# Patient Record
Sex: Female | Born: 1935 | Race: White | Hispanic: No | Smoking: Former smoker
Health system: Southern US, Community
[De-identification: ages and names within clinical notes are randomized; demographics above are authoritative.]

## PROBLEM LIST (undated history)

## (undated) DIAGNOSIS — H269 Unspecified cataract: Secondary | ICD-10-CM

## (undated) DIAGNOSIS — E785 Hyperlipidemia, unspecified: Secondary | ICD-10-CM

## (undated) DIAGNOSIS — L509 Urticaria, unspecified: Secondary | ICD-10-CM

## (undated) DIAGNOSIS — F419 Anxiety disorder, unspecified: Secondary | ICD-10-CM

## (undated) DIAGNOSIS — M1990 Unspecified osteoarthritis, unspecified site: Secondary | ICD-10-CM

## (undated) DIAGNOSIS — I10 Essential (primary) hypertension: Secondary | ICD-10-CM

## (undated) DIAGNOSIS — E039 Hypothyroidism, unspecified: Secondary | ICD-10-CM

## (undated) HISTORY — DX: Anxiety disorder, unspecified: F41.9

## (undated) HISTORY — DX: Unspecified cataract: H26.9

## (undated) HISTORY — PX: BREAST SURGERY: SHX581

## (undated) HISTORY — DX: Urticaria, unspecified: L50.9

## (undated) HISTORY — PX: EYE SURGERY: SHX253

## (undated) HISTORY — PX: ABDOMINAL HYSTERECTOMY: SHX81

## (undated) HISTORY — PX: CYSTECTOMY: SUR359

---

## 2011-09-23 ENCOUNTER — Emergency Department (HOSPITAL_COMMUNITY)
Admission: EM | Admit: 2011-09-23 | Discharge: 2011-09-24 | Disposition: A | Discharge status: Home / Self Care | Payer: Medicare Other | Attending: Emergency Medicine | Admitting: Emergency Medicine

## 2011-09-23 ENCOUNTER — Encounter (HOSPITAL_COMMUNITY): Payer: Self-pay | Admitting: *Deleted

## 2011-09-23 ENCOUNTER — Emergency Department (HOSPITAL_COMMUNITY): Payer: Medicare Other

## 2011-09-23 DIAGNOSIS — S01119A Laceration without foreign body of unspecified eyelid and periocular area, initial encounter: Secondary | ICD-10-CM | POA: Insufficient documentation

## 2011-09-23 DIAGNOSIS — W010XXA Fall on same level from slipping, tripping and stumbling without subsequent striking against object, initial encounter: Secondary | ICD-10-CM | POA: Insufficient documentation

## 2011-09-23 DIAGNOSIS — R51 Headache: Secondary | ICD-10-CM | POA: Insufficient documentation

## 2011-09-23 DIAGNOSIS — S0990XA Unspecified injury of head, initial encounter: Secondary | ICD-10-CM

## 2011-09-23 DIAGNOSIS — S0180XA Unspecified open wound of other part of head, initial encounter: Secondary | ICD-10-CM | POA: Insufficient documentation

## 2011-09-23 DIAGNOSIS — E039 Hypothyroidism, unspecified: Secondary | ICD-10-CM | POA: Insufficient documentation

## 2011-09-23 DIAGNOSIS — E785 Hyperlipidemia, unspecified: Secondary | ICD-10-CM | POA: Insufficient documentation

## 2011-09-23 DIAGNOSIS — M542 Cervicalgia: Secondary | ICD-10-CM | POA: Insufficient documentation

## 2011-09-23 DIAGNOSIS — I10 Essential (primary) hypertension: Secondary | ICD-10-CM | POA: Insufficient documentation

## 2011-09-23 DIAGNOSIS — Y92009 Unspecified place in unspecified non-institutional (private) residence as the place of occurrence of the external cause: Secondary | ICD-10-CM | POA: Insufficient documentation

## 2011-09-23 HISTORY — DX: Hypothyroidism, unspecified: E03.9

## 2011-09-23 HISTORY — DX: Essential (primary) hypertension: I10

## 2011-09-23 HISTORY — DX: Hyperlipidemia, unspecified: E78.5

## 2011-09-23 MED ORDER — ACETAMINOPHEN 325 MG PO TABS
650.0000 mg | ORAL_TABLET | Freq: Once | ORAL | Status: AC
  Administered 2011-09-23: 650 mg via ORAL
  Filled 2011-09-23: qty 2

## 2011-09-23 NOTE — ED Notes (Signed)
Pt states head starting to hurt worse, advised would speak to PA about getting something for pain.

## 2011-09-23 NOTE — ED Notes (Addendum)
Pt states tripped & hit a wall. Denies any LOC or vision changes. PERRLA. Denies any neck pain.

## 2011-09-23 NOTE — ED Notes (Addendum)
Pt report falling over a throw rug. Laceration over left eye. Bruising noted to eye.  Denies dizziness or blurred vision.

## 2011-09-26 NOTE — ED Provider Notes (Signed)
History     CSN: [id number]  Arrival date & time 09/23/11  1946   First MD Initiated Contact with Patient 09/23/11 2118      Chief Complaint  Patient presents with  . Fall    (Consider location/radiation/quality/duration/timing/severity/associated sxs/prior treatment) Patient is a 78 y.o. female presenting with fall. The history is provided by the patient.  Fall The accident occurred 1 to 2 hours ago. The fall occurred while walking. Distance fallen: She tripped over a throw rug in her home,  and a wall with her forehead. The volume of blood lost was minimal. The point of impact was the head. The pain is present in the head. The pain is at a severity of 6/10. The pain is moderate. She was ambulatory at the scene. There was no entrapment after the fall. There was no alcohol use involved in the accident. Associated symptoms include headaches. Pertinent negatives include no visual change, no fever, no numbness, no abdominal pain, no nausea, no vomiting, no loss of consciousness and no tingling. The symptoms are aggravated by pressure on the injury. She has tried ice for the symptoms. The treatment provided moderate relief.    Past Medical History  Diagnosis Date  . Hypertension   . Hypothyroid   . Hyperlipemia     Past Surgical History  Procedure Date  . Abdominal hysterectomy   . Cystectomy     History reviewed. No pertinent family history.  History  Substance Use Topics  . Smoking status: Never Smoker   . Smokeless tobacco: Not on file  . Alcohol Use: No    OB History    Grav Para Term Preterm Abortions TAB SAB Ect Mult Living                  Review of Systems  Constitutional: Negative for fever.  HENT: Negative for congestion, sore throat and neck pain.   Eyes: Negative.  Negative for pain and visual disturbance.  Respiratory: Negative for chest tightness and shortness of breath.   Cardiovascular: Negative for chest pain.  Gastrointestinal: Negative for nausea,  vomiting and abdominal pain.  Genitourinary: Negative.   Musculoskeletal: Negative for joint swelling and arthralgias.  Skin: Positive for wound. Negative for rash.  Neurological: Positive for headaches. Negative for dizziness, tingling, loss of consciousness, syncope, weakness, light-headedness and numbness.  Hematological: Negative.   Psychiatric/Behavioral: Negative.     Allergies  Review of patient's allergies indicates no known allergies.  Home Medications   Current Outpatient Rx  Name Route Sig Dispense Refill  . CALCIUM PO Oral Take 1 tablet by mouth daily.    Marland Kitchen GLUCOSAMINE HCL PO Oral Take 1 tablet by mouth daily.    Marland Kitchen LEVOTHYROXINE SODIUM 75 MCG PO TABS Oral Take 75 mcg by mouth every morning.     Marland Kitchen METOPROLOL TARTRATE 50 MG PO TABS Oral Take 50 mg by mouth 2 (two) times daily.    . ADULT MULTIVITAMIN W/MINERALS CH Oral Take 1 tablet by mouth daily.    Marland Kitchen SIMVASTATIN 40 MG PO TABS Oral Take 40 mg by mouth at bedtime.      BP 154/72  Pulse 55  Temp 97.7 F (36.5 C) (Oral)  Resp 16  Ht 5\' 3"  (1.6 m)  Wt 149 lb (67.586 kg)  BMI 26.39 kg/m2  SpO2 99%  Physical Exam  Nursing note and vitals reviewed. Constitutional: She is oriented to person, place, and time. She appears well-developed and well-nourished.  HENT:  Head: Normocephalic. Head is  with contusion and with laceration.  Right Ear: Tympanic membrane and ear canal normal. No hemotympanum.  Left Ear: Tympanic membrane and ear canal normal. No hemotympanum.  Nose: Nose normal.  Mouth/Throat: Oropharynx is clear and moist.       0.5 cm laceration left upper eyelid, hemostatic.  Left forehead hematoma with left upper eyelid edema and early ecchymosisi.  Eyes: Conjunctivae and EOM are normal. Pupils are equal, round, and reactive to light.         Visual acuity os 20/50 od 20/70  Neck: Normal range of motion. Neck supple. Muscular tenderness present. No spinous process tenderness present.  Cardiovascular: Normal  rate and normal heart sounds.   Pulmonary/Chest: Effort normal.  Abdominal: Soft. There is no tenderness.  Musculoskeletal: Normal range of motion.  Lymphadenopathy:    She has no cervical adenopathy.  Neurological: She is alert and oriented to person, place, and time. She has normal strength. No sensory deficit. Gait normal. GCS eye subscore is 4. GCS verbal subscore is 5. GCS motor subscore is 6.       Normal heel-shin, normal rapid alternating movements. Cranial nerves III-XII intact.  No pronator drift.  Skin: Skin is warm and dry. No rash noted.  Psychiatric: She has a normal mood and affect. Her speech is normal and behavior is normal. Thought content normal. Cognition and memory are normal.    ED Course  Procedures (including critical care time)  Labs Reviewed - No data to display No results found.   No results found for this or any previous visit.   1. Minor head injury   2. Laceration of eyebrow    LACERATION REPAIR Performed by: Burgess Amor Authorized by: Burgess Amor Consent: Verbal consent obtained. Risks and benefits: risks, benefits and alternatives were discussed Consent given by: patient Patient identity confirmed: provided demographic data Prepped and Draped in normal sterile fashion Wound explored  Laceration Location: left upper eyelide  Laceration Length: 0.5 cm  No Foreign Bodies seen or palpated  Anesthesia: local infiltration  Local anesthetic:n/a Anesthetic total:na Irrigation method: syringe Amount of cleaning: standard  Skin closure: dermabond  Number of sutures:dermabond Technique: Patient tolerance: Patient tolerated the procedure well with no immediate complications.  Offered pt pain medication,  Deferred.  Ice pack given while waiting for ct studies.  MDM  Ct scans reviewed prior to dc home.  Pt alert , oriented at time of dc.  Given tylenol prior to dc home.  Pt seen by Dr. Adriana Simas prior to dc.  No signs, sx suggestive of concussion  or other significant trauma from trauma.          Burgess Amor, PA 09/26/11 0932  Burgess Amor, PA 09/26/11 [phone]

## 2011-09-28 NOTE — ED Provider Notes (Signed)
Medical screening examination/treatment/procedure(s) were conducted as a shared visit with non-physician practitioner(s) and myself.  I personally evaluated the patient during the encounter.  No loss of consciousness or neurological deficits. Laceration repair per physician assistant.  Donnetta Hutching, MD 09/28/11 [phone]

## 2014-01-15 IMAGING — CT CT CERVICAL SPINE W/O CM
4 of 5 series · 13 of 33 positions shown, 15 images · non-contrast
Comparison: None

CT HEAD

CLINICAL DATA: Fall.  Bruise.  Headache and neck pain.

CT HEAD WITHOUT CONTRAST
CT CERVICAL SPINE WITHOUT CONTRAST
TECHNIQUE: Multidetector CT imaging of the head and cervical spine
was performed following the standard protocol without intravenous
contrast.  Multiplanar CT image reconstructions of the cervical
spine were also generated.

[Series 2: headseq 4.8 h37s · axial · 0.47mm/px · z∈[+217,+398]mm · 3 of 36 slices shown, 4 images]
[im 1/36  soft-tissue]
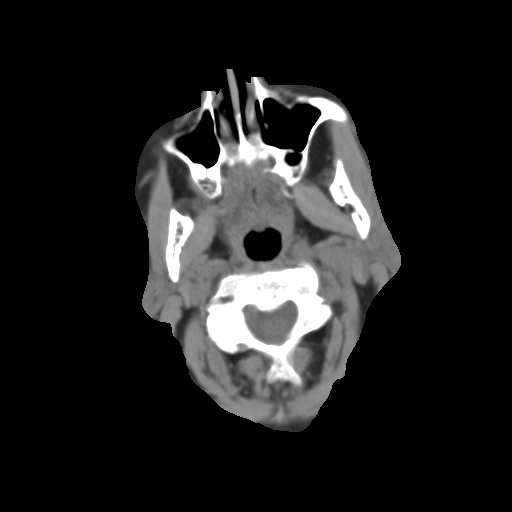
[im 1/36  bone]
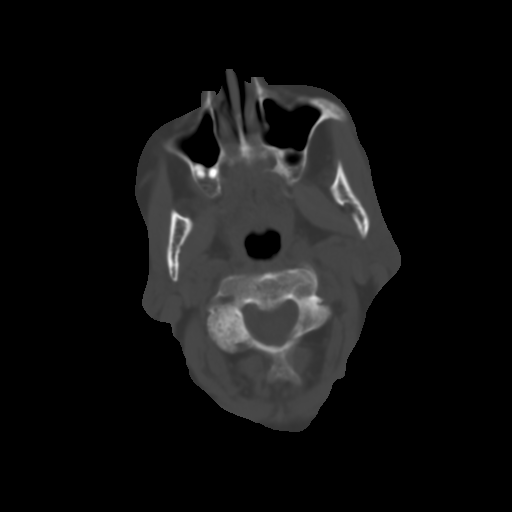
[im 18/36  bone]
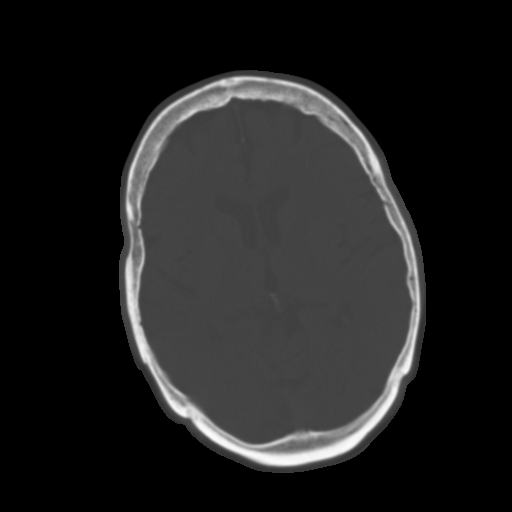
[im 36/36  bone]
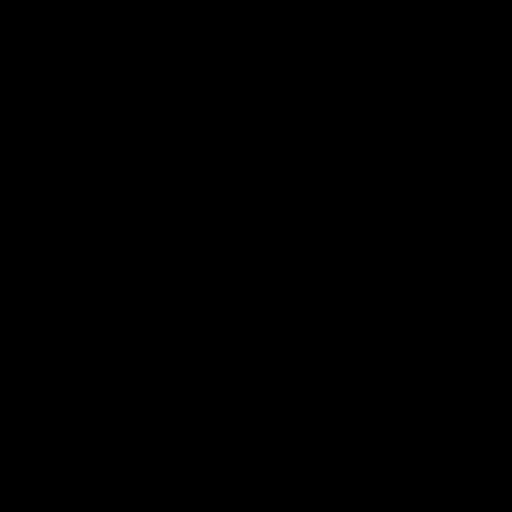

[Series 8: sagittal bone 2.0 · sagittal · 0.20mm/px · 5 of 36 slices shown, 6 images]
[im 12/36  bone]
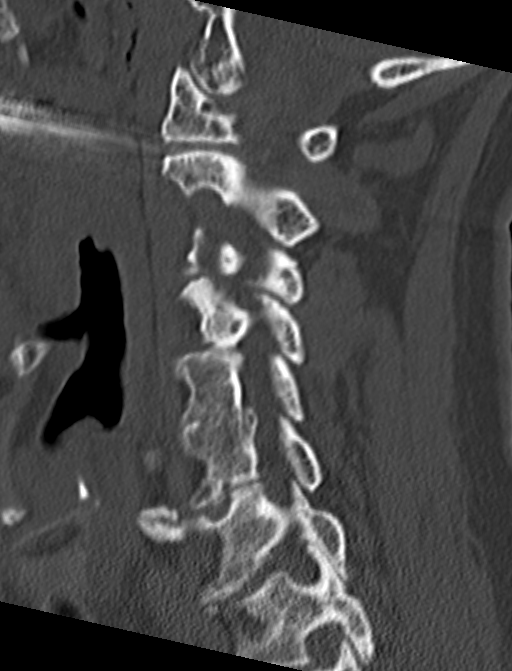
[im 15/36  bone]
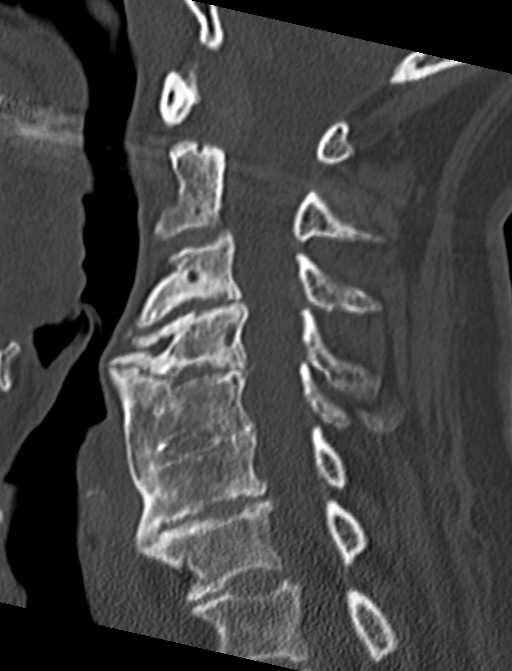
[im 18/36  soft-tissue]
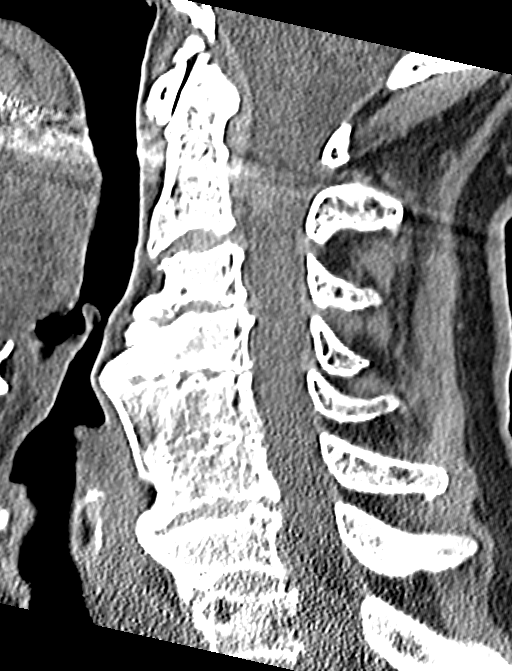
[im 18/36  bone]
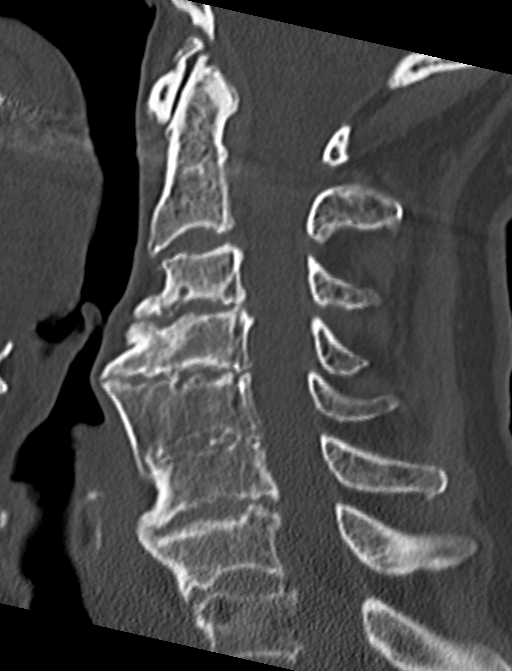
[im 21/36  bone]
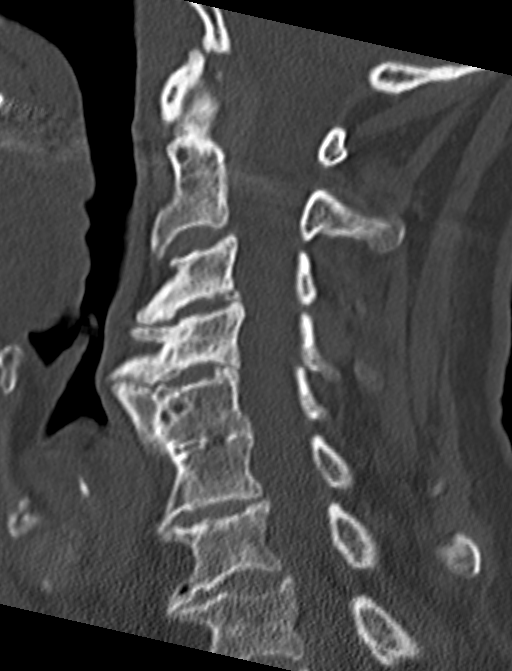
[im 24/36  bone]
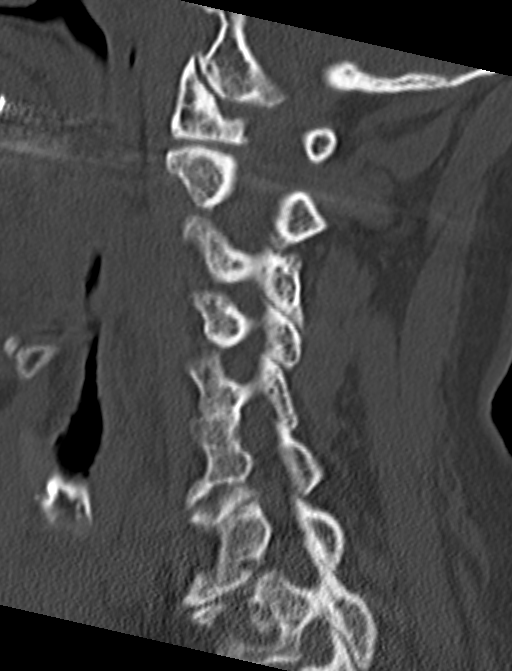

[Series 9: coronal bone 2.0 · coronal · 0.16mm/px · 3 of 51 slices shown]
[im 11/51  bone]
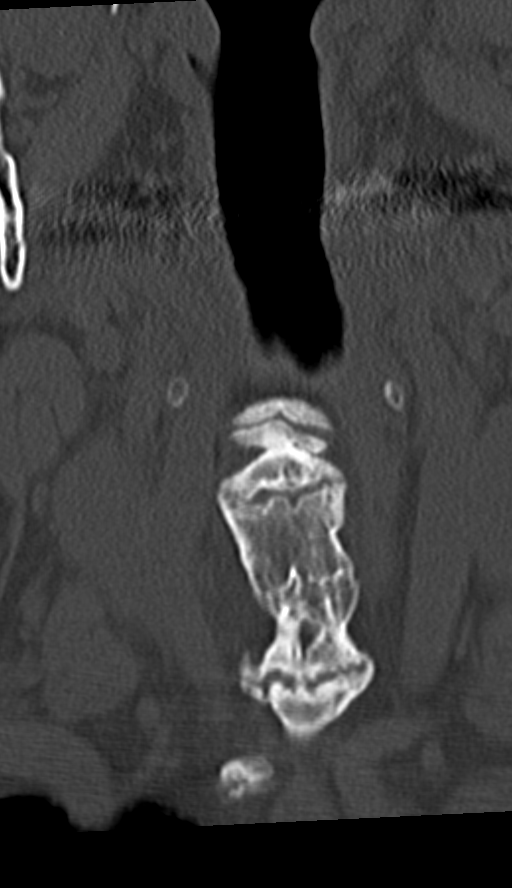
[im 21/51  bone]
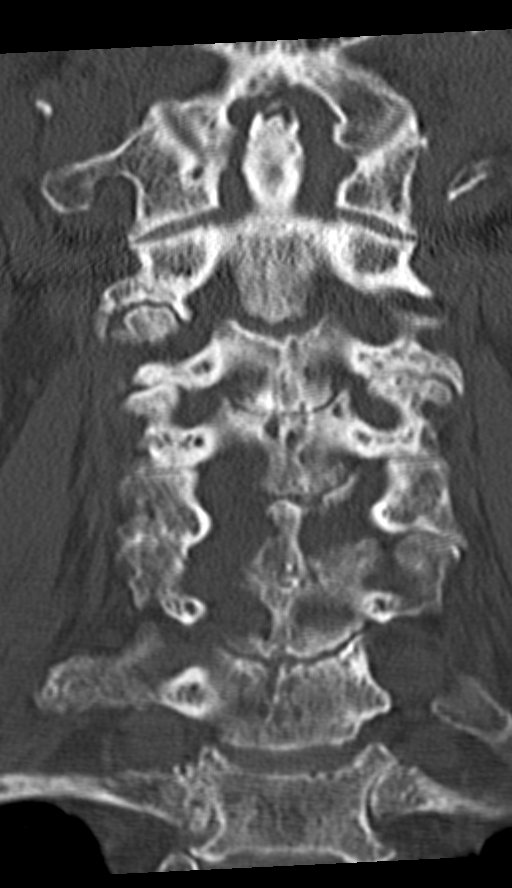
[im 31/51  bone]
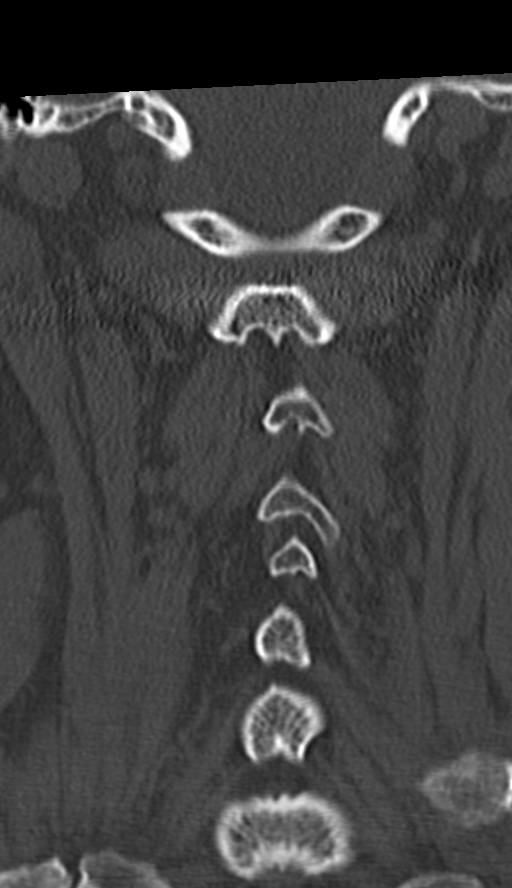

[Series 10: axial bone 2.0 · axial · 0.16mm/px · z∈[+82,+108]mm · 2 of 70 slices shown]
[im 14/70  bone]
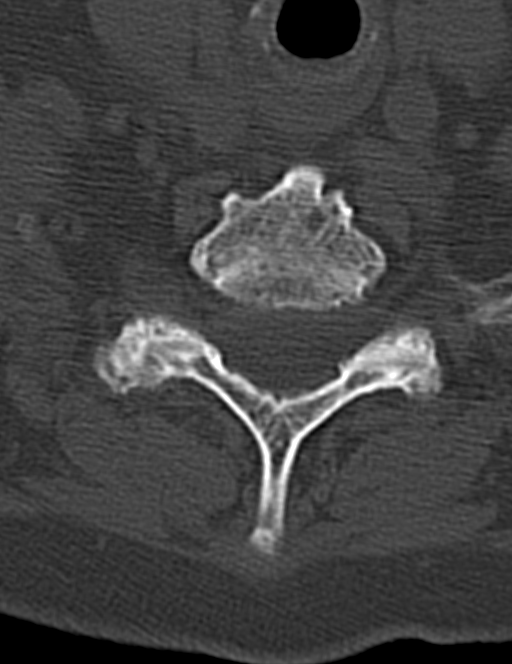
[im 28/70  bone]
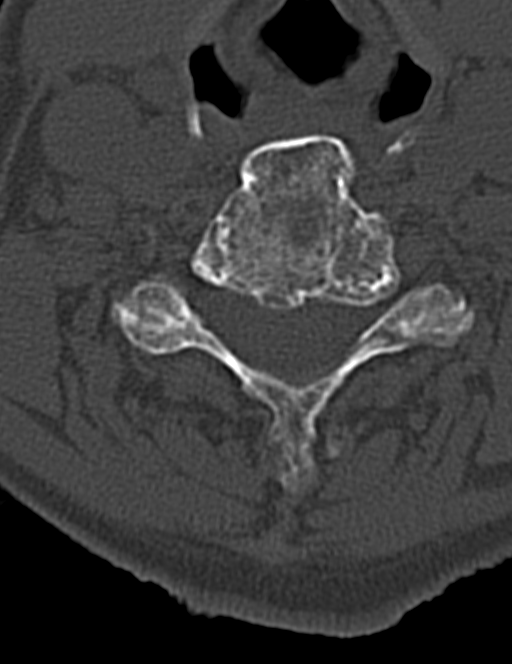

[13 of 33 positions shown; findings below may reference images not displayed]

FINDINGS: Large left orbital/preseptal and supraorbital hematoma
without underlying fracture.

The left globe appears to be grossly intact.

No intracranial hemorrhage.

Global atrophy without hydrocephalus.

Small vessel disease type changes without CT evidence of large
acute infarct.

No intracranial mass lesion detected on this unenhanced exam..

Vascular calcifications.
IMPRESSION: Large left orbital/supraorbital hematoma without underlying
fracture or intracranial hemorrhage.

CT CERVICAL SPINE
FINDINGS: No cervical spine fracture.  Prominent degenerative
changes with spinal stenosis most notable C3-4 and C6-7 with large
anterior osteophytes span between the C3 and C7.
IMPRESSION: No cervical spine fracture.  Please see above.

## 2015-05-14 ENCOUNTER — Ambulatory Visit: Payer: Self-pay | Admitting: Orthopedic Surgery

## 2015-05-21 ENCOUNTER — Other Ambulatory Visit (HOSPITAL_COMMUNITY): Payer: Self-pay | Admitting: *Deleted

## 2015-05-21 ENCOUNTER — Inpatient Hospital Stay (HOSPITAL_COMMUNITY): Admission: RE | Admit: 2015-05-21 | Payer: Self-pay | Source: Ambulatory Visit

## 2015-05-21 NOTE — Patient Instructions (Addendum)
HAWWA WESTBY  05/21/2015   Your procedure is scheduled on: Monday  05-25-15  Report to [facility] Main  Entrance take [facility]  elevators to 3rd floor to  [place] at 625 AM.  Call this number if you have problems the morning of surgery [phone]   Remember: ONLY 1 PERSON MAY GO WITH YOU TO SHORT STAY TO GET  READY MORNING OF [place].   Do not eat food or drink liquids :After Midnight.     Take these medicines the morning of surgery with A SIP OF WATER: LEVOTHYROXINE (SYNTHROID), METOPROLOL (LOPRESSOR)                               You may not have any metal on your body including hair pins and              piercings  Do not wear jewelry, make-up, lotions, powders or perfumes, deodorant             Do not wear nail polish.  Do not shave  48 hours prior to surgery.              Men may shave face and neck.   Do not bring valuables to the hospital. [place].  Contacts, dentures or bridgework may not be worn into surgery.  Leave suitcase in the car. After surgery it may be brought to your room.                  Please read over the following fact sheets you were given: breathing exercises _____________________________________________________________________             [facility] - Preparing for Surgery Before surgery, you can play an important role.  Because skin is not sterile, your skin needs to be as free of germs as possible.  You can reduce the number of germs on your skin by washing with CHG (chlorahexidine gluconate) soap before surgery.  CHG is an antiseptic cleaner which kills germs and bonds with the skin to continue killing germs even after washing. Please DO NOT use if you have an allergy to CHG or antibacterial soaps.  If your skin becomes reddened/irritated stop using the CHG and inform your nurse when you arrive at Short Stay. Do not shave (including legs and underarms) for at least  48 hours prior to the first CHG shower.  You may shave your face/neck. Please follow these instructions carefully:  1.  Shower with CHG Soap the night before surgery and the  morning of Surgery.  2.  If you choose to wash your hair, wash your hair first as usual with your  normal  shampoo.  3.  After you shampoo, rinse your hair and body thoroughly to remove the  shampoo.                           4.  Use CHG as you would any other liquid soap.  You can apply chg directly  to the skin and wash                       Gently with a scrungie or clean washcloth.  5.  Apply the CHG Soap to your body ONLY FROM THE NECK DOWN.   Do not use on face/ open                           Wound or open sores. Avoid contact with eyes, ears mouth and genitals (private parts).                       Wash face,  Genitals (private parts) with your normal soap.             6.  Wash thoroughly, paying special attention to the area where your surgery  will be performed.  7.  Thoroughly rinse your body with warm water from the neck down.  8.  DO NOT shower/wash with your normal soap after using and rinsing off  the CHG Soap.                9.  Pat yourself dry with a clean towel.            10.  Wear clean pajamas.            11.  Place clean sheets on your bed the night of your first shower and do not  sleep with pets. Day of Surgery : Do not apply any lotions/deodorants the morning of surgery.  Please wear clean clothes to the hospital/surgery center.  FAILURE TO FOLLOW THESE INSTRUCTIONS MAY RESULT IN THE CANCELLATION OF YOUR SURGERY PATIENT SIGNATURE_________________________________  NURSE SIGNATURE__________________________________  ________________________________________________________________________   Adam Phenix  An incentive spirometer is a tool that can help keep your lungs clear and active. This tool measures how well you are filling your lungs with each breath. Taking long deep breaths may  help reverse or decrease the chance of developing breathing (pulmonary) problems (especially infection) following:  A long period of time when you are unable to move or be active. BEFORE THE PROCEDURE   If the spirometer includes an indicator to show your best effort, your nurse or respiratory therapist will set it to a desired goal.  If possible, sit up straight or lean slightly forward. Try not to slouch.  Hold the incentive spirometer in an upright position. INSTRUCTIONS FOR USE   Sit on the edge of your bed if possible, or sit up as far as you can in bed or on a chair.  Hold the incentive spirometer in an upright position.  Breathe out normally.  Place the mouthpiece in your mouth and seal your lips tightly around it.  Breathe in slowly and as deeply as possible, raising the piston or the ball toward the top of the column.  Hold your breath for 3-5 seconds or for as long as possible. Allow the piston or ball to fall to the bottom of the column.  Remove the mouthpiece from your mouth and breathe out normally.  Rest for a few seconds and repeat Steps 1 through 7 at least 10 times every 1-2 hours when you are awake. Take your time and take a few normal breaths between deep breaths.  The spirometer may include an indicator to show your best effort. Use the indicator as a goal to work toward during each repetition.  After each set of 10 deep breaths, practice coughing to be sure your lungs are clear. If you have an incision (the cut made at the time of surgery), support your incision when coughing by placing a  pillow or rolled up towels firmly against it. Once you are able to get out of bed, walk around indoors and cough well. You may stop using the incentive spirometer when instructed by your caregiver.  RISKS AND COMPLICATIONS  Take your time so you do not get dizzy or light-headed.  If you are in pain, you may need to take or ask for pain medication before doing incentive  spirometry. It is harder to take a deep breath if you are having pain. AFTER USE  Rest and breathe slowly and easily.  It can be helpful to keep track of a log of your progress. Your caregiver can provide you with a simple table to help with this. If you are using the spirometer at home, follow these instructions: [place] IF:   You are having difficultly using the spirometer.  You have trouble using the spirometer as often as instructed.  Your pain medication is not giving enough relief while using the spirometer.  You develop fever of 100.5 F (38.1 C) or higher. SEEK IMMEDIATE MEDICAL CARE IF:   You cough up bloody sputum that had not been present before.  You develop fever of 102 F (38.9 C) or greater.  You develop worsening pain at or near the incision site. MAKE SURE YOU:   Understand these instructions.  Will watch your condition.  Will get help right away if you are not doing well or get worse. Document Released: 07/11/2006 Document Revised: 05/23/2011 Document Reviewed: 09/11/2006 Exit[employer] Patient Information 2014 [employer], [place].   ________________________________________________________________________  WHAT IS A BLOOD TRANSFUSION? Blood Transfusion Information  A transfusion is the replacement of blood or some of its parts. Blood is made up of multiple cells which provide different functions.  Red blood cells carry oxygen and are used for blood loss replacement.  White blood cells fight against infection.  Platelets control bleeding.  Plasma helps clot blood.  Other blood products are available for specialized needs, such as hemophilia or other clotting disorders. BEFORE THE TRANSFUSION  Who gives blood for transfusions?   Healthy volunteers who are fully evaluated to make sure their blood is safe. This is blood bank blood. Transfusion therapy is the safest it has ever been in the practice of medicine. Before blood is taken from a donor, a  complete history is taken to make sure that person has no history of diseases nor engages in risky social behavior (examples are intravenous drug use or sexual activity with multiple partners). The donor's travel history is screened to minimize risk of transmitting infections, such as malaria. The donated blood is tested for signs of infectious diseases, such as HIV and hepatitis. The blood is then tested to be sure it is compatible with you in order to minimize the chance of a transfusion reaction. If you or a relative donates blood, this is often done in anticipation of surgery and is not appropriate for emergency situations. It takes many days to process the donated blood. RISKS AND COMPLICATIONS Although transfusion therapy is very safe and saves many lives, the main dangers of transfusion include:   Getting an infectious disease.  Developing a transfusion reaction. This is an allergic reaction to something in the blood you were given. Every precaution is taken to prevent this. The decision to have a blood transfusion has been considered carefully by your caregiver before blood is given. Blood is not given unless the benefits outweigh the risks. AFTER THE TRANSFUSION  Right after receiving a blood transfusion, you  will usually feel much better and more energetic. This is especially true if your red blood cells have gotten low (anemic). The transfusion raises the level of the red blood cells which carry oxygen, and this usually causes an energy increase.  The nurse administering the transfusion will monitor you carefully for complications. HOME CARE INSTRUCTIONS  No special instructions are needed after a transfusion. You may find your energy is better. Speak with your caregiver about any limitations on activity for underlying diseases you may have. SEEK MEDICAL CARE IF:   Your condition is not improving after your transfusion.  You develop redness or irritation at the intravenous (IV)  site. SEEK IMMEDIATE MEDICAL CARE IF:  Any of the following symptoms occur over the next 12 hours:  Shaking chills.  You have a temperature by mouth above 102 F (38.9 C), not controlled by medicine.  Chest, back, or muscle pain.  People around you feel you are not acting correctly or are confused.  Shortness of breath or difficulty breathing.  Dizziness and fainting.  You get a rash or develop hives.  You have a decrease in urine output.  Your urine turns a dark color or changes to pink, red, or brown. Any of the following symptoms occur over the next 10 days:  You have a temperature by mouth above 102 F (38.9 C), not controlled by medicine.  Shortness of breath.  Weakness after normal activity.  The white part of the eye turns yellow (jaundice).  You have a decrease in the amount of urine or are urinating less often.  Your urine turns a dark color or changes to pink, red, or brown. Document Released: 02/26/2000 Document Revised: 05/23/2011 Document Reviewed: 10/15/2007 [facility] Patient Information 2014 [place], [place].  _______________________________________________________________________

## 2015-05-22 ENCOUNTER — Encounter (HOSPITAL_COMMUNITY): Payer: Self-pay

## 2015-05-22 ENCOUNTER — Encounter (HOSPITAL_COMMUNITY)
Admission: RE | Admit: 2015-05-22 | Discharge: 2015-05-22 | Disposition: A | Discharge status: Home / Self Care | Payer: Medicare Other | Source: Ambulatory Visit | Attending: Orthopedic Surgery | Admitting: Orthopedic Surgery

## 2015-05-22 HISTORY — DX: Unspecified osteoarthritis, unspecified site: M19.90

## 2015-05-22 LAB — COMPREHENSIVE METABOLIC PANEL
ALBUMIN: 4.1 g/dL (ref 3.5–5.0)
ALK PHOS: 66 U/L (ref 38–126)
ALT: 15 U/L (ref 14–54)
AST: 23 U/L (ref 15–41)
Anion gap: 8 (ref 5–15)
BILIRUBIN TOTAL: 0.4 mg/dL (ref 0.3–1.2)
BUN: 14 mg/dL (ref 6–20)
CO2: 27 mmol/L (ref 22–32)
CREATININE: 0.9 mg/dL (ref 0.44–1.00)
Calcium: 9.1 mg/dL (ref 8.9–10.3)
Chloride: 104 mmol/L (ref 101–111)
GFR calc Af Amer: >60 mL/min (ref >60)
GFR calc non Af Amer: 56 mL/min — ABNORMAL LOW (ref >60)
GLUCOSE: 79 mg/dL (ref 65–99)
POTASSIUM: 4.6 mmol/L (ref 3.5–5.1)
Sodium: 139 mmol/L (ref 135–145)
TOTAL PROTEIN: 7.1 g/dL (ref 6.5–8.1)

## 2015-05-22 LAB — PROTIME-INR
INR: 1.03 (ref 0.00–1.49)
Prothrombin Time: 13.3 seconds (ref 11.6–15.2)

## 2015-05-22 LAB — SURGICAL PCR SCREEN
MRSA, PCR: NEGATIVE
Staphylococcus aureus: NEGATIVE

## 2015-05-22 LAB — CBC
HEMATOCRIT: 41.3 % (ref 36.0–46.0)
HEMOGLOBIN: 13.2 g/dL (ref 12.0–15.0)
MCH: 30.3 pg (ref 26.0–34.0)
MCHC: 32 g/dL (ref 30.0–36.0)
MCV: 94.7 fL (ref 78.0–100.0)
Platelets: 337 10*3/uL (ref 150–400)
RBC: 4.36 MIL/uL (ref 3.87–5.11)
RDW: 14.1 % (ref 11.5–15.5)
WBC: 5.9 10*3/uL (ref 4.0–10.5)

## 2015-05-22 LAB — URINALYSIS, ROUTINE W REFLEX MICROSCOPIC
BILIRUBIN URINE: NEGATIVE
Glucose, UA: NEGATIVE mg/dL
Hgb urine dipstick: NEGATIVE
KETONES UR: NEGATIVE mg/dL
NITRITE: NEGATIVE
Protein, ur: NEGATIVE mg/dL
SPECIFIC GRAVITY, URINE: 1.007 (ref 1.005–1.030)
pH: 7 (ref 5.0–8.0)

## 2015-05-22 LAB — ABO/RH: ABO/RH(D): A POS

## 2015-05-22 LAB — URINE MICROSCOPIC-ADD ON

## 2015-05-22 LAB — APTT: APTT: 30 s (ref 24–37)

## 2015-05-22 NOTE — Progress Notes (Signed)
Spoke with Dr. Wynelle Link in person concerning lab results for U/A and micro U/A and he wrote on lab results-No Action!

## 2015-05-24 ENCOUNTER — Ambulatory Visit: Payer: Self-pay | Admitting: Orthopedic Surgery

## 2015-05-24 NOTE — H&P (Signed)
Patricia Morton DOB: 1926-08-01 Widowed / Language: Cleophus Molt / Race: White Female Date of Admission:  05/25/2015  CC:  Right Knee Pain History of Present Illness  The patient is a 82 year old female who comes in for a preoperative History and Physical. The patient is scheduled for a right total knee arthroplasty to be performed by Dr. Dione Plover. Aluisio, MD at [facility] on 05-25-2015. The patient is a 82 year old female who presented with knee complaints. The patient was seen for a second opinion. The patient reports right knee symptoms including: pain, swelling, giving way, weakness and stiffness which began year(s) (worse within the past year) ago without any known injury. The patient describes the severity of the symptoms as severe. The patient describes their pain as sharp.The patient feels that the symptoms are worsening. Prior to being seen, the patient was previously evaluated by a colleague. Previous work-up for this problem has included knee x-rays. Past treatment for this problem has included intra-articular injection of corticosteroids (finished Euflexxa series in both knees on 11/05/14). and include knee pain, medial knee tenderness, swelling, instability, difficulty bearing weight and difficulty ambulating. The patient does not report any radiation of symptoms. Onset of symptoms was gradual with symptoms now occurring frequently. Symptoms are exacerbated by motion at the knee, weight bearing, walking, kneeling and squatting. Symptoms are relieved by rest. The patient is not currently being treated for this problem. Patient states she takes tramadol for pain. I previously did the knee replacement on her husband, Thayer Jew, many years ago. She was very pleased with how his outcome was. Unfortunately, her right knee is getting progressively worse and worse over time. It is at a stage where it is hurting her all the time. It is definitely limiting what she can and cannot do. She cannot trust the knee  and feels as though it is going to give out on her. She has not had any falls, but has come close. She has been treated by Dr. Durward Fortes most recently with a series of Euflexxa injections. Unfortunately, did not help. She is now ready to proceed with surgery for her knee. They have been treated conservatively in the past for the above stated problem and despite conservative measures, they continue to have progressive pain and severe functional limitations and dysfunction. They have failed non-operative management including home exercise, medications, and injections. It is felt that they would benefit from undergoing total joint replacement. Risks and benefits of the procedure have been discussed with the patient and they elect to proceed with surgery. There are no active contraindications to surgery such as ongoing infection or rapidly progressive neurological disease.  Problem List/Past Medical  Primary osteoarthritis of right knee (M17.11)  Depression  High blood pressure  Hypercholesterolemia  Hypothyroidism  Osteoarthritis  Vertigo  Menopause  Allergies No Known Drug Allergies   Family History  Cancer  Maternal Grandmother, Mother. Congestive Heart Failure  Paternal Grandfather. Depression  Brother, Sister. Drug / Alcohol Addiction  Brother, Father, Paternal Grandfather. Heart Disease  Brother, Paternal 51, Sister. Hypertension  Sister. Liver Disease, Chronic  Father. Osteoarthritis  Brother, Sister.  Social History  Children  2 Current drinker  02/25/2015: Currently drinks wine only occasionally per week Current work status  retired Exercise  Exercises rarely Living situation  live alone Marital status  widowed No history of drug/alcohol rehab  Not under pain contract  Number of flights of stairs before winded  2-3 Tobacco / smoke exposure  02/25/2015: no Tobacco use  Former smoker. 02/25/2015: smoke(d) less than 1/2 pack(s) per  day [place] with Family Advance Directives  Living Will, Healthcare POA  Medication History  TraMADol HCl (50MG  Tablet, 1-2 Tablet Oral every 6-8 hours as needed for pain, Taken starting 02/26/2015) Active. Triamterene-HCTZ (37.5-25MG  Tablet, Oral) Active. Simvastatin (40MG  Tablet, Oral) Active. Levothyroxine Sodium (75MCG Tablet, Oral) Active. Metoprolol Tartrate (50MG  Tablet, Oral) Active. LORazepam (1MG  Tablet, Oral) Active. Gabapentin (300MG  Capsule, Oral) Active.  Past Surgical History Breast Biopsy  Date: 43. right Hysterectomy  Date: 68. complete (non-cancerous)    Review of Systems General Present- Night Sweats. Not Present- Chills, Fatigue, Fever, Memory Loss, Weight Gain and Weight Loss. Skin Not Present- Eczema, Hives, Itching, Lesions and Rash. HEENT Present- Tinnitus. Not Present- Dentures, Double Vision, Headache, Hearing Loss and Visual Loss. Respiratory Not Present- Allergies, Chronic Cough, Coughing up blood, Shortness of breath at rest and Shortness of breath with exertion. Cardiovascular Not Present- Chest Pain, Difficulty Breathing Lying Down, Murmur, Palpitations, Racing/skipping heartbeats and Swelling. Gastrointestinal Present- Loss of appetitie. Not Present- Abdominal Pain, Bloody Stool, Constipation, Diarrhea, Difficulty Swallowing, Heartburn, Jaundice, Nausea and Vomiting. Female Genitourinary Present- Urinating at Night. Not Present- Blood in Urine, Discharge, Flank Pain, Incontinence, Painful Urination, Urgency, Urinary frequency, Urinary Retention and Weak urinary stream. Musculoskeletal Present- Joint Pain and Joint Swelling. Not Present- Back Pain, Morning Stiffness, Muscle Pain, Muscle Weakness and Spasms. Neurological Not Present- Blackout spells, Difficulty with balance, Dizziness, Paralysis, Tremor and Weakness. Psychiatric Not Present- Insomnia.  Vitals Weight: 149 lb Height: 63in Body Surface Area: 1.71 m  Body Mass Index: 26.39 kg/m  BP: 118/82 (Sitting, Right Arm, Standard)   Physical Exam General Mental Status -Alert, cooperative and good historian. General Appearance-pleasant, Not in acute distress. Orientation-Oriented X3. Build & Nutrition-Well nourished and Well developed.  Head and Neck Head-normocephalic, atraumatic . Neck Global Assessment - supple, no bruit auscultated on the right, no bruit auscultated on the left.  Eye Pupil - Bilateral-Regular and Round. Motion - Bilateral-EOMI.  Chest and Lung Exam Auscultation Breath sounds - clear at anterior chest wall and clear at posterior chest wall. Adventitious sounds - No Adventitious sounds.  Cardiovascular Auscultation Rhythm - Regular rate and rhythm. Heart Sounds - S1 WNL and S2 WNL. Murmurs & Other Heart Sounds - Auscultation of the heart reveals - No Murmurs.  Abdomen Palpation/Percussion Tenderness - Abdomen is non-tender to palpation. Rigidity (guarding) - Abdomen is soft. Auscultation Auscultation of the abdomen reveals - Bowel sounds normal.  Female Genitourinary Note: Not done, not pertinent to present illness   Musculoskeletal Note: Evaluation of her hip show normal motion of discomfort. Her left knee shows no effusion. Ranged about 0 to 125. No tenderness or instability. Right knee, no effusion. Varus deformity. Ranged about 5 to 120. Marked crepitus on range of motion. Tenderness medial greater than lateral with no instability noted. Pulse, sensation, motor intact.  RADIOGRAPHS AP and lateral of the knees show that she has bone-on-bone arthritis, medial and patellofemoral compartments of the right knee with varus deformity.   Assessment & Plan  Primary osteoarthritis of right knee (M17.11)  Note:Surgical Plans: Right Total Knee Replacement  Disposition: Home with family  PCP: Dr. Scotty Court - Patient has been seen preoperatively and felt to be stable for surgery.  IV  TXA  Anesthesia Issues: None  Signed electronically by Ok Edwards, III PA-C

## 2015-05-25 ENCOUNTER — Inpatient Hospital Stay (HOSPITAL_COMMUNITY): Payer: Medicare Other | Admitting: Anesthesiology

## 2015-05-25 ENCOUNTER — Encounter (HOSPITAL_COMMUNITY)
Admission: RE | Disposition: A | Discharge status: Home Health | Payer: Self-pay | Source: Ambulatory Visit | Attending: Orthopedic Surgery

## 2015-05-25 ENCOUNTER — Inpatient Hospital Stay (HOSPITAL_COMMUNITY)
Admission: RE | Admit: 2015-05-25 | Discharge: 2015-05-27 | DRG: 470 | Disposition: A | Discharge status: Home Health | Payer: Medicare Other | Source: Ambulatory Visit | Attending: Orthopedic Surgery | Admitting: Orthopedic Surgery

## 2015-05-25 ENCOUNTER — Encounter (HOSPITAL_COMMUNITY): Payer: Self-pay | Admitting: *Deleted

## 2015-05-25 DIAGNOSIS — Z79899 Other long term (current) drug therapy: Secondary | ICD-10-CM

## 2015-05-25 DIAGNOSIS — M1711 Unilateral primary osteoarthritis, right knee: Secondary | ICD-10-CM | POA: Diagnosis present

## 2015-05-25 DIAGNOSIS — Z01812 Encounter for preprocedural laboratory examination: Secondary | ICD-10-CM | POA: Diagnosis not present

## 2015-05-25 DIAGNOSIS — E7800 Pure hypercholesterolemia, unspecified: Secondary | ICD-10-CM | POA: Diagnosis present

## 2015-05-25 DIAGNOSIS — M179 Osteoarthritis of knee, unspecified: Secondary | ICD-10-CM | POA: Diagnosis present

## 2015-05-25 DIAGNOSIS — F329 Major depressive disorder, single episode, unspecified: Secondary | ICD-10-CM | POA: Diagnosis present

## 2015-05-25 DIAGNOSIS — M25561 Pain in right knee: Secondary | ICD-10-CM | POA: Diagnosis present

## 2015-05-25 DIAGNOSIS — I10 Essential (primary) hypertension: Secondary | ICD-10-CM | POA: Diagnosis present

## 2015-05-25 DIAGNOSIS — Z87891 Personal history of nicotine dependence: Secondary | ICD-10-CM | POA: Diagnosis not present

## 2015-05-25 DIAGNOSIS — M1710 Unilateral primary osteoarthritis, unspecified knee: Secondary | ICD-10-CM | POA: Diagnosis present

## 2015-05-25 DIAGNOSIS — E039 Hypothyroidism, unspecified: Secondary | ICD-10-CM | POA: Diagnosis present

## 2015-05-25 HISTORY — PX: TOTAL KNEE ARTHROPLASTY: SHX125

## 2015-05-25 LAB — TYPE AND SCREEN
ABO/RH(D): A POS
ANTIBODY SCREEN: NEGATIVE

## 2015-05-25 SURGERY — ARTHROPLASTY, KNEE, TOTAL
Anesthesia: Spinal | Laterality: Right

## 2015-05-25 MED ORDER — PROPOFOL 10 MG/ML IV BOLUS
INTRAVENOUS | Status: DC | PRN
  Administered 2015-05-25: 10 mg via INTRAVENOUS
  Administered 2015-05-25: 20 mg via INTRAVENOUS

## 2015-05-25 MED ORDER — TRAMADOL HCL 50 MG PO TABS: 50.0000 mg | ORAL_TABLET | Freq: Four times a day (QID) | ORAL | Status: DC | PRN

## 2015-05-25 MED ORDER — NON FORMULARY
Status: DC | PRN
  Administered 2015-05-25: 3000 mg via TOPICAL

## 2015-05-25 MED ORDER — PHENYLEPHRINE 40 MCG/ML (10ML) SYRINGE FOR IV PUSH (FOR BLOOD PRESSURE SUPPORT)
PREFILLED_SYRINGE | INTRAVENOUS | Status: AC
  Filled 2015-05-25: qty 10

## 2015-05-25 MED ORDER — FLEET ENEMA 7-19 GM/118ML RE ENEM: 1.0000 | ENEMA | Freq: Once | RECTAL | Status: DC | PRN

## 2015-05-25 MED ORDER — CEFAZOLIN SODIUM-DEXTROSE 2-3 GM-% IV SOLR
2.0000 g | Freq: Four times a day (QID) | INTRAVENOUS | Status: AC
  Administered 2015-05-25 (×2): 2 g via INTRAVENOUS
  Filled 2015-05-25 (×2): qty 50

## 2015-05-25 MED ORDER — LEVOTHYROXINE SODIUM 75 MCG PO TABS
75.0000 ug | ORAL_TABLET | Freq: Every day | ORAL | Status: DC
  Administered 2015-05-26 – 2015-05-27 (×2): 75 ug via ORAL
  Filled 2015-05-25 (×3): qty 1

## 2015-05-25 MED ORDER — BUPIVACAINE HCL 0.25 % IJ SOLN
INTRAMUSCULAR | Status: DC | PRN
  Administered 2015-05-25: 20 mL

## 2015-05-25 MED ORDER — ACETAMINOPHEN 10 MG/ML IV SOLN
INTRAVENOUS | Status: AC
  Filled 2015-05-25: qty 100

## 2015-05-25 MED ORDER — METOPROLOL TARTRATE 50 MG PO TABS
50.0000 mg | ORAL_TABLET | Freq: Two times a day (BID) | ORAL | Status: DC
  Administered 2015-05-26 – 2015-05-27 (×3): 50 mg via ORAL
  Filled 2015-05-25 (×5): qty 1

## 2015-05-25 MED ORDER — ACETAMINOPHEN 500 MG PO TABS
1000.0000 mg | ORAL_TABLET | Freq: Four times a day (QID) | ORAL | Status: AC
Start: 2015-05-25 — End: 2015-05-26
  Administered 2015-05-25 – 2015-05-26 (×4): 1000 mg via ORAL
  Filled 2015-05-25 (×4): qty 2

## 2015-05-25 MED ORDER — ONDANSETRON HCL 4 MG PO TABS: 4.0000 mg | ORAL_TABLET | Freq: Four times a day (QID) | ORAL | Status: DC | PRN

## 2015-05-25 MED ORDER — OXYCODONE HCL 5 MG/5ML PO SOLN
5.0000 mg | Freq: Once | ORAL | Status: DC | PRN
  Filled 2015-05-25: qty 5

## 2015-05-25 MED ORDER — PHENYLEPHRINE HCL 10 MG/ML IJ SOLN
INTRAMUSCULAR | Status: DC | PRN
  Administered 2015-05-25: 40 ug via INTRAVENOUS

## 2015-05-25 MED ORDER — METHOCARBAMOL 1000 MG/10ML IJ SOLN
500.0000 mg | Freq: Four times a day (QID) | INTRAVENOUS | Status: DC | PRN
  Administered 2015-05-25: 500 mg via INTRAVENOUS
  Filled 2015-05-25 (×2): qty 5

## 2015-05-25 MED ORDER — ACETAMINOPHEN 650 MG RE SUPP: 650.0000 mg | Freq: Four times a day (QID) | RECTAL | Status: DC | PRN

## 2015-05-25 MED ORDER — FENTANYL CITRATE (PF) 100 MCG/2ML IJ SOLN
INTRAMUSCULAR | Status: AC
  Filled 2015-05-25: qty 2

## 2015-05-25 MED ORDER — TRIAMTERENE-HCTZ 37.5-25 MG PO TABS
0.5000 | ORAL_TABLET | Freq: Every morning | ORAL | Status: DC
  Administered 2015-05-26 – 2015-05-27 (×2): 0.5 via ORAL
  Filled 2015-05-25 (×2): qty 0.5

## 2015-05-25 MED ORDER — DOCUSATE SODIUM 100 MG PO CAPS
100.0000 mg | ORAL_CAPSULE | Freq: Two times a day (BID) | ORAL | Status: DC
  Administered 2015-05-25 – 2015-05-27 (×4): 100 mg via ORAL

## 2015-05-25 MED ORDER — GABAPENTIN 300 MG PO CAPS
300.0000 mg | ORAL_CAPSULE | Freq: Every day | ORAL | Status: DC
  Administered 2015-05-25 – 2015-05-26 (×2): 300 mg via ORAL
  Filled 2015-05-25 (×3): qty 1

## 2015-05-25 MED ORDER — PROPOFOL 10 MG/ML IV BOLUS
INTRAVENOUS | Status: AC
  Filled 2015-05-25: qty 20

## 2015-05-25 MED ORDER — METHOCARBAMOL 500 MG PO TABS
500.0000 mg | ORAL_TABLET | Freq: Four times a day (QID) | ORAL | Status: DC | PRN
  Administered 2015-05-26 – 2015-05-27 (×2): 500 mg via ORAL
  Filled 2015-05-25 (×2): qty 1

## 2015-05-25 MED ORDER — SODIUM CHLORIDE 0.9 % IV SOLN: INTRAVENOUS | Status: DC

## 2015-05-25 MED ORDER — BUPIVACAINE LIPOSOME 1.3 % IJ SUSP
20.0000 mL | Freq: Once | INTRAMUSCULAR | Status: DC
  Filled 2015-05-25: qty 20

## 2015-05-25 MED ORDER — POLYETHYLENE GLYCOL 3350 17 G PO PACK: 17.0000 g | PACK | Freq: Every day | ORAL | Status: DC | PRN

## 2015-05-25 MED ORDER — BISACODYL 10 MG RE SUPP: 10.0000 mg | Freq: Every day | RECTAL | Status: DC | PRN

## 2015-05-25 MED ORDER — CEFAZOLIN SODIUM-DEXTROSE 2-3 GM-% IV SOLR
2.0000 g | INTRAVENOUS | Status: AC
Start: 2015-05-25 — End: 2015-05-25
  Administered 2015-05-25: 2 g via INTRAVENOUS

## 2015-05-25 MED ORDER — SIMVASTATIN 40 MG PO TABS
40.0000 mg | ORAL_TABLET | Freq: Every day | ORAL | Status: DC
  Administered 2015-05-25 – 2015-05-26 (×2): 40 mg via ORAL
  Filled 2015-05-25 (×3): qty 1

## 2015-05-25 MED ORDER — ACETAMINOPHEN 10 MG/ML IV SOLN
1000.0000 mg | Freq: Once | INTRAVENOUS | Status: AC
  Administered 2015-05-25: 1000 mg via INTRAVENOUS

## 2015-05-25 MED ORDER — LACTATED RINGERS IV SOLN
INTRAVENOUS | Status: DC | PRN
  Administered 2015-05-25 (×2): via INTRAVENOUS

## 2015-05-25 MED ORDER — DEXAMETHASONE SODIUM PHOSPHATE 10 MG/ML IJ SOLN
INTRAMUSCULAR | Status: AC
  Filled 2015-05-25: qty 1

## 2015-05-25 MED ORDER — TRANEXAMIC ACID 1000 MG/10ML IV SOLN
1000.0000 mg | Freq: Once | INTRAVENOUS | Status: AC
  Administered 2015-05-25: 1000 mg via INTRAVENOUS
  Filled 2015-05-25: qty 10

## 2015-05-25 MED ORDER — BUPIVACAINE HCL (PF) 0.25 % IJ SOLN
INTRAMUSCULAR | Status: AC
  Filled 2015-05-25: qty 30

## 2015-05-25 MED ORDER — RIVAROXABAN 10 MG PO TABS
10.0000 mg | ORAL_TABLET | Freq: Every day | ORAL | Status: DC
  Administered 2015-05-26 – 2015-05-27 (×2): 10 mg via ORAL
  Filled 2015-05-25 (×3): qty 1

## 2015-05-25 MED ORDER — TRANEXAMIC ACID 1000 MG/10ML IV SOLN
1000.0000 mg | INTRAVENOUS | Status: AC
  Administered 2015-05-25: 1000 mg via INTRAVENOUS
  Filled 2015-05-25: qty 10

## 2015-05-25 MED ORDER — ACETAMINOPHEN 325 MG PO TABS: 650.0000 mg | ORAL_TABLET | Freq: Four times a day (QID) | ORAL | Status: DC | PRN

## 2015-05-25 MED ORDER — LORAZEPAM 1 MG PO TABS
1.0000 mg | ORAL_TABLET | Freq: Every day | ORAL | Status: DC
  Administered 2015-05-25 – 2015-05-26 (×2): 1 mg via ORAL
  Filled 2015-05-25 (×2): qty 1

## 2015-05-25 MED ORDER — DEXAMETHASONE SODIUM PHOSPHATE 10 MG/ML IJ SOLN
10.0000 mg | Freq: Once | INTRAMUSCULAR | Status: AC
  Administered 2015-05-26: 10 mg via INTRAVENOUS
  Filled 2015-05-25: qty 1

## 2015-05-25 MED ORDER — CEFAZOLIN SODIUM-DEXTROSE 2-3 GM-% IV SOLR
INTRAVENOUS | Status: AC
  Filled 2015-05-25: qty 50

## 2015-05-25 MED ORDER — OXYCODONE HCL 5 MG PO TABS: 5.0000 mg | ORAL_TABLET | Freq: Once | ORAL | Status: DC | PRN

## 2015-05-25 MED ORDER — FENTANYL CITRATE (PF) 100 MCG/2ML IJ SOLN
INTRAMUSCULAR | Status: DC | PRN
  Administered 2015-05-25 (×2): 25 ug via INTRAVENOUS
  Administered 2015-05-25: 50 ug via INTRAVENOUS

## 2015-05-25 MED ORDER — PROPOFOL 500 MG/50ML IV EMUL
INTRAVENOUS | Status: DC | PRN
  Administered 2015-05-25: 50 ug/kg/min via INTRAVENOUS

## 2015-05-25 MED ORDER — DEXAMETHASONE SODIUM PHOSPHATE 10 MG/ML IJ SOLN
10.0000 mg | Freq: Once | INTRAMUSCULAR | Status: AC
  Administered 2015-05-25: 10 mg via INTRAVENOUS

## 2015-05-25 MED ORDER — MORPHINE SULFATE (PF) 2 MG/ML IV SOLN
1.0000 mg | INTRAVENOUS | Status: DC | PRN
  Administered 2015-05-25: 1 mg via INTRAVENOUS
  Filled 2015-05-25: qty 1

## 2015-05-25 MED ORDER — OXYCODONE HCL 5 MG PO TABS
5.0000 mg | ORAL_TABLET | ORAL | Status: DC | PRN
  Administered 2015-05-25 – 2015-05-27 (×11): 10 mg via ORAL
  Filled 2015-05-25 (×11): qty 2

## 2015-05-25 MED ORDER — ONDANSETRON HCL 4 MG/2ML IJ SOLN
INTRAMUSCULAR | Status: AC
Start: 2015-05-25 — End: 2015-05-25
  Filled 2015-05-25: qty 2

## 2015-05-25 MED ORDER — LACTATED RINGERS IV SOLN: INTRAVENOUS | Status: DC

## 2015-05-25 MED ORDER — BUPIVACAINE HCL (PF) 0.75 % IJ SOLN
INTRAMUSCULAR | Status: DC | PRN
  Administered 2015-05-25: 2 mL via INTRATHECAL

## 2015-05-25 MED ORDER — METOCLOPRAMIDE HCL 10 MG PO TABS: 5.0000 mg | ORAL_TABLET | Freq: Three times a day (TID) | ORAL | Status: DC | PRN

## 2015-05-25 MED ORDER — ONDANSETRON HCL 4 MG/2ML IJ SOLN: 4.0000 mg | Freq: Four times a day (QID) | INTRAMUSCULAR | Status: DC | PRN

## 2015-05-25 MED ORDER — ONDANSETRON HCL 4 MG/2ML IJ SOLN
INTRAMUSCULAR | Status: DC | PRN
  Administered 2015-05-25: 4 mg via INTRAVENOUS

## 2015-05-25 MED ORDER — CHLORHEXIDINE GLUCONATE 4 % EX LIQD: 60.0000 mL | Freq: Once | CUTANEOUS | Status: DC

## 2015-05-25 MED ORDER — SODIUM CHLORIDE 0.9 % IJ SOLN
INTRAMUSCULAR | Status: DC | PRN
  Administered 2015-05-25: 30 mL

## 2015-05-25 MED ORDER — BUPIVACAINE LIPOSOME 1.3 % IJ SUSP: INTRAMUSCULAR | Status: DC | PRN

## 2015-05-25 MED ORDER — ONDANSETRON HCL 4 MG/2ML IJ SOLN: 4.0000 mg | Freq: Once | INTRAMUSCULAR | Status: DC | PRN

## 2015-05-25 MED ORDER — MENTHOL 3 MG MT LOZG: 1.0000 | LOZENGE | OROMUCOSAL | Status: DC | PRN

## 2015-05-25 MED ORDER — SODIUM CHLORIDE 0.9 % IV SOLN
INTRAVENOUS | Status: DC
  Administered 2015-05-25 – 2015-05-26 (×2): via INTRAVENOUS

## 2015-05-25 MED ORDER — FENTANYL CITRATE (PF) 100 MCG/2ML IJ SOLN: 25.0000 ug | INTRAMUSCULAR | Status: DC | PRN

## 2015-05-25 MED ORDER — SODIUM CHLORIDE 0.9 % IJ SOLN
INTRAMUSCULAR | Status: AC
  Filled 2015-05-25: qty 50

## 2015-05-25 MED ORDER — PHENOL 1.4 % MT LIQD: 1.0000 | OROMUCOSAL | Status: DC | PRN

## 2015-05-25 MED ORDER — DIPHENHYDRAMINE HCL 12.5 MG/5ML PO ELIX: 12.5000 mg | ORAL_SOLUTION | ORAL | Status: DC | PRN

## 2015-05-25 MED ORDER — METOCLOPRAMIDE HCL 5 MG/ML IJ SOLN: 5.0000 mg | Freq: Three times a day (TID) | INTRAMUSCULAR | Status: DC | PRN

## 2015-05-25 SURGICAL SUPPLY — 50 items
BAG DECANTER FOR FLEXI CONT (MISCELLANEOUS) ×3 IMPLANT
BAG ZIPLOCK 12X15 (MISCELLANEOUS) ×3 IMPLANT
BANDAGE ACE 6X5 VEL STRL LF (GAUZE/BANDAGES/DRESSINGS) ×3 IMPLANT
BLADE SAG 18X100X1.27 (BLADE) ×3 IMPLANT
BLADE SAW SGTL 11.0X1.19X90.0M (BLADE) ×3 IMPLANT
BOWL SMART MIX CTS (DISPOSABLE) ×3 IMPLANT
CAP KNEE TOTAL 3 SIGMA ×3 IMPLANT
CEMENT HV SMART SET (Cement) ×6 IMPLANT
CLOSURE WOUND 1/2 X4 (GAUZE/BANDAGES/DRESSINGS) ×1
CLOTH BEACON ORANGE TIMEOUT ST (SAFETY) ×3 IMPLANT
CUFF TOURN SGL QUICK 34 (TOURNIQUET CUFF) ×2
CUFF TRNQT CYL 34X4X40X1 (TOURNIQUET CUFF) ×1 IMPLANT
DECANTER SPIKE VIAL GLASS SM (MISCELLANEOUS) ×3 IMPLANT
DRAPE U-SHAPE 47X51 STRL (DRAPES) ×3 IMPLANT
DRSG ADAPTIC 3X8 NADH LF (GAUZE/BANDAGES/DRESSINGS) ×3 IMPLANT
DRSG PAD ABDOMINAL 8X10 ST (GAUZE/BANDAGES/DRESSINGS) ×6 IMPLANT
DURAPREP 26ML APPLICATOR (WOUND CARE) ×3 IMPLANT
ELECT REM PT RETURN 9FT ADLT (ELECTROSURGICAL) ×3
ELECTRODE REM PT RTRN 9FT ADLT (ELECTROSURGICAL) ×1 IMPLANT
EVACUATOR 1/8 PVC DRAIN (DRAIN) ×3 IMPLANT
GAUZE SPONGE 4X4 12PLY STRL (GAUZE/BANDAGES/DRESSINGS) ×3 IMPLANT
GLOVE BIO SURGEON STRL SZ7.5 (GLOVE) IMPLANT
GLOVE BIO SURGEON STRL SZ8 (GLOVE) ×3 IMPLANT
GLOVE BIOGEL PI IND STRL 6.5 (GLOVE) IMPLANT
GLOVE BIOGEL PI IND STRL 8 (GLOVE) ×1 IMPLANT
GLOVE BIOGEL PI INDICATOR 6.5 (GLOVE)
GLOVE BIOGEL PI INDICATOR 8 (GLOVE) ×2
GLOVE SURG SS PI 6.5 STRL IVOR (GLOVE) IMPLANT
GOWN STRL REUS W/TWL LRG LVL3 (GOWN DISPOSABLE) ×3 IMPLANT
GOWN STRL REUS W/TWL XL LVL3 (GOWN DISPOSABLE) IMPLANT
HANDPIECE INTERPULSE COAX TIP (DISPOSABLE) ×2
IMMOBILIZER KNEE 20 (SOFTGOODS) ×3
IMMOBILIZER KNEE 20 THIGH 36 (SOFTGOODS) ×1 IMPLANT
MANIFOLD NEPTUNE II (INSTRUMENTS) ×3 IMPLANT
NS IRRIG 1000ML POUR BTL (IV SOLUTION) ×3 IMPLANT
PACK TOTAL KNEE CUSTOM (KITS) ×3 IMPLANT
PADDING CAST COTTON 6X4 STRL (CAST SUPPLIES) ×6 IMPLANT
POSITIONER SURGICAL ARM (MISCELLANEOUS) ×3 IMPLANT
SET HNDPC FAN SPRY TIP SCT (DISPOSABLE) ×1 IMPLANT
STRIP CLOSURE SKIN 1/2X4 (GAUZE/BANDAGES/DRESSINGS) ×2 IMPLANT
SUT MNCRL AB 4-0 PS2 18 (SUTURE) ×3 IMPLANT
SUT VIC AB 2-0 CT1 27 (SUTURE) ×6
SUT VIC AB 2-0 CT1 TAPERPNT 27 (SUTURE) ×3 IMPLANT
SUT VLOC 180 0 24IN GS25 (SUTURE) ×3 IMPLANT
SYR 50ML LL SCALE MARK (SYRINGE) IMPLANT
TRAY FOLEY W/METER SILVER 14FR (SET/KITS/TRAYS/PACK) ×3 IMPLANT
TRAY FOLEY W/METER SILVER 16FR (SET/KITS/TRAYS/PACK) IMPLANT
WATER STERILE IRR 1500ML POUR (IV SOLUTION) ×3 IMPLANT
WRAP KNEE MAXI GEL POST OP (GAUZE/BANDAGES/DRESSINGS) ×3 IMPLANT
YANKAUER SUCT BULB TIP 10FT TU (MISCELLANEOUS) ×3 IMPLANT

## 2015-05-25 NOTE — Anesthesia Procedure Notes (Addendum)
Spinal Patient location during procedure: OR Start time: 05/25/2015 8:56 AM End time: 05/25/2015 9:02 AM Staffing Resident/CRNA: Tavaris Eudy Performed by: resident/CRNA  Spinal Block Patient position: sitting Prep: ChloraPrep Patient monitoring: heart rate, cardiac monitor, continuous pulse ox and blood pressure Location: L4-5 Injection technique: single-shot Needle Needle type: Quincke  Needle gauge: 22 G

## 2015-05-25 NOTE — Anesthesia Postprocedure Evaluation (Addendum)
Anesthesia Post Note  Patient: Patricia Morton  Procedure(s) Performed: Procedure(s) (LRB): TOTAL KNEE ARTHROPLASTY (Right)  Patient location during evaluation: PACU Anesthesia Type: Spinal Level of consciousness: awake and alert Pain management: pain level controlled Vital Signs Assessment: post-procedure vital signs reviewed and stable Respiratory status: spontaneous breathing, nonlabored ventilation, respiratory function stable and patient connected to nasal cannula oxygen Cardiovascular status: stable and blood pressure returned to baseline Postop Assessment: spinal receding Anesthetic complications: no    Last Vitals:  Filed Vitals:   05/25/15 1130 05/25/15 1145  BP: 124/58 127/52  Pulse: 54 54  Temp:  36.3 C  Resp: 12 12    Last Pain:  Filed Vitals:   05/25/15 1150  PainSc: 0-No pain    LLE Motor Response: No movement due to regional block (05/25/15 1145) LLE Sensation: Numbness (05/25/15 1145) RLE Motor Response: No movement due to regional block (05/25/15 1145) RLE Sensation: Numbness (05/25/15 1145) L Sensory Level: L3-Anterior knee, lower leg (05/25/15 1145) R Sensory Level: L3-Anterior knee, lower leg (05/25/15 1145)  Zenaida Deed

## 2015-05-25 NOTE — H&P (View-Only) (Signed)
Patricia Morton DOB: 1926/04/09 Widowed / Language: Cleophus Molt / Race: White Female Date of Admission:  05/25/2015  CC:  Right Knee Pain History of Present Illness  The patient is a 82 year old female who comes in for a preoperative History and Physical. The patient is scheduled for a right total knee arthroplasty to be performed by Dr. Dione Plover. Aluisio, MD at [facility] on 05-25-2015. The patient is a 82 year old female who presented with knee complaints. The patient was seen for a second opinion. The patient reports right knee symptoms including: pain, swelling, giving way, weakness and stiffness which began year(s) (worse within the past year) ago without any known injury. The patient describes the severity of the symptoms as severe. The patient describes their pain as sharp.The patient feels that the symptoms are worsening. Prior to being seen, the patient was previously evaluated by a colleague. Previous work-up for this problem has included knee x-rays. Past treatment for this problem has included intra-articular injection of corticosteroids (finished Euflexxa series in both knees on 11/05/14). and include knee pain, medial knee tenderness, swelling, instability, difficulty bearing weight and difficulty ambulating. The patient does not report any radiation of symptoms. Onset of symptoms was gradual with symptoms now occurring frequently. Symptoms are exacerbated by motion at the knee, weight bearing, walking, kneeling and squatting. Symptoms are relieved by rest. The patient is not currently being treated for this problem. Patient states she takes tramadol for pain. I previously did the knee replacement on her husband, Thayer Jew, many years ago. She was very pleased with how his outcome was. Unfortunately, her right knee is getting progressively worse and worse over time. It is at a stage where it is hurting her all the time. It is definitely limiting what she can and cannot do. She cannot trust the knee  and feels as though it is going to give out on her. She has not had any falls, but has come close. She has been treated by Dr. Durward Fortes most recently with a series of Euflexxa injections. Unfortunately, did not help. She is now ready to proceed with surgery for her knee. They have been treated conservatively in the past for the above stated problem and despite conservative measures, they continue to have progressive pain and severe functional limitations and dysfunction. They have failed non-operative management including home exercise, medications, and injections. It is felt that they would benefit from undergoing total joint replacement. Risks and benefits of the procedure have been discussed with the patient and they elect to proceed with surgery. There are no active contraindications to surgery such as ongoing infection or rapidly progressive neurological disease.  Problem List/Past Medical  Primary osteoarthritis of right knee (M17.11)  Depression  High blood pressure  Hypercholesterolemia  Hypothyroidism  Osteoarthritis  Vertigo  Menopause  Allergies No Known Drug Allergies   Family History  Cancer  Maternal Grandmother, Mother. Congestive Heart Failure  Paternal Grandfather. Depression  Brother, Sister. Drug / Alcohol Addiction  Brother, Father, Paternal Grandfather. Heart Disease  Brother, Paternal 56, Sister. Hypertension  Sister. Liver Disease, Chronic  Father. Osteoarthritis  Brother, Sister.  Social History  Children  2 Current drinker  02/25/2015: Currently drinks wine only occasionally per week Current work status  retired Exercise  Exercises rarely Living situation  live alone Marital status  widowed No history of drug/alcohol rehab  Not under pain contract  Number of flights of stairs before winded  2-3 Tobacco / smoke exposure  02/25/2015: no Tobacco use  Former smoker. 02/25/2015: smoke(d) less than 1/2 pack(s) per  day [place] with Family Advance Directives  Living Will, Healthcare POA  Medication History  TraMADol HCl (50MG  Tablet, 1-2 Tablet Oral every 6-8 hours as needed for pain, Taken starting 02/26/2015) Active. Triamterene-HCTZ (37.5-25MG  Tablet, Oral) Active. Simvastatin (40MG  Tablet, Oral) Active. Levothyroxine Sodium (75MCG Tablet, Oral) Active. Metoprolol Tartrate (50MG  Tablet, Oral) Active. LORazepam (1MG  Tablet, Oral) Active. Gabapentin (300MG  Capsule, Oral) Active.  Past Surgical History Breast Biopsy  Date: 58. right Hysterectomy  Date: 45. complete (non-cancerous)    Review of Systems General Present- Night Sweats. Not Present- Chills, Fatigue, Fever, Memory Loss, Weight Gain and Weight Loss. Skin Not Present- Eczema, Hives, Itching, Lesions and Rash. HEENT Present- Tinnitus. Not Present- Dentures, Double Vision, Headache, Hearing Loss and Visual Loss. Respiratory Not Present- Allergies, Chronic Cough, Coughing up blood, Shortness of breath at rest and Shortness of breath with exertion. Cardiovascular Not Present- Chest Pain, Difficulty Breathing Lying Down, Murmur, Palpitations, Racing/skipping heartbeats and Swelling. Gastrointestinal Present- Loss of appetitie. Not Present- Abdominal Pain, Bloody Stool, Constipation, Diarrhea, Difficulty Swallowing, Heartburn, Jaundice, Nausea and Vomiting. Female Genitourinary Present- Urinating at Night. Not Present- Blood in Urine, Discharge, Flank Pain, Incontinence, Painful Urination, Urgency, Urinary frequency, Urinary Retention and Weak urinary stream. Musculoskeletal Present- Joint Pain and Joint Swelling. Not Present- Back Pain, Morning Stiffness, Muscle Pain, Muscle Weakness and Spasms. Neurological Not Present- Blackout spells, Difficulty with balance, Dizziness, Paralysis, Tremor and Weakness. Psychiatric Not Present- Insomnia.  Vitals Weight: 149 lb Height: 63in Body Surface Area: 1.71 m  Body Mass Index: 26.39 kg/m  BP: 118/82 (Sitting, Right Arm, Standard)   Physical Exam General Mental Status -Alert, cooperative and good historian. General Appearance-pleasant, Not in acute distress. Orientation-Oriented X3. Build & Nutrition-Well nourished and Well developed.  Head and Neck Head-normocephalic, atraumatic . Neck Global Assessment - supple, no bruit auscultated on the right, no bruit auscultated on the left.  Eye Pupil - Bilateral-Regular and Round. Motion - Bilateral-EOMI.  Chest and Lung Exam Auscultation Breath sounds - clear at anterior chest wall and clear at posterior chest wall. Adventitious sounds - No Adventitious sounds.  Cardiovascular Auscultation Rhythm - Regular rate and rhythm. Heart Sounds - S1 WNL and S2 WNL. Murmurs & Other Heart Sounds - Auscultation of the heart reveals - No Murmurs.  Abdomen Palpation/Percussion Tenderness - Abdomen is non-tender to palpation. Rigidity (guarding) - Abdomen is soft. Auscultation Auscultation of the abdomen reveals - Bowel sounds normal.  Female Genitourinary Note: Not done, not pertinent to present illness   Musculoskeletal Note: Evaluation of her hip show normal motion of discomfort. Her left knee shows no effusion. Ranged about 0 to 125. No tenderness or instability. Right knee, no effusion. Varus deformity. Ranged about 5 to 120. Marked crepitus on range of motion. Tenderness medial greater than lateral with no instability noted. Pulse, sensation, motor intact.  RADIOGRAPHS AP and lateral of the knees show that she has bone-on-bone arthritis, medial and patellofemoral compartments of the right knee with varus deformity.   Assessment & Plan  Primary osteoarthritis of right knee (M17.11)  Note:Surgical Plans: Right Total Knee Replacement  Disposition: Home with family  PCP: Dr. Scotty Court - Patient has been seen preoperatively and felt to be stable for surgery.  IV  TXA  Anesthesia Issues: None  Signed electronically by Ok Edwards, III PA-C

## 2015-05-25 NOTE — Anesthesia Preprocedure Evaluation (Signed)
Anesthesia Evaluation  Patient identified by MRN, date of birth, ID band Patient awake    Reviewed: Allergy & Precautions, H&P , NPO status , Patient's Chart, lab work & pertinent test results  History of Anesthesia Complications Negative for: history of anesthetic complications  Airway Mallampati: II  TM Distance: >3 FB Neck ROM: full    Dental no notable dental hx.    Pulmonary neg pulmonary ROS,    Pulmonary exam normal breath sounds clear to auscultation       Cardiovascular hypertension, Pt. on medications Normal cardiovascular exam Rhythm:regular Rate:Normal     Neuro/Psych  Headaches,    GI/Hepatic negative GI ROS, Neg liver ROS,   Endo/Other  Hypothyroidism   Renal/GU negative Renal ROS     Musculoskeletal  (+) Arthritis ,   Abdominal   Peds  Hematology negative hematology ROS (+)   Anesthesia Other Findings   Reproductive/Obstetrics negative OB ROS                             Anesthesia Physical Anesthesia Plan  ASA: II  Anesthesia Plan: Spinal   Post-op Pain Management:    Induction: Intravenous  Airway Management Planned: Simple Face Mask  Additional Equipment:   Intra-op Plan:   Post-operative Plan:   Informed Consent: I have reviewed the patients History and Physical, chart, labs and discussed the procedure including the risks, benefits and alternatives for the proposed anesthesia with the patient or authorized representative who has indicated his/her understanding and acceptance.   Dental Advisory Given  Plan Discussed with: Anesthesiologist, CRNA and Surgeon  Anesthesia Plan Comments:         Anesthesia Quick Evaluation

## 2015-05-25 NOTE — Op Note (Signed)
Pre-operative diagnosis- Osteoarthritis  Right knee(s)  Post-operative diagnosis- Osteoarthritis Right knee(s)  Procedure-  Right  Total Knee Arthroplasty  Surgeon- Dione Plover. Lovett Coffin, MD  Assistant- Ardeen Jourdain, PA-C   Anesthesia-  Spinal  EBL-* No blood loss amount entered *   Drains Hemovac  Tourniquet time- 29 minutes @ [id number] mm Hg  Complications- None  Condition-PACU - hemodynamically stable.   Brief Clinical Note  Patricia Morton is a 82 y.o. year old female with end stage OA of her right knee with progressively worsening pain and dysfunction. She has constant pain, with activity and at rest and significant functional deficits with difficulties even with ADLs. She has had extensive non-op management including analgesics, injections of cortisone, and home exercise program, but remains in significant pain with significant dysfunction.Radiographs show bone on bone arthritis medial and patellofemoral. She presents now for right Total Knee Arthroplasty.    Procedure in detail---   The patient is brought into the operating room and positioned supine on the operating table. After successful administration of  Spinal,   a tourniquet is placed high on the  Right thigh(s) and the lower extremity is prepped and draped in the usual sterile fashion. Time out is performed by the operating team and then the  Right lower extremity is wrapped in Esmarch, knee flexed and the tourniquet inflated to 300 mmHg.       A midline incision is made with a ten blade through the subcutaneous tissue to the level of the extensor mechanism. A fresh blade is used to make a medial parapatellar arthrotomy. Soft tissue over the proximal medial tibia is subperiosteally elevated to the joint line with a knife and into the semimembranosus bursa with a Cobb elevator. Soft tissue over the proximal lateral tibia is elevated with attention being paid to avoiding the patellar tendon on the tibial tubercle. The patella is everted,  knee flexed 90 degrees and the ACL and PCL are removed. Findings are bone on bone medial and patellofemoral with large global osteophytes.        The drill is used to create a starting hole in the distal femur and the canal is thoroughly irrigated with sterile saline to remove the fatty contents. The 5 degree Right  valgus alignment guide is placed into the femoral canal and the distal femoral cutting block is pinned to remove 10 mm off the distal femur. Resection is made with an oscillating saw.      The tibia is subluxed forward and the menisci are removed. The extramedullary alignment guide is placed referencing proximally at the medial aspect of the tibial tubercle and distally along the second metatarsal axis and tibial crest. The block is pinned to remove 32mm off the more deficient medial  side. Resection is made with an oscillating saw. Size 3is the most appropriate size for the tibia and the proximal tibia is prepared with the modular drill and keel punch for that size.      The femoral sizing guide is placed and size 3 is most appropriate. Rotation is marked off the epicondylar axis and confirmed by creating a rectangular flexion gap at 90 degrees. The size 3 cutting block is pinned in this rotation and the anterior, posterior and chamfer cuts are made with the oscillating saw. The intercondylar block is then placed and that cut is made.      Trial size 3 tibial component, trial size 3 posterior stabilized femur and a 12.5  mm posterior stabilized rotating platform insert  trial is placed. Full extension is achieved with excellent varus/valgus and anterior/posterior balance throughout full range of motion. The patella is everted and thickness measured to be 22  mm. Free hand resection is taken to 12 mm, a 35 template is placed, lug holes are drilled, trial patella is placed, and it tracks normally. Osteophytes are removed off the posterior femur with the trial in place. All trials are removed and the cut  bone surfaces prepared with pulsatile lavage. Cement is mixed and once ready for implantation, the size 3 tibial implant, size  3 posterior stabilized femoral component, and the size 35 patella are cemented in place and the patella is held with the clamp. The trial insert is placed and the knee held in full extension. The Exparel (20 ml mixed with 30 ml saline) and .25% Bupivicaine, are injected into the extensor mechanism, posterior capsule, medial and lateral gutters and subcutaneous tissues.  All extruded cement is removed and once the cement is hard the permanent 12.5 mm posterior stabilized rotating platform insert is placed into the tibial tray.      The wound is copiously irrigated with saline solution and the extensor mechanism closed over a hemovac drain with #1 V-loc suture. The tourniquet is released for a total tourniquet time of 29  minutes. Flexion against gravity is 140 degrees and the patella tracks normally. Subcutaneous tissue is closed with 2.0 vicryl and subcuticular with running 4.0 Monocryl. The incision is cleaned and dried and steri-strips and a bulky sterile dressing are applied. The limb is placed into a knee immobilizer and the patient is awakened and transported to recovery in stable condition.      Please note that a surgical assistant was a medical necessity for this procedure in order to perform it in a safe and expeditious manner. Surgical assistant was necessary to retract the ligaments and vital neurovascular structures to prevent injury to them and also necessary for proper positioning of the limb to allow for anatomic placement of the prosthesis.   Dione Plover Chloe Flis, MD    05/25/2015, 9:54 AM

## 2015-05-25 NOTE — Transfer of Care (Signed)
Immediate Anesthesia Transfer of Care Note  Patient: Patricia Morton  Procedure(s) Performed: Procedure(s): TOTAL KNEE ARTHROPLASTY (Right)  Patient Location: PACU  Anesthesia Type:Spinal  Level of Consciousness:  sedated, patient cooperative and responds to stimulation  Airway & Oxygen Therapy:Patient Spontanous Breathing and Patient connected to face mask oxgen  Post-op Assessment:  Report given to PACU RN and Post -op Vital signs reviewed and stable  Post vital signs:  Reviewed and stable  Last Vitals:  Filed Vitals:   05/25/15 0630  BP: 135/69  Pulse: 69  Temp: 36.7 C  Resp: 16    Complications: No apparent anesthesia complications

## 2015-05-25 NOTE — Progress Notes (Signed)
Utilization review completed.  

## 2015-05-25 NOTE — Evaluation (Signed)
Physical Therapy Evaluation Patient Details Name: Patricia Morton MRN: [id number] DOB: 05/03/1926 Today's Date: 05/25/2015   History of Present Illness  Pt is an 82 year old female s/p R TKA.  Clinical Impression  Pt is s/p R TKA resulting in the deficits listed below (see PT Problem List).  Pt will benefit from skilled PT to increase their independence and safety with mobility to allow discharge to the venue listed below.  Pt mobilizing well POD #0 and plans to d/c home to daughter's house.        Follow Up Recommendations Home health PT    Equipment Recommendations  Rolling walker with 5" wheels    Recommendations for Other Services       Precautions / Restrictions Precautions Precautions: Fall;Knee Required Braces or Orthoses: Knee Immobilizer - Right Restrictions Other Position/Activity Restrictions: WBAT      Mobility  Bed Mobility Overal bed mobility: Needs Assistance Bed Mobility: Supine to Sit     Supine to sit: HOB elevated;Min guard     General bed mobility comments: verbal cues for self assist  Transfers Overall transfer level: Needs assistance Equipment used: Rolling walker (2 wheeled) Transfers: Sit to/from Stand Sit to Stand: Min assist         General transfer comment: verbal cues for UE and LE positioning, assist to rise and control descent  Ambulation/Gait Ambulation/Gait assistance: Min assist Ambulation Distance (Feet): 60 Feet Assistive device: Rolling walker (2 wheeled) Gait Pattern/deviations: Step-to pattern;Antalgic     General Gait Details: verbal cues for sequence, RW positioning, step length, posture  Stairs            Wheelchair Mobility    Modified Rankin (Stroke Patients Only)       Balance                                             Pertinent Vitals/Pain Pain Assessment: 0-10 Pain Score: 3  Pain Location: R knee Pain Descriptors / Indicators: Sore;Aching Pain Intervention(s): Limited  activity within patient's tolerance;Monitored during session;Repositioned;Premedicated before session;Ice applied    Home Living Family/patient expects to be discharged to:: Private residence Living Arrangements: Alone Available Help at Discharge: Family Type of Home: House Home Access: Ramped entrance     [place]: Able to live on main level with bedroom/bathroom Home Equipment: [place] - single point Additional Comments: going to daughter's home as above    Prior Function Level of Independence: Independent               Hand Dominance        Extremity/Trunk Assessment               Lower Extremity Assessment: RLE deficits/detail RLE Deficits / Details: able to perform SLR, ROM TBA       Communication   Communication: HOH  Cognition Arousal/Alertness: Awake/alert Behavior During Therapy: WFL for tasks assessed/performed Overall Cognitive Status: Within Functional Limits for tasks assessed                      General Comments      Exercises        Assessment/Plan    PT Assessment Patient needs continued PT services  PT Diagnosis Difficulty walking;Acute pain   PT Problem List Decreased strength;Decreased range of motion;Decreased mobility;Decreased knowledge of precautions;Pain;Decreased knowledge of use of DME  PT  Treatment Interventions Functional mobility training;Gait training;DME instruction;Patient/family education;Therapeutic activities;Therapeutic exercise   PT Goals (Current goals can be found in the Care Plan section) Acute Rehab PT Goals PT Goal Formulation: With patient Time For Goal Achievement: 05/29/15 Potential to Achieve Goals: Good    Frequency 7X/week   Barriers to discharge        Co-evaluation               End of Session Equipment Utilized During Treatment: Gait belt;Right knee immobilizer Activity Tolerance: Patient tolerated treatment well Patient left: in chair;with call bell/phone within reach;with  chair alarm set;with family/visitor present           Time: [id number] PT Time Calculation (min) (ACUTE ONLY): 17 min   Charges:   PT Evaluation $PT Eval Low Complexity: 1 Procedure     PT G Codes:        Kacper Cartlidge,KATHrine E 05/25/2015, 5:04 PM Carmelia Bake, PT, DPT 05/25/2015 Pager: [phone]

## 2015-05-25 NOTE — Interval H&P Note (Signed)
History and Physical Interval Note:  05/25/2015 6:44 AM  Patricia Morton  has presented today for surgery, with the diagnosis of RIGHT KNEE OA  The various methods of treatment have been discussed with the patient and family. After consideration of risks, benefits and other options for treatment, the patient has consented to  Procedure(s): TOTAL KNEE ARTHROPLASTY (Right) as a surgical intervention .  The patient's history has been reviewed, patient examined, no change in status, stable for surgery.  I have reviewed the patient's chart and labs.  Questions were answered to the patient's satisfaction.     Gearlean Alf

## 2015-05-26 LAB — BASIC METABOLIC PANEL
Anion gap: 7 (ref 5–15)
BUN: 15 mg/dL (ref 6–20)
CO2: 25 mmol/L (ref 22–32)
Calcium: 8.6 mg/dL — ABNORMAL LOW (ref 8.9–10.3)
Chloride: 110 mmol/L (ref 101–111)
Creatinine, Ser: 0.8 mg/dL (ref 0.44–1.00)
Glucose, Bld: 137 mg/dL — ABNORMAL HIGH (ref 65–99)
Potassium: 4.3 mmol/L (ref 3.5–5.1)
SODIUM: 142 mmol/L (ref 135–145)

## 2015-05-26 LAB — CBC
HCT: 31.3 % — ABNORMAL LOW (ref 36.0–46.0)
Hemoglobin: 10.4 g/dL — ABNORMAL LOW (ref 12.0–15.0)
MCH: 30.1 pg (ref 26.0–34.0)
MCHC: 33.2 g/dL (ref 30.0–36.0)
MCV: 90.5 fL (ref 78.0–100.0)
Platelets: 255 10*3/uL (ref 150–400)
RBC: 3.46 MIL/uL — ABNORMAL LOW (ref 3.87–5.11)
RDW: 14 % (ref 11.5–15.5)
WBC: 10.1 10*3/uL (ref 4.0–10.5)

## 2015-05-26 MED ORDER — RIVAROXABAN 10 MG PO TABS: 10.0000 mg | ORAL_TABLET | Freq: Every day | ORAL | Status: DC

## 2015-05-26 MED ORDER — OXYCODONE HCL 5 MG PO TABS: 5.0000 mg | ORAL_TABLET | ORAL | Status: DC | PRN

## 2015-05-26 MED ORDER — TRAMADOL HCL 50 MG PO TABS: 50.0000 mg | ORAL_TABLET | Freq: Four times a day (QID) | ORAL | Status: DC | PRN

## 2015-05-26 MED ORDER — METHOCARBAMOL 500 MG PO TABS: 500.0000 mg | ORAL_TABLET | Freq: Four times a day (QID) | ORAL | Status: DC | PRN

## 2015-05-26 NOTE — Progress Notes (Signed)
Physical Therapy Treatment Patient Details Name: Patricia Morton MRN: [id number] DOB: 02-13-1927 Today's Date: 05/26/2015    History of Present Illness Pt is an 82 year old female s/p R TKA.    PT Comments    Pt ambulated in hallway and performed LE exercises.  Pt is progressing very well and anticipates d/c to daughter's home tomorrow.  Follow Up Recommendations  Home health PT     Equipment Recommendations  Rolling walker with 5" wheels    Recommendations for Other Services       Precautions / Restrictions Precautions Precautions: Fall;Knee Precaution Comments: able to perform SLR Required Braces or Orthoses: Knee Immobilizer - Right Restrictions Weight Bearing Restrictions: No Other Position/Activity Restrictions: WBAT    Mobility  Bed Mobility         Supine to sit: HOB elevated;Min guard     General bed mobility comments: pt up in recliner on arrival  Transfers Overall transfer level: Needs assistance Equipment used: Rolling walker (2 wheeled) Transfers: Sit to/from Stand Sit to Stand: Min guard Stand pivot transfers: Min guard       General transfer comment: verbal cues for UE and LE positioning  Ambulation/Gait Ambulation/Gait assistance: Min guard Ambulation Distance (Feet): 120 Feet Assistive device: Rolling walker (2 wheeled) Gait Pattern/deviations: Step-to pattern;Antalgic;Step-through pattern     General Gait Details: verbal cues for sequence, RW positioning, step length, posture, progressed to step through pattern   Stairs            Wheelchair Mobility    Modified Rankin (Stroke Patients Only)       Balance                                    Cognition Arousal/Alertness: Awake/alert Behavior During Therapy: WFL for tasks assessed/performed Overall Cognitive Status: Within Functional Limits for tasks assessed                      Exercises Total Joint Exercises Ankle Circles/Pumps: AROM;Both;10  reps Quad Sets: 10 reps;AROM;Both Short Arc Quad: AROM;Right;10 reps Heel Slides: AAROM;Right;10 reps Hip ABduction/ADduction: Right;10 reps Straight Leg Raises: AROM;Right;10 reps Goniometric ROM: R knee AAROM 90* in recliner    General Comments        Pertinent Vitals/Pain Pain Assessment: 0-10 Pain Score: 3  Faces Pain Scale: Hurts little more Pain Location: R knee Pain Descriptors / Indicators: Aching;Sore Pain Intervention(s): Limited activity within patient's tolerance;Monitored during session;Premedicated before session;Repositioned;Ice applied    Home Living Family/patient expects to be discharged to:: Private residence Living Arrangements: Alone Available Help at Discharge: Family           Additional Comments: going to daughter's home as above    Prior Function Level of Independence: Independent          PT Goals (current goals can now be found in the care plan section) Acute Rehab PT Goals Patient Stated Goal: get back to being independent Progress towards PT goals: Progressing toward goals    Frequency  7X/week    PT Plan Current plan remains appropriate    Co-evaluation             End of Session Equipment Utilized During Treatment: Gait belt Activity Tolerance: Patient tolerated treatment well Patient left: in chair;with call bell/phone within reach;with chair alarm set;with family/visitor present     Time: 1020-1039 PT Time Calculation (min) (ACUTE ONLY): 19 min  Charges:  $Therapeutic Exercise: 8-22 mins                    G Codes:      Chavon Lucarelli,KATHrine E 06-04-15, 11:24 AM Carmelia Bake, PT, DPT 06-04-2015 Pager: [phone]

## 2015-05-26 NOTE — Progress Notes (Signed)
Physical Therapy Treatment Note    05/26/15 1300  PT Visit Information  Last PT Received On 05/26/15  Assistance Needed +1  History of Present Illness Pt is an 82 year old female s/p R TKA.  PT Time Calculation  PT Start Time (ACUTE ONLY) 1338  PT Stop Time (ACUTE ONLY) 1348  PT Time Calculation (min) (ACUTE ONLY) 10 min  Subjective Data  Subjective Pt ambulated again in hallway and assisted back to bed.  Precautions  Precautions Fall;Knee  Precaution Comments able to perform SLR  Restrictions  Other Position/Activity Restrictions WBAT  Pain Assessment  Pain Assessment 0-10  Pain Score 3  Pain Location R knee  Pain Descriptors / Indicators Aching;Sore  Pain Intervention(s) Limited activity within patient's tolerance;Premedicated before session;Monitored during session;Repositioned;Ice applied  Cognition  Arousal/Alertness Awake/alert  Behavior During Therapy WFL for tasks assessed/performed  Overall Cognitive Status Within Functional Limits for tasks assessed  Bed Mobility  Overal bed mobility Needs Assistance  Bed Mobility Sit to Supine  General bed mobility comments pt able to return to supine without assist, line management provided  Transfers  Overall transfer level Needs assistance  Equipment used Rolling walker (2 wheeled)  Transfers Sit to/from Stand  Sit to Stand Min guard  General transfer comment verbal cues for UE positioning  Ambulation/Gait  Ambulation/Gait assistance Min guard  Ambulation Distance (Feet) 160 Feet  Assistive device Rolling walker (2 wheeled)  Gait Pattern/deviations Step-through pattern;Antalgic  General Gait Details verbal cues for RW positioning, pt able to self recall upright posture, attempting heel strike  PT - End of Session  Activity Tolerance Patient tolerated treatment well  Patient left in bed;with call bell/phone within reach;with bed alarm set  PT - Assessment/Plan  PT Plan Current plan remains appropriate  PT Frequency  (ACUTE ONLY) 7X/week  Follow Up Recommendations Home health PT  PT equipment Rolling walker with 5" wheels  PT Goal Progression  Progress towards PT goals Progressing toward goals  PT General Charges  $$ ACUTE PT VISIT 1 Procedure  PT Treatments  $Gait Training 8-22 mins   Carmelia Bake, PT, DPT 05/26/2015 Pager: [phone]

## 2015-05-26 NOTE — Discharge Instructions (Addendum)
° °Dr. Frank Aluisio °Total Joint Specialist °[facility] °[address] °[place], [place] [address] °[phone] ° °TOTAL KNEE REPLACEMENT POSTOPERATIVE DIRECTIONS ° °Knee Rehabilitation, Guidelines Following Surgery  °Results after knee surgery are often greatly improved when you follow the exercise, range of motion and muscle strengthening exercises prescribed by your doctor. Safety measures are also important to protect the knee from further injury. Any time any of these exercises cause you to have increased pain or swelling in your knee joint, decrease the amount until you are comfortable again and slowly increase them. If you have problems or questions, call your caregiver or physical therapist for advice.  ° °HOME CARE INSTRUCTIONS  °Remove items at home which could result in a fall. This includes throw rugs or furniture in walking pathways.  °· ICE to the affected knee every three hours for 30 minutes at a time and then as needed for pain and swelling.  Continue to use ice on the knee for pain and swelling from surgery. You may notice swelling that will progress down to the foot and ankle.  This is normal after surgery.  Elevate the leg when you are not up walking on it.   °· Continue to use the breathing machine which will help keep your temperature down.  It is common for your temperature to cycle up and down following surgery, especially at night when you are not up moving around and exerting yourself.  The breathing machine keeps your lungs expanded and your temperature down. °· Do not place pillow under knee, focus on keeping the knee straight while resting ° °DIET °You may resume your previous home diet once your are discharged from the hospital. ° °DRESSING / WOUND CARE / SHOWERING °You may shower 3 days after surgery, but keep the wounds dry during showering.  You may use an occlusive plastic wrap (Press'n Seal for example), NO SOAKING/SUBMERGING IN THE BATHTUB.  If the  bandage gets wet, change with a clean dry gauze.  If the incision gets wet, pat the wound dry with a clean towel. °You may start showering once you are discharged home but do not submerge the incision under water. Just pat the incision dry and apply a dry gauze dressing on daily. °Change the surgical dressing daily and reapply a dry dressing each time. ° °ACTIVITY °Walk with your walker as instructed. °Use walker as long as suggested by your caregivers. °Avoid periods of inactivity such as sitting longer than an hour when not asleep. This helps prevent blood clots.  °You may resume a sexual relationship in one month or when given the OK by your doctor.  °You may return to work once you are cleared by your doctor.  °Do not drive a car for 6 weeks or until released by you surgeon.  °Do not drive while taking narcotics. ° °WEIGHT BEARING °Weight bearing as tolerated with assist device (walker, cane, etc) as directed, use it as long as suggested by your surgeon or therapist, typically at least 4-6 weeks. ° °POSTOPERATIVE CONSTIPATION PROTOCOL °Constipation - defined medically as fewer than three stools per week and severe constipation as less than one stool per week. ° °One of the most common issues patients have following surgery is constipation.  Even if you have a regular bowel pattern at home, your normal regimen is likely to be disrupted due to multiple reasons following surgery.  Combination of anesthesia, postoperative narcotics, change in appetite and fluid intake all can affect your bowels.    In order to avoid complications following surgery, here are some recommendations in order to help you during your recovery period. ° °Colace (docusate) - Pick up an over-the-counter form of Colace or another stool softener and take twice a day as long as you are requiring postoperative pain medications.  Take with a full glass of water daily.  If you experience loose stools or diarrhea, hold the colace until you stool forms  back up.  If your symptoms do not get better within 1 week or if they get worse, check with your doctor. ° °Dulcolax (bisacodyl) - Pick up over-the-counter and take as directed by the product packaging as needed to assist with the movement of your bowels.  Take with a full glass of water.  Use this product as needed if not relieved by Colace only.  ° °MiraLax (polyethylene glycol) - Pick up over-the-counter to have on hand.  MiraLax is a solution that will increase the amount of water in your bowels to assist with bowel movements.  Take as directed and can mix with a glass of water, juice, soda, coffee, or tea.  Take if you go more than two days without a movement. °Do not use MiraLax more than once per day. Call your doctor if you are still constipated or irregular after using this medication for 7 days in a row. ° °If you continue to have problems with postoperative constipation, please contact the office for further assistance and recommendations.  If you experience "the worst abdominal pain ever" or develop nausea or vomiting, please contact the office immediatly for further recommendations for treatment. ° °ITCHING ° If you experience itching with your medications, try taking only a single pain pill, or even half a pain pill at a time.  You can also use Benadryl over the counter for itching or also to help with sleep.  ° °TED HOSE STOCKINGS °Wear the elastic stockings on both legs for three weeks following surgery during the day but you may remove then at night for sleeping. ° °MEDICATIONS °See your medication summary on the “After Visit Summary” that the nursing staff will review with you prior to discharge.  You may have some home medications which will be placed on hold until you complete the course of blood thinner medication.  It is important for you to complete the blood thinner medication as prescribed by your surgeon.  Continue your approved medications as instructed at time of  discharge. ° °PRECAUTIONS °If you experience chest pain or shortness of breath - call 911 immediately for transfer to the hospital emergency department.  °If you develop a fever greater that 101 F, purulent drainage from wound, increased redness or drainage from wound, foul odor from the wound/dressing, or calf pain - CONTACT YOUR SURGEON.   °                                                °FOLLOW-UP APPOINTMENTS °Make sure you keep all of your appointments after your operation with your surgeon and caregivers. You should call the office at the above phone number and make an appointment for approximately two weeks after the date of your surgery or on the date instructed by your surgeon outlined in the "After Visit Summary". ° ° °RANGE OF MOTION AND STRENGTHENING EXERCISES  °Rehabilitation of the knee is important following a knee injury or   an operation. After just a few days of immobilization, the muscles of the thigh which control the knee become weakened and shrink (atrophy). Knee exercises are designed to build up the tone and strength of the thigh muscles and to improve knee motion. Often times heat used for twenty to thirty minutes before working out will loosen up your tissues and help with improving the range of motion but do not use heat for the first two weeks following surgery. These exercises can be done on a training (exercise) mat, on the floor, on a table or on a bed. Use what ever works the best and is most comfortable for you Knee exercises include:  °Leg Lifts - While your knee is still immobilized in a splint or cast, you can do straight leg raises. Lift the leg to 60 degrees, hold for 3 sec, and slowly lower the leg. Repeat 10-20 times 2-3 times daily. Perform this exercise against resistance later as your knee gets better.  °Quad and Hamstring Sets - Tighten up the muscle on the front of the thigh (Quad) and hold for 5-10 sec. Repeat this 10-20 times hourly. Hamstring sets are done by pushing the  foot backward against an object and holding for 5-10 sec. Repeat as with quad sets.  °· Leg Slides: Lying on your back, slowly slide your foot toward your buttocks, bending your knee up off the floor (only go as far as is comfortable). Then slowly slide your foot back down until your leg is flat on the floor again. °· Angel Wings: Lying on your back spread your legs to the side as far apart as you can without causing discomfort.  °A rehabilitation program following serious knee injuries can speed recovery and prevent re-injury in the future due to weakened muscles. Contact your doctor or a physical therapist for more information on knee rehabilitation.  ° °IF YOU ARE TRANSFERRED TO A SKILLED REHAB FACILITY °If the patient is transferred to a skilled rehab facility following release from the hospital, a list of the current medications will be sent to the facility for the patient to continue.  When discharged from the skilled rehab facility, please have the facility set up the patient's Home Health Physical Therapy prior to being released. Also, the skilled facility will be responsible for providing the patient with their medications at time of release from the facility to include their pain medication, the muscle relaxants, and their blood thinner medication. If the patient is still at the rehab facility at time of the two week follow up appointment, the skilled rehab facility will also need to assist the patient in arranging follow up appointment in our office and any transportation needs. ° °MAKE SURE YOU:  °Understand these instructions.  °Get help right away if you are not doing well or get worse.  ° ° °Pick up stool softner and laxative for home use following surgery while on pain medications. °Do not submerge incision under water. °Please use good hand washing techniques while changing dressing each day. °May shower starting three days after surgery. °Please use a clean towel to pat the incision dry following  showers. °Continue to use ice for pain and swelling after surgery. °Do not use any lotions or creams on the incision until instructed by your surgeon. ° °Take Xarelto for two and a half more weeks, then discontinue Xarelto. °Once the patient has completed the blood thinner regimen, then take a Baby 81 mg Aspirin daily for three more weeks. ° ° °Information   Information on my medicine - XARELTO® (Rivaroxaban) ° °This medication education was reviewed with me or my healthcare representative as part of my discharge preparation.  The pharmacist that spoke with me during my hospital stay was:  Zeigler, Dustin George, RPH ° °Why was Xarelto® prescribed for you? °Xarelto® was prescribed for you to reduce the risk of blood clots forming after orthopedic surgery. The medical term for these abnormal blood clots is venous thromboembolism (VTE). ° °What do you need to know about xarelto® ? °Take your Xarelto® ONCE DAILY at the same time every day. °You may take it either with or without food. ° °If you have difficulty swallowing the tablet whole, you may crush it and mix in applesauce just prior to taking your dose. ° °Take Xarelto® exactly as prescribed by your doctor and DO NOT stop taking Xarelto® without talking to the doctor who prescribed the medication.  Stopping without other VTE prevention medication to take the place of Xarelto® may increase your risk of developing a clot. ° °After discharge, you should have regular check-up appointments with your healthcare provider that is prescribing your Xarelto®.   ° °What do you do if you miss a dose? °If you miss a dose, take it as soon as you remember on the same day then continue your regularly scheduled once daily regimen the next day. Do not take two doses of Xarelto® on the same day.  ° °Important Safety Information °A possible side effect of Xarelto® is bleeding. You should call your healthcare provider right away if you experience any of the following: °? Bleeding from an injury or  your nose that does not stop. °? Unusual colored urine (red or dark brown) or unusual colored stools (red or black). °? Unusual bruising for unknown reasons. °? A serious fall or if you hit your head (even if there is no bleeding). ° °Some medicines may interact with Xarelto® and might increase your risk of bleeding while on Xarelto®. To help avoid this, consult your healthcare provider or pharmacist prior to using any new prescription or non-prescription medications, including herbals, vitamins, non-steroidal anti-inflammatory drugs (NSAIDs) and supplements. ° °This website has more information on Xarelto®: [email or web address]. ° ° ° °

## 2015-05-26 NOTE — Discharge Summary (Signed)
Physician Discharge Summary   Patient ID: Patricia Morton MRN: [id number] DOB/AGE: 03-26-1926 82 y.o.  Admit date: 05/25/2015 Discharge date: 05/27/2015  Primary Diagnosis:  Osteoarthritis Right knee(s)  Admission Diagnoses:  Past Medical History  Diagnosis Date  . Hypertension   . Hypothyroid   . Hyperlipemia   . Arthritis   . Headache     migraines-none for last 30 years   Discharge Diagnoses:   Principal Problem:   OA (osteoarthritis) of knee  Estimated body mass index is 26.29 kg/(m^2) as calculated from the following:   Height as of this encounter: _0  (1.6 m).   Weight as of this encounter: 67.314 kg (148 lb 6.4 oz).  Procedure:  Procedure(s) (LRB): TOTAL KNEE ARTHROPLASTY (Right)   Consults: None  HPI: Patricia Morton is a 82 y.o. year old female with end stage OA of her right knee with progressively worsening pain and dysfunction. She has constant pain, with activity and at rest and significant functional deficits with difficulties even with ADLs. She has had extensive non-op management including analgesics, injections of cortisone, and home exercise program, but remains in significant pain with significant dysfunction.Radiographs show bone on bone arthritis medial and patellofemoral. She presents now for right Total Knee Arthroplasty.   Laboratory Data: Admission on 05/25/2015, Discharged on 05/27/2015  Component Date Value Ref Range Status  . WBC 05/26/2015 10.1  4.0 - 10.5 K/uL Final  . RBC 05/26/2015 3.46* 3.87 - 5.11 MIL/uL Final  . Hemoglobin 05/26/2015 10.4* 12.0 - 15.0 g/dL Final  . HCT 05/26/2015 31.3* 36.0 - 46.0 % Final  . MCV 05/26/2015 90.5  78.0 - 100.0 fL Final  . MCH 05/26/2015 30.1  26.0 - 34.0 pg Final  . MCHC 05/26/2015 33.2  30.0 - 36.0 g/dL Final  . RDW 05/26/2015 14.0  11.5 - 15.5 % Final  . Platelets 05/26/2015 255  150 - 400 K/uL Final  . Sodium 05/26/2015 142  135 - 145 mmol/L Final  . Potassium 05/26/2015 4.3  3.5 - 5.1 mmol/L Final  .  Chloride 05/26/2015 110  101 - 111 mmol/L Final  . CO2 05/26/2015 25  22 - 32 mmol/L Final  . Glucose, Bld 05/26/2015 137* 65 - 99 mg/dL Final  . BUN 05/26/2015 15  6 - 20 mg/dL Final  . Creatinine, Ser 05/26/2015 0.80  0.44 - 1.00 mg/dL Final  . Calcium 05/26/2015 8.6* 8.9 - 10.3 mg/dL Final  . GFR calc non Af Amer 05/26/2015 >60  >60 mL/min Final  . GFR calc Af Amer 05/26/2015 >60  >60 mL/min Final   Comment: (NOTE) The eGFR has been calculated using the CKD EPI equation. This calculation has not been validated in all clinical situations. eGFR's persistently <60 mL/min signify possible Chronic Kidney Disease.   . Anion gap 05/26/2015 7  5 - 15 Final  . WBC 05/27/2015 11.6* 4.0 - 10.5 K/uL Final  . RBC 05/27/2015 3.38* 3.87 - 5.11 MIL/uL Final  . Hemoglobin 05/27/2015 10.0* 12.0 - 15.0 g/dL Final  . HCT 05/27/2015 30.8* 36.0 - 46.0 % Final  . MCV 05/27/2015 91.1  78.0 - 100.0 fL Final  . MCH 05/27/2015 29.6  26.0 - 34.0 pg Final  . MCHC 05/27/2015 32.5  30.0 - 36.0 g/dL Final  . RDW 05/27/2015 14.4  11.5 - 15.5 % Final  . Platelets 05/27/2015 274  150 - 400 K/uL Final  . Sodium 05/27/2015 143  135 - 145 mmol/L Final  . Potassium 05/27/2015 4.4  3.5 -  5.1 mmol/L Final  . Chloride 05/27/2015 110  101 - 111 mmol/L Final  . CO2 05/27/2015 27  22 - 32 mmol/L Final  . Glucose, Bld 05/27/2015 115* 65 - 99 mg/dL Final  . BUN 05/27/2015 15  6 - 20 mg/dL Final  . Creatinine, Ser 05/27/2015 0.69  0.44 - 1.00 mg/dL Final  . Calcium 05/27/2015 8.8* 8.9 - 10.3 mg/dL Final  . GFR calc non Af Amer 05/27/2015 >60  >60 mL/min Final  . GFR calc Af Amer 05/27/2015 >60  >60 mL/min Final   Comment: (NOTE) The eGFR has been calculated using the CKD EPI equation. This calculation has not been validated in all clinical situations. eGFR's persistently <60 mL/min signify possible Chronic Kidney Disease.   Georgiann Hahn gap 05/27/2015 6  5 - 15 Final  Hospital Outpatient Visit on 05/22/2015  Component Date  Value Ref Range Status  . MRSA, PCR 05/22/2015 NEGATIVE  NEGATIVE Final  . Staphylococcus aureus 05/22/2015 NEGATIVE  NEGATIVE Final   Comment:        The Xpert SA Assay (FDA approved for NASAL specimens in patients over 37 years of age), is one component of a comprehensive surveillance program.  Test performance has been validated by [facility] for patients greater than or equal to 3 year old. It is not intended to diagnose infection nor to guide or monitor treatment.   Marland Kitchen aPTT 05/22/2015 30  24 - 37 seconds Final  . WBC 05/22/2015 5.9  4.0 - 10.5 K/uL Final  . RBC 05/22/2015 4.36  3.87 - 5.11 MIL/uL Final  . Hemoglobin 05/22/2015 13.2  12.0 - 15.0 g/dL Final  . HCT 05/22/2015 41.3  36.0 - 46.0 % Final  . MCV 05/22/2015 94.7  78.0 - 100.0 fL Final  . MCH 05/22/2015 30.3  26.0 - 34.0 pg Final  . MCHC 05/22/2015 32.0  30.0 - 36.0 g/dL Final  . RDW 05/22/2015 14.1  11.5 - 15.5 % Final  . Platelets 05/22/2015 337  150 - 400 K/uL Final  . Sodium 05/22/2015 139  135 - 145 mmol/L Final  . Potassium 05/22/2015 4.6  3.5 - 5.1 mmol/L Final  . Chloride 05/22/2015 104  101 - 111 mmol/L Final  . CO2 05/22/2015 27  22 - 32 mmol/L Final  . Glucose, Bld 05/22/2015 79  65 - 99 mg/dL Final  . BUN 05/22/2015 14  6 - 20 mg/dL Final  . Creatinine, Ser 05/22/2015 0.90  0.44 - 1.00 mg/dL Final  . Calcium 05/22/2015 9.1  8.9 - 10.3 mg/dL Final  . Total Protein 05/22/2015 7.1  6.5 - 8.1 g/dL Final  . Albumin 05/22/2015 4.1  3.5 - 5.0 g/dL Final  . AST 05/22/2015 23  15 - 41 U/L Final  . ALT 05/22/2015 15  14 - 54 U/L Final  . Alkaline Phosphatase 05/22/2015 66  38 - 126 U/L Final  . Total Bilirubin 05/22/2015 0.4  0.3 - 1.2 mg/dL Final  . GFR calc non Af Amer 05/22/2015 56* >60 mL/min Final  . GFR calc Af Amer 05/22/2015 >60  >60 mL/min Final   Comment: (NOTE) The eGFR has been calculated using the CKD EPI equation. This calculation has not been validated in all clinical situations. eGFR's  persistently <60 mL/min signify possible Chronic Kidney Disease.   . Anion gap 05/22/2015 8  5 - 15 Final  . Prothrombin Time 05/22/2015 13.3  11.6 - 15.2 seconds Final  . INR 05/22/2015 1.03  0.00 - 1.49 Final  .  ABO/RH(D) 05/22/2015 A POS   Final  . Antibody Screen 05/22/2015 NEG   Final  . Sample Expiration 05/22/2015 05/28/2015   Final  . Extend sample reason 05/22/2015 NO TRANSFUSIONS OR PREGNANCY IN THE PAST 3 MONTHS   Final  . Color, Urine 05/22/2015 YELLOW  YELLOW Final  . APPearance 05/22/2015 CLEAR  CLEAR Final  . Specific Gravity, Urine 05/22/2015 1.007  1.005 - 1.030 Final  . pH 05/22/2015 7.0  5.0 - 8.0 Final  . Glucose, UA 05/22/2015 NEGATIVE  NEGATIVE mg/dL Final  . Hgb urine dipstick 05/22/2015 NEGATIVE  NEGATIVE Final  . Bilirubin Urine 05/22/2015 NEGATIVE  NEGATIVE Final  . Ketones, ur 05/22/2015 NEGATIVE  NEGATIVE mg/dL Final  . Protein, ur 05/22/2015 NEGATIVE  NEGATIVE mg/dL Final  . Nitrite 05/22/2015 NEGATIVE  NEGATIVE Final  . Leukocytes, UA 05/22/2015 TRACE* NEGATIVE Final  . Squamous Epithelial / LPF 05/22/2015 0-5* NONE SEEN Final  . WBC, UA 05/22/2015 0-5  0 - 5 WBC/hpf Final  . RBC / HPF 05/22/2015 0-5  0 - 5 RBC/hpf Final  . Bacteria, UA 05/22/2015 FEW* NONE SEEN Final  . ABO/RH(D) 05/22/2015 A POS   Final     X-Rays:No results found.  EKG: Orders placed or performed during the hospital encounter of 05/22/15  . EKG 12 lead  . EKG 12 lead     Hospital Course: Patricia Morton is a 82 y.o. who was admitted to [facility]. They were brought to the operating room on 05/25/2015 and underwent Procedure(s): TOTAL KNEE ARTHROPLASTY.  Patient tolerated the procedure well and was later transferred to the recovery room and then to the orthopaedic floor for postoperative care.  They were given PO and IV analgesics for pain control following their surgery.  They were given 24 hours of postoperative antibiotics of  Anti-infectives    Start     Dose/Rate Route  Frequency Ordered Stop   05/25/15 1500  ceFAZolin (ANCEF) IVPB 2 g/50 mL premix     2 g 100 mL/hr over 30 Minutes Intravenous Every 6 hours 05/25/15 1218 05/25/15 2139   05/25/15 0705  ceFAZolin (ANCEF) IVPB 2 g/50 mL premix     2 g 100 mL/hr over 30 Minutes Intravenous On call to O.R. 05/25/15 0626 05/25/15 0909     and started on DVT prophylaxis in the form of Xarelto.   PT and OT were ordered for total joint protocol.  Discharge planning consulted to help with postop disposition and equipment needs.  Patient had a good night on the evening of surgery.  They started to get up OOB with therapy on day one. Hemovac drain was pulled without difficulty.  Continued to work with therapy into day two.  Dressing was changed on day two and the incision was healing well. Patient was seen in rounds on POD 2 and was ready to go home.  Discharge home with home health Diet - Cardiac diet Follow up - in 2 weeks Activity - WBAT Disposition - Home Condition Upon Discharge - Good D/C Meds - See DC Summary DVT Prophylaxis - Xarelto      Discharge Instructions    Call MD / Call 911    Complete by:  As directed   If you experience chest pain or shortness of breath, CALL 911 and be transported to the hospital emergency room.  If you develope a fever above 101 F, pus (white drainage) or increased drainage or redness at the wound, or calf pain, call your surgeon's  office.     Change dressing    Complete by:  As directed   Change dressing daily with sterile 4 x 4 inch gauze dressing and apply TED hose. Do not submerge the incision under water.     Constipation Prevention    Complete by:  As directed   Drink plenty of fluids.  Prune juice may be helpful.  You may use a stool softener, such as Colace (over the counter) 100 mg twice a day.  Use MiraLax (over the counter) for constipation as needed.     Diet - low sodium heart healthy    Complete by:  As directed      Discharge instructions    Complete by:  As  directed   Pick up stool softner and laxative for home use following surgery while on pain medications. Do not submerge incision under water. Please use good hand washing techniques while changing dressing each day. May shower starting three days after surgery. Please use a clean towel to pat the incision dry following showers. Continue to use ice for pain and swelling after surgery. Do not use any lotions or creams on the incision until instructed by your surgeon.  Take Xarelto for two and a half more weeks, then discontinue Xarelto. Once the patient has completed the blood thinner regimen, then take a Baby 81 mg Aspirin daily for three more weeks.  Postoperative Constipation Protocol  Constipation - defined medically as fewer than three stools per week and severe constipation as less than one stool per week.  One of the most common issues patients have following surgery is constipation.  Even if you have a regular bowel pattern at home, your normal regimen is likely to be disrupted due to multiple reasons following surgery.  Combination of anesthesia, postoperative narcotics, change in appetite and fluid intake all can affect your bowels.  In order to avoid complications following surgery, here are some recommendations in order to help you during your recovery period.  Colace (docusate) - Pick up an over-the-counter form of Colace or another stool softener and take twice a day as long as you are requiring postoperative pain medications.  Take with a full glass of water daily.  If you experience loose stools or diarrhea, hold the colace until you stool forms back up.  If your symptoms do not get better within 1 week or if they get worse, check with your doctor.  Dulcolax (bisacodyl) - Pick up over-the-counter and take as directed by the product packaging as needed to assist with the movement of your bowels.  Take with a full glass of water.  Use this product as needed if not relieved by Colace  only.   MiraLax (polyethylene glycol) - Pick up over-the-counter to have on hand.  MiraLax is a solution that will increase the amount of water in your bowels to assist with bowel movements.  Take as directed and can mix with a glass of water, juice, soda, coffee, or tea.  Take if you go more than two days without a movement. Do not use MiraLax more than once per day. Call your doctor if you are still constipated or irregular after using this medication for 7 days in a row.  If you continue to have problems with postoperative constipation, please contact the office for further assistance and recommendations.  If you experience "the worst abdominal pain ever" or develop nausea or vomiting, please contact the office immediatly for further recommendations for treatment.  Do not put a pillow under the knee. Place it under the heel.    Complete by:  As directed      Do not sit on low chairs, stoools or toilet seats, as it may be difficult to get up from low surfaces    Complete by:  As directed      Driving restrictions    Complete by:  As directed   No driving until released by the physician.     Increase activity slowly as tolerated    Complete by:  As directed      Lifting restrictions    Complete by:  As directed   No lifting until released by the physician.     Patient may shower    Complete by:  As directed   You may shower without a dressing once there is no drainage.  Do not wash over the wound.  If drainage remains, do not shower until drainage stops.     TED hose    Complete by:  As directed   Use stockings (TED hose) for 3 weeks on both leg(s).  You may remove them at night for sleeping.     Weight bearing as tolerated    Complete by:  As directed   Laterality:  right  Extremity:  Lower            Medication List    STOP taking these medications        CALCIUM + D PO      TAKE these medications        gabapentin 300 MG capsule  Commonly known as:  NEURONTIN  Take  300 mg by mouth at bedtime.     levothyroxine 75 MCG tablet  Commonly known as:  SYNTHROID, LEVOTHROID  Take 75 mcg by mouth every morning.     LORazepam 1 MG tablet  Commonly known as:  ATIVAN  Take 1 mg by mouth at bedtime.     methocarbamol 500 MG tablet  Commonly known as:  ROBAXIN  Take 1 tablet (500 mg total) by mouth every 6 (six) hours as needed for muscle spasms.     metoprolol 50 MG tablet  Commonly known as:  LOPRESSOR  Take 50 mg by mouth 2 (two) times daily.     oxyCODONE 5 MG immediate release tablet  Commonly known as:  Oxy IR/ROXICODONE  Take 1-2 tablets (5-10 mg total) by mouth every 3 (three) hours as needed for moderate pain or severe pain.     rivaroxaban 10 MG Tabs tablet  Commonly known as:  XARELTO  Take 1 tablet (10 mg total) by mouth daily with breakfast. Take Xarelto for two and a half more weeks, then discontinue Xarelto. Once the patient has completed the blood thinner regimen, then take a Baby 81 mg Aspirin daily for three more weeks.     simvastatin 40 MG tablet  Commonly known as:  ZOCOR  Take 40 mg by mouth at bedtime.     traMADol 50 MG tablet  Commonly known as:  ULTRAM  Take 1-2 tablets (50-100 mg total) by mouth every 6 (six) hours as needed (mild pain).     triamterene-hydrochlorothiazide 37.5-25 MG tablet  Commonly known as:  MAXZIDE-25  Take 0.5 tablets by mouth every morning.     TYLENOL ARTHRITIS PAIN 650 MG CR tablet  Generic drug:  acetaminophen  Take 650 mg by mouth every 8 (eight) hours as needed for pain.  Follow-up Information    Follow up with [facility].   Why:  home health physical therapy   Contact information:   [facility] [address] [place] [address] [phone]       Follow up with [facility].   Why:  rolling walker and 3n1 (bedside commode)   Contact information:   [address] [place] [address] [phone]       Follow up with Gearlean Alf, MD.  Schedule an appointment as soon as possible for a visit on 06/09/2015.   Specialty:  Orthopedic Surgery   Why:  Call office at [phone] to setup appointment on Tuesday 06/09/2015 with Dr. Denman George information:   [address] [place] [address] [place] [address] [phone]       Signed: Arlee Muslim, PA-C Orthopaedic Surgery 05/28/2015, 9:12 AM

## 2015-05-26 NOTE — Care Management Note (Signed)
Case Management Note  Patient Details  Name: Patricia Morton MRN: [id number] Date of Birth: 1926/12/26  Subjective/Objective:                  TOTAL KNEE ARTHROPLASTY (Right) Action/Plan: Discharge planning Expected Discharge Date:  05/27/15               Expected Discharge Plan:  [place]  In-House Referral:     Discharge planning Services  CM Consult  Post Acute Care Choice:    Choice offered to:  Patient, Adult Children  DME Arranged:  3-N-1, Walker rolling DME Agency:  [place]:  PT [place] Agency:  [facility]  Status of Service:  Completed, signed off  Medicare Important Message Given:    Date Medicare IM Given:    Medicare IM give by:    Date Additional Medicare IM Given:    Additional Medicare Important Message give by:     If discussed at [place] of Stay Meetings, dates discussed:    Additional Comments: CM met with pt and daughter, [name], [phone] (and resides at [place], [place] [address]) in room to offer choice of home health agency.  Family choose [facility] to render HHPT.  Pt will be going home with [name] to recuperate.  Referral given to [employer], Tim.  CM called [facility] to please deliver the 3n1 and rolling walker to room prior to discharge.  No other CM needs were communicated. Dellie Catholic, RN 05/26/2015, 11:08 AM

## 2015-05-26 NOTE — Evaluation (Signed)
Occupational Therapy Evaluation Patient Details Name: Patricia Morton MRN: [id number] DOB: Jan 22, 1927 Today's Date: 05/26/2015    History of Present Illness Pt is an 82 year old female s/p R TKA.   Clinical Impression   Pt was admitted for the above. She will benefit from continued OT in acute to reinforce safety and further educate on bathroom transfers. Goals in acute are for supervision level    Follow Up Recommendations  Supervision/Assistance - 24 hour    Equipment Recommendations  3 in 1 bedside comode    Recommendations for Other Services       Precautions / Restrictions Precautions Precautions: Fall;Knee Required Braces or Orthoses: Knee Immobilizer - Right Restrictions Weight Bearing Restrictions: No Other Position/Activity Restrictions: WBAT      Mobility Bed Mobility         Supine to sit: HOB elevated;Min guard     General bed mobility comments: for safety  Transfers   Equipment used: Rolling walker (2 wheeled) Transfers: Sit to/from [facility] Sit to Stand: Min guard Stand pivot transfers: Min guard       General transfer comment: cues for UE/LE placement, sequence and safety    Balance                                            ADL Overall ADL's : Needs assistance/impaired     Grooming: Set up;Sitting   Upper Body Bathing: Set up;Sitting   Lower Body Bathing: Minimal assistance;Sit to/from stand   Upper Body Dressing : Set up;Sitting   Lower Body Dressing: Moderate assistance;Sit to/from stand   Toilet Transfer: Min guard;Stand-pivot;RW (chair)   Toileting- Water quality scientist and Hygiene: Min guard;Sit to/from stand         General ADL Comments: pt moves quickly:  very motivated.  Cued for safety. Daughter came during session.  Reviewed shower transfer with daughter only as she will not be here in the am     Vision     Perception     Praxis      Pertinent Vitals/Pain Pain  Assessment: Faces Faces Pain Scale: Hurts little more Pain Location: RLE with weight bearing Pain Descriptors / Indicators: Aching Pain Intervention(s): Limited activity within patient's tolerance;Monitored during session;Premedicated before session;Repositioned;Ice applied     Hand Dominance     Extremity/Trunk Assessment Upper Extremity Assessment Upper Extremity Assessment: Overall WFL for tasks assessed (states RUE has difficulty sustaining grasp above head)           Communication Communication Communication: HOH   Cognition Arousal/Alertness: Awake/alert Behavior During Therapy: WFL for tasks assessed/performed;Impulsive (moves quickly, cues for safety) Overall Cognitive Status: Within Functional Limits for tasks assessed                     General Comments       Exercises       Shoulder Instructions      Home Living Family/patient expects to be discharged to:: Private residence Living Arrangements: Alone Available Help at Discharge: Family               Bathroom Shower/Tub: Walk-in Psychologist, prison and probation services: Standard         Additional Comments: going to daughter's home as above      Prior Functioning/Environment Level of Independence: Independent  OT Diagnosis: Acute pain   OT Problem List: Decreased strength;Decreased knowledge of use of DME or AE;Pain   OT Treatment/Interventions: Self-care/ADL training;DME and/or AE instruction;Patient/family education    OT Goals(Current goals can be found in the care plan section) Acute Rehab OT Goals Patient Stated Goal: get back to being independent OT Goal Formulation: With patient Time For Goal Achievement: 06/02/15 Potential to Achieve Goals: Good ADL Goals Pt Will Transfer to Toilet: with supervision;ambulating;bedside commode Pt Will Perform Tub/Shower Transfer: Shower transfer;ambulating;3 in 1;with supervision  OT Frequency: Min 2X/week   Barriers to D/C:             Co-evaluation              End of Session CPM Right Knee CPM Right Knee: Off  Activity Tolerance: Patient tolerated treatment well Patient left: in chair;with call bell/phone within reach;with chair alarm set;with family/visitor present   Time: [id number] OT Time Calculation (min): 29 min Charges:  OT General Charges $OT Visit: 1 Procedure OT Evaluation $OT Eval Low Complexity: 1 Procedure OT Treatments $Self Care/Home Management : 8-22 mins G-Codes:    Shaka Cardin 2015-06-21, 8:24 AM Lesle Chris, OTR/L [phone] 21-Jun-2015

## 2015-05-26 NOTE — Progress Notes (Addendum)
   Subjective: 1 Day Post-Op Procedure(s) (LRB): TOTAL KNEE ARTHROPLASTY (Right) Patient reports pain as mild.   Patient seen in rounds with Dr. Wynelle Link. Patient is well, but has had some minor complaints of pain in the knee, requiring pain medications We will start therapy today.  Already doing SLR's. Plan is to go Home after hospital stay.  Objective: Vital signs in last 24 hours: Temp:  [96.4 F (35.8 C)-98.8 F (37.1 C)] 97.5 F (36.4 C) (03/14 0600) Pulse Rate:  [49-80] 72 (03/14 0654) Resp:  [11-18] 15 (03/14 0654) BP: (105-134)/(50-86) 134/72 mmHg (03/14 0600) SpO2:  [94 %-100 %] 97 % (03/14 0654)  Intake/Output from previous day:  Intake/Output Summary (Last 24 hours) at 05/26/15 0807 Last data filed at 05/26/15 0654  Gross per 24 hour  Intake 3728.75 ml  Output   2810 ml  Net 918.75 ml    Intake/Output this shift: UOP 775  Labs:  Recent Labs  05/26/15 0401  HGB 10.4*    Recent Labs  05/26/15 0401  WBC 10.1  RBC 3.46*  HCT 31.3*  PLT 255    Recent Labs  05/26/15 0401  NA 142  K 4.3  CL 110  CO2 25  BUN 15  CREATININE 0.80  GLUCOSE 137*  CALCIUM 8.6*   No results for input(s): LABPT, INR in the last 72 hours.  EXAM General - Patient is Alert, Appropriate and Oriented Extremity - Neurovascular intact Sensation intact distally Dorsiflexion/Plantar flexion intact Dressing - dressing C/D/I Motor Function - intact, moving foot and toes well on exam.  Hemovac pulled without difficulty.  Past Medical History  Diagnosis Date  . Hypertension   . Hypothyroid   . Hyperlipemia   . Arthritis   . Headache     migraines-none for last 30 years    Assessment/Plan: 1 Day Post-Op Procedure(s) (LRB): TOTAL KNEE ARTHROPLASTY (Right) Principal Problem:   OA (osteoarthritis) of knee  Estimated body mass index is 26.29 kg/(m^2) as calculated from the following:   Height as of this encounter: 5\' 3"  (1.6 m).   Weight as of this encounter: 67.314  kg (148 lb 6.4 oz). Advance diet Up with therapy Plan for discharge tomorrow Discharge home with home health  DVT Prophylaxis - Xarelto Weight-Bearing as tolerated to right leg D/C O2 and Pulse OX and try on Room Air  Arlee Muslim, PA-C Orthopaedic Surgery 05/26/2015, 8:07 AM

## 2015-05-27 LAB — CBC
HCT: 30.8 % — ABNORMAL LOW (ref 36.0–46.0)
HEMOGLOBIN: 10 g/dL — AB (ref 12.0–15.0)
MCH: 29.6 pg (ref 26.0–34.0)
MCHC: 32.5 g/dL (ref 30.0–36.0)
MCV: 91.1 fL (ref 78.0–100.0)
PLATELETS: 274 10*3/uL (ref 150–400)
RBC: 3.38 MIL/uL — AB (ref 3.87–5.11)
RDW: 14.4 % (ref 11.5–15.5)
WBC: 11.6 10*3/uL — AB (ref 4.0–10.5)

## 2015-05-27 LAB — BASIC METABOLIC PANEL
ANION GAP: 6 (ref 5–15)
BUN: 15 mg/dL (ref 6–20)
CHLORIDE: 110 mmol/L (ref 101–111)
CO2: 27 mmol/L (ref 22–32)
Calcium: 8.8 mg/dL — ABNORMAL LOW (ref 8.9–10.3)
Creatinine, Ser: 0.69 mg/dL (ref 0.44–1.00)
Glucose, Bld: 115 mg/dL — ABNORMAL HIGH (ref 65–99)
POTASSIUM: 4.4 mmol/L (ref 3.5–5.1)
SODIUM: 143 mmol/L (ref 135–145)

## 2015-05-27 NOTE — Progress Notes (Signed)
   Subjective: 2 Days Post-Op Procedure(s) (LRB): TOTAL KNEE ARTHROPLASTY (Right) Patient reports pain as mild.   Patient seen in rounds for Dr. Wynelle Link.  She is doing great this morning. Already bending about 90 degrees actively. Patient is well, and has had no acute complaints or problems Patient is ready to go home  Objective: Vital signs in last 24 hours: Temp:  [97.8 F (36.6 C)-98.8 F (37.1 C)] 97.8 F (36.6 C) (03/15 0612) Pulse Rate:  [66-68] 68 (03/15 0612) Resp:  [16] 16 (03/15 0612) BP: (120-159)/(52-74) 159/70 mmHg (03/15 0612) SpO2:  [95 %-98 %] 97 % (03/15 0612)  Intake/Output from previous day:  Intake/Output Summary (Last 24 hours) at 05/27/15 1036 Last data filed at 05/27/15 0936  Gross per 24 hour  Intake   1780 ml  Output   1375 ml  Net    405 ml    Intake/Output this shift: Total I/O In: 240 [P.O.:240] Out: -   Labs:  Recent Labs  05/26/15 0401 05/27/15 0416  HGB 10.4* 10.0*    Recent Labs  05/26/15 0401 05/27/15 0416  WBC 10.1 11.6*  RBC 3.46* 3.38*  HCT 31.3* 30.8*  PLT 255 274    Recent Labs  05/26/15 0401 05/27/15 0416  NA 142 143  K 4.3 4.4  CL 110 110  CO2 25 27  BUN 15 15  CREATININE 0.80 0.69  GLUCOSE 137* 115*  CALCIUM 8.6* 8.8*   No results for input(s): LABPT, INR in the last 72 hours.  EXAM: General - Patient is Alert, Appropriate and Oriented Extremity - Neurovascular intact Sensation intact distally Dorsiflexion/Plantar flexion intact Incision - clean, dry, no drainage Motor Function - intact, moving foot and toes well on exam.   Assessment/Plan: 2 Days Post-Op Procedure(s) (LRB): TOTAL KNEE ARTHROPLASTY (Right) Procedure(s) (LRB): TOTAL KNEE ARTHROPLASTY (Right) Past Medical History  Diagnosis Date  . Hypertension   . Hypothyroid   . Hyperlipemia   . Arthritis   . Headache     migraines-none for last 30 years   Principal Problem:   OA (osteoarthritis) of knee  Estimated body mass index is  26.29 kg/(m^2) as calculated from the following:   Height as of this encounter: 5\' 3"  (1.6 m).   Weight as of this encounter: 67.314 kg (148 lb 6.4 oz). Up with therapy Discharge home with home health Diet - Cardiac diet Follow up - in 2 weeks Activity - WBAT Disposition - Home Condition Upon Discharge - Good D/C Meds - See DC Summary DVT Prophylaxis - Xarelto  Arlee Muslim, PA-C Orthopaedic Surgery 05/27/2015, 10:36 AM

## 2015-05-27 NOTE — Progress Notes (Signed)
[facility] is providing the following services: Rolling walker/Commode  If patient discharges after hours, please call [phone].   Linward Headland 05/27/2015, 9:28 AM

## 2015-05-27 NOTE — Progress Notes (Signed)
Occupational Therapy Treatment Patient Details Name: Patricia Morton MRN: [id number] DOB: 04/02/1926 Today's Date: 05/27/2015    History of present illness Pt is an 82 year old female s/p R TKA.   OT comments  Pt is at min guard level for transfers; supervision for adls.  No further OT needs at this time  Follow Up Recommendations  Supervision/Assistance - 24 hour    Equipment Recommendations  3 in 1 bedside comode    Recommendations for Other Services      Precautions / Restrictions Precautions Precautions: Fall;Knee Restrictions Weight Bearing Restrictions: No Other Position/Activity Restrictions: WBAT       Mobility Bed Mobility   Bed Mobility: Sit to Supine     Supine to sit: Supervision     General bed mobility comments: HOB raised  Transfers   Equipment used: Rolling walker (2 wheeled) Transfers: Sit to/from Stand Sit to Stand: Supervision         General transfer comment: cues for UE/LE placement    Balance                                   ADL                           Toilet Transfer: Min guard;Ambulation;Comfort height toilet;RW       Tub/ Shower Transfer: Min guard;Ambulation;Walk-in shower     General ADL Comments: min guard for safety. Pt moves quickly. Cues for hand and LE placement for sit to stand.  Pt was able to don clothing at supervision level, sit to stand (pants underwear and slip on shoes--no socks)      Vision                     Perception     Praxis      Cognition   Behavior During Therapy: WFL for tasks assessed/performed Overall Cognitive Status: Within Functional Limits for tasks assessed                       Extremity/Trunk Assessment               Exercises     Shoulder Instructions       General Comments      Pertinent Vitals/ Pain       Pain Score: 4  Pain Location: R knee Pain Descriptors / Indicators: Sore Pain Intervention(s): Limited activity  within patient's tolerance;Monitored during session;Premedicated before session;Repositioned;Ice applied  Home Living                                          Prior Functioning/Environment              Frequency       Progress Toward Goals  OT Goals(current goals can now be found in the care plan section)  Progress towards OT goals: Progressing toward goals (at min guard level:  does not need follow up OT)     Plan      Co-evaluation                 End of Session CPM Right Knee CPM Right Knee: Off   Activity Tolerance Patient tolerated treatment well   Patient Left in chair;with call  bell/phone within reach;with chair alarm set   Nurse Communication          Time: [phone] OT Time Calculation (min): 18 min  Charges: OT General Charges $OT Visit: 1 Procedure OT Treatments $Self Care/Home Management : 8-22 mins  Tirrell Buchberger 05/27/2015, 10:52 AM Lesle Chris, OTR/L [phone] 05/27/2015

## 2015-05-27 NOTE — Progress Notes (Signed)
Physical Therapy Treatment Patient Details Name: MICA VILL MRN: [id number] DOB: [birth year] Today's Date: 06/04/2015    History of Present Illness Pt is an 82 year old female s/p R TKA.    PT Comments    Pt is making great progress and ready for d/c home today.  Follow Up Recommendations  Home health PT     Equipment Recommendations  Rolling walker with 5" wheels    Recommendations for Other Services       Precautions / Restrictions Precautions Precautions: Fall;Knee Precaution Comments: able to perform SLR Restrictions Other Position/Activity Restrictions: WBAT    Mobility  Bed Mobility   Bed Mobility: Sit to Supine     Supine to sit: Supervision     General bed mobility comments: HOB raised  Transfers Overall transfer level: Needs assistance Equipment used: Rolling walker (2 wheeled) Transfers: Sit to/from Stand Sit to Stand: Supervision         General transfer comment: verbal cues for UE positioning  Ambulation/Gait Ambulation/Gait assistance: Supervision Ambulation Distance (Feet): 180 Feet Assistive device: Rolling walker (2 wheeled) Gait Pattern/deviations: Step-through pattern;Antalgic     General Gait Details: verbal cues for RW positioning   Stairs            Wheelchair Mobility    Modified Rankin (Stroke Patients Only)       Balance                                    Cognition Arousal/Alertness: Awake/alert Behavior During Therapy: WFL for tasks assessed/performed Overall Cognitive Status: Within Functional Limits for tasks assessed                      Exercises Total Joint Exercises Ankle Circles/Pumps: AROM;Both;10 reps Quad Sets: 10 reps;AROM;Both Short Arc Quad: AROM;Right;10 reps Heel Slides: AAROM;Right;10 reps Hip ABduction/ADduction: Right;10 reps;AROM Straight Leg Raises: AROM;Right;10 reps Goniometric ROM: R knee AAROM 95* in recliner    General Comments        Pertinent  Vitals/Pain Pain Assessment: 0-10 Pain Score: 2  Pain Location: R knee Pain Descriptors / Indicators: Aching;Sore Pain Intervention(s): Limited activity within patient's tolerance;Monitored during session;Repositioned;Ice applied    Home Living                      Prior Function            PT Goals (current goals can now be found in the care plan section) Progress towards PT goals: Progressing toward goals    Frequency  7X/week    PT Plan Current plan remains appropriate    Co-evaluation             End of Session   Activity Tolerance: Patient tolerated treatment well Patient left: with call bell/phone within reach;in chair;with chair alarm set     Time: 1100-1111 PT Time Calculation (min) (ACUTE ONLY): 11 min  Charges:  $Therapeutic Exercise: 8-22 mins                    G Codes:      Kikuye Korenek,KATHrine E 06-04-15, 12:51 PM Carmelia Bake, PT, DPT 04-Jun-2015 Pager: [phone]

## 2015-05-27 NOTE — Progress Notes (Signed)
Pt to d/c home with [facility] home health. DME delivered to room prior to d/c. AVS reviewed and "My Chart" discussed with pt. Pt capable of verbalizing medications, dressing changes, signs and symptoms of infection, and follow-up appointments. Remains hemodynamically stable. No signs and symptoms of distress. Educated pt to return to ER in the case of SOB, dizziness, or chest pain.  

## 2015-11-18 ENCOUNTER — Ambulatory Visit: Payer: Self-pay | Admitting: Orthopedic Surgery

## 2015-12-02 NOTE — Patient Instructions (Addendum)
Patricia Morton  12/02/2015   Your procedure is scheduled on: 12/14/2015    Report to [facility] Main  Entrance take [place]  elevators to 3rd floor to  [place] at   0730 AM.  Call this number if you have problems the morning of surgery [phone]   Remember: ONLY 1 PERSON MAY GO WITH YOU TO SHORT STAY TO GET  READY MORNING OF [place].  Do not eat food or drink liquids :After Midnight.     Take these medicines the morning of surgery with A SIP OF WATER: Synthroid, Metoprolol ( Lopressor) DO NOT TAKE ANY DIABETIC MEDICATIONS DAY OF YOUR SURGERY                               You may not have any metal on your body including hair pins and              piercings  Do not wear jewelry, make-up, lotions, powders or perfumes, deodorant             Do not wear nail polish.  Do not shave  48 hours prior to surgery.          Do not bring valuables to the hospital. [place].  Contacts, dentures or bridgework may not be worn into surgery.  Leave suitcase in the car. After surgery it may be brought to your room.       Special Instructions: N/A              Please read over the following fact sheets you were given: _____________________________________________________________________             [facility] - Preparing for Surgery Before surgery, you can play an important role.  Because skin is not sterile, your skin needs to be as free of germs as possible.  You can reduce the number of germs on your skin by washing with CHG (chlorahexidine gluconate) soap before surgery.  CHG is an antiseptic cleaner which kills germs and bonds with the skin to continue killing germs even after washing. Please DO NOT use if you have an allergy to CHG or antibacterial soaps.  If your skin becomes reddened/irritated stop using the CHG and inform your nurse when you arrive at Short Stay. Do not shave (including legs and  underarms) for at least 48 hours prior to the first CHG shower.  You may shave your face/neck. Please follow these instructions carefully:  1.  Shower with CHG Soap the night before surgery and the  morning of Surgery.  2.  If you choose to wash your hair, wash your hair first as usual with your  normal  shampoo.  3.  After you shampoo, rinse your hair and body thoroughly to remove the  shampoo.                           4.  Use CHG as you would any other liquid soap.  You can apply chg directly  to the skin and wash                       Gently with a scrungie or  clean washcloth.  5.  Apply the CHG Soap to your body ONLY FROM THE NECK DOWN.   Do not use on face/ open                           Wound or open sores. Avoid contact with eyes, ears mouth and genitals (private parts).                       Wash face,  Genitals (private parts) with your normal soap.             6.  Wash thoroughly, paying special attention to the area where your surgery  will be performed.  7.  Thoroughly rinse your body with warm water from the neck down.  8.  DO NOT shower/wash with your normal soap after using and rinsing off  the CHG Soap.                9.  Pat yourself dry with a clean towel.            10.  Wear clean pajamas.            11.  Place clean sheets on your bed the night of your first shower and do not  sleep with pets. Day of Surgery : Do not apply any lotions/deodorants the morning of surgery.  Please wear clean clothes to the hospital/surgery center.  FAILURE TO FOLLOW THESE INSTRUCTIONS MAY RESULT IN THE CANCELLATION OF YOUR SURGERY PATIENT SIGNATURE_________________________________  NURSE SIGNATURE__________________________________  ________________________________________________________________________  WHAT IS A BLOOD TRANSFUSION? Blood Transfusion Information  A transfusion is the replacement of blood or some of its parts. Blood is made up of multiple cells which provide different  functions.  Red blood cells carry oxygen and are used for blood loss replacement.  White blood cells fight against infection.  Platelets control bleeding.  Plasma helps clot blood.  Other blood products are available for specialized needs, such as hemophilia or other clotting disorders. BEFORE THE TRANSFUSION  Who gives blood for transfusions?   Healthy volunteers who are fully evaluated to make sure their blood is safe. This is blood bank blood. Transfusion therapy is the safest it has ever been in the practice of medicine. Before blood is taken from a donor, a complete history is taken to make sure that person has no history of diseases nor engages in risky social behavior (examples are intravenous drug use or sexual activity with multiple partners). The donor's travel history is screened to minimize risk of transmitting infections, such as malaria. The donated blood is tested for signs of infectious diseases, such as HIV and hepatitis. The blood is then tested to be sure it is compatible with you in order to minimize the chance of a transfusion reaction. If you or a relative donates blood, this is often done in anticipation of surgery and is not appropriate for emergency situations. It takes many days to process the donated blood. RISKS AND COMPLICATIONS Although transfusion therapy is very safe and saves many lives, the main dangers of transfusion include:   Getting an infectious disease.  Developing a transfusion reaction. This is an allergic reaction to something in the blood you were given. Every precaution is taken to prevent this. The decision to have a blood transfusion has been considered carefully by your caregiver before blood is given. Blood is not given unless the benefits outweigh the risks. AFTER THE TRANSFUSION  Right after receiving a blood transfusion, you will usually feel much better and more energetic. This is especially true if your red blood cells have gotten low  (anemic). The transfusion raises the level of the red blood cells which carry oxygen, and this usually causes an energy increase.  The nurse administering the transfusion will monitor you carefully for complications. HOME CARE INSTRUCTIONS  No special instructions are needed after a transfusion. You may find your energy is better. Speak with your caregiver about any limitations on activity for underlying diseases you may have. SEEK MEDICAL CARE IF:   Your condition is not improving after your transfusion.  You develop redness or irritation at the intravenous (IV) site. SEEK IMMEDIATE MEDICAL CARE IF:  Any of the following symptoms occur over the next 12 hours:  Shaking chills.  You have a temperature by mouth above 102 F (38.9 C), not controlled by medicine.  Chest, back, or muscle pain.  People around you feel you are not acting correctly or are confused.  Shortness of breath or difficulty breathing.  Dizziness and fainting.  You get a rash or develop hives.  You have a decrease in urine output.  Your urine turns a dark color or changes to pink, red, or brown. Any of the following symptoms occur over the next 10 days:  You have a temperature by mouth above 102 F (38.9 C), not controlled by medicine.  Shortness of breath.  Weakness after normal activity.  The white part of the eye turns yellow (jaundice).  You have a decrease in the amount of urine or are urinating less often.  Your urine turns a dark color or changes to pink, red, or brown. Document Released: 02/26/2000 Document Revised: 05/23/2011 Document Reviewed: 10/15/2007 ExitCare Patient Information 2014 [place].  _______________________________________________________________________  Incentive Spirometer  An incentive spirometer is a tool that can help keep your lungs clear and active. This tool measures how well you are filling your lungs with each breath. Taking long deep breaths may help  reverse or decrease the chance of developing breathing (pulmonary) problems (especially infection) following:  A long period of time when you are unable to move or be active. BEFORE THE PROCEDURE   If the spirometer includes an indicator to show your best effort, your nurse or respiratory therapist will set it to a desired goal.  If possible, sit up straight or lean slightly forward. Try not to slouch.  Hold the incentive spirometer in an upright position. INSTRUCTIONS FOR USE  1. Sit on the edge of your bed if possible, or sit up as far as you can in bed or on a chair. 2. Hold the incentive spirometer in an upright position. 3. Breathe out normally. 4. Place the mouthpiece in your mouth and seal your lips tightly around it. 5. Breathe in slowly and as deeply as possible, raising the piston or the ball toward the top of the column. 6. Hold your breath for 3-5 seconds or for as long as possible. Allow the piston or ball to fall to the bottom of the column. 7. Remove the mouthpiece from your mouth and breathe out normally. 8. Rest for a few seconds and repeat Steps 1 through 7 at least 10 times every 1-2 hours when you are awake. Take your time and take a few normal breaths between deep breaths. 9. The spirometer may include an indicator to show your best effort. Use the indicator as a goal to work toward during each repetition. 10. After  each set of 10 deep breaths, practice coughing to be sure your lungs are clear. If you have an incision (the cut made at the time of surgery), support your incision when coughing by placing a pillow or rolled up towels firmly against it. Once you are able to get out of bed, walk around indoors and cough well. You may stop using the incentive spirometer when instructed by your caregiver.  RISKS AND COMPLICATIONS  Take your time so you do not get dizzy or light-headed.  If you are in pain, you may need to take or ask for pain medication before doing incentive  spirometry. It is harder to take a deep breath if you are having pain. AFTER USE  Rest and breathe slowly and easily.  It can be helpful to keep track of a log of your progress. Your caregiver can provide you with a simple table to help with this. If you are using the spirometer at home, follow these instructions: [place] IF:   You are having difficultly using the spirometer.  You have trouble using the spirometer as often as instructed.  Your pain medication is not giving enough relief while using the spirometer.  You develop fever of 100.5 F (38.1 C) or higher. SEEK IMMEDIATE MEDICAL CARE IF:   You cough up bloody sputum that had not been present before.  You develop fever of 102 F (38.9 C) or greater.  You develop worsening pain at or near the incision site. MAKE SURE YOU:   Understand these instructions.  Will watch your condition.  Will get help right away if you are not doing well or get worse. Document Released: 07/11/2006 Document Revised: 05/23/2011 Document Reviewed: 09/11/2006 [facility] Patient Information 2014 [place], [place].   ________________________________________________________________________

## 2015-12-04 ENCOUNTER — Encounter (HOSPITAL_COMMUNITY)
Admission: RE | Admit: 2015-12-04 | Discharge: 2015-12-04 | Disposition: A | Discharge status: Home / Self Care | Payer: Medicare Other | Source: Ambulatory Visit | Attending: Orthopedic Surgery | Admitting: Orthopedic Surgery

## 2015-12-04 ENCOUNTER — Ambulatory Visit: Payer: Self-pay | Admitting: Orthopedic Surgery

## 2015-12-04 ENCOUNTER — Encounter (HOSPITAL_COMMUNITY): Payer: Self-pay

## 2015-12-04 DIAGNOSIS — M1712 Unilateral primary osteoarthritis, left knee: Secondary | ICD-10-CM | POA: Insufficient documentation

## 2015-12-04 DIAGNOSIS — Z01812 Encounter for preprocedural laboratory examination: Secondary | ICD-10-CM | POA: Insufficient documentation

## 2015-12-04 LAB — COMPREHENSIVE METABOLIC PANEL
ALBUMIN: 3.9 g/dL (ref 3.5–5.0)
ALT: 13 U/L — ABNORMAL LOW (ref 14–54)
AST: 20 U/L (ref 15–41)
Alkaline Phosphatase: 61 U/L (ref 38–126)
Anion gap: 9 (ref 5–15)
BUN: 12 mg/dL (ref 6–20)
CHLORIDE: 101 mmol/L (ref 101–111)
CO2: 28 mmol/L (ref 22–32)
Calcium: 9.1 mg/dL (ref 8.9–10.3)
Creatinine, Ser: 0.79 mg/dL (ref 0.44–1.00)
GFR calc Af Amer: >60 mL/min (ref >60)
Glucose, Bld: 104 mg/dL — ABNORMAL HIGH (ref 65–99)
POTASSIUM: 4.2 mmol/L (ref 3.5–5.1)
SODIUM: 138 mmol/L (ref 135–145)
Total Bilirubin: 0.4 mg/dL (ref 0.3–1.2)
Total Protein: 6.9 g/dL (ref 6.5–8.1)

## 2015-12-04 LAB — URINALYSIS, ROUTINE W REFLEX MICROSCOPIC
BILIRUBIN URINE: NEGATIVE
GLUCOSE, UA: NEGATIVE mg/dL
Hgb urine dipstick: NEGATIVE
KETONES UR: NEGATIVE mg/dL
NITRITE: NEGATIVE
PH: 7 (ref 5.0–8.0)
Protein, ur: NEGATIVE mg/dL
Specific Gravity, Urine: 1.008 (ref 1.005–1.030)

## 2015-12-04 LAB — SURGICAL PCR SCREEN
MRSA, PCR: NEGATIVE
STAPHYLOCOCCUS AUREUS: NEGATIVE

## 2015-12-04 LAB — CBC
HCT: 40.9 % (ref 36.0–46.0)
Hemoglobin: 13.2 g/dL (ref 12.0–15.0)
MCH: 29.1 pg (ref 26.0–34.0)
MCHC: 32.3 g/dL (ref 30.0–36.0)
MCV: 90.3 fL (ref 78.0–100.0)
PLATELETS: 363 10*3/uL (ref 150–400)
RBC: 4.53 MIL/uL (ref 3.87–5.11)
RDW: 15 % (ref 11.5–15.5)
WBC: 6.3 10*3/uL (ref 4.0–10.5)

## 2015-12-04 LAB — URINE MICROSCOPIC-ADD ON: RBC / HPF: NONE SEEN RBC/hpf (ref 0–5)

## 2015-12-04 LAB — APTT: APTT: 30 s (ref 24–36)

## 2015-12-04 LAB — PROTIME-INR
INR: 1
PROTHROMBIN TIME: 13.2 s (ref 11.4–15.2)

## 2015-12-04 NOTE — Progress Notes (Signed)
OR schedule states left knee.  Patient states left knee.  Need new consent order that states left knee.  Thank You.

## 2015-12-04 NOTE — Progress Notes (Signed)
U/A and micro done 12/04/15 faxed via EPIC to Dr Wynelle Link.

## 2015-12-04 NOTE — Progress Notes (Signed)
Clearance- Dr Scotty Court- 11/11/15 on chart  EKG-05/22/15-EPIC

## 2015-12-04 NOTE — Progress Notes (Signed)
Patient states left knee for surgery.  OR schedule states left knee.  Consent form reads" right knee".  Note sent via EPIC to Dr Wynelle Link and Arlee Muslim, [facility] needing new consent order.  Also called and left voice message for Fabio Asa at [facility] regarding above.

## 2015-12-04 NOTE — Progress Notes (Signed)
U/A and micro results faxed via EPIC to Dr Wynelle Link.

## 2015-12-13 ENCOUNTER — Ambulatory Visit: Payer: Self-pay | Admitting: Orthopedic Surgery

## 2015-12-13 NOTE — H&P (Signed)
Patricia Morton DOB: 14-Jan-1927 Widowed / Language: Cleophus Molt / Race: White Female Date of Admission:  12/14/2015 CC: Left knee pain History of Present Illness  The patient is a 82 year old female who comes in for a preoperative History and Physical. The patient is scheduled for a left total knee arthroplasty to be performed by Dr. Dione Plover. Aluisio, MD at [facility] on 12/14/2015. The patient is a 82 year old female presenting several months out from right total knee arthroplasty. The patient states that she is doing very well except for her left knee OA causes pain; taking Tramadol) at this time. The pain is under excellent control at this time and describe their pain as no pain with her right knee. They are currently on no medication for their pain. The patient is currently doing home exercise program (walking). The patient feels that they are progressing well at this time. Note for "Post TKA": Pt. state's she's doing great. She feels that her right knee is doing great at this time. The only thing limiting her now is the arthritis in her left knee. She has end stage arthritis there. She has had junctions in the past which is no longer beneficial. She does want to go ahead and get the left knee replaced as she has been fantastic with regards to the right knee. They have been treated conservatively in the past for the above stated problem and despite conservative measures, they continue to have progressive pain and severe functional limitations and dysfunction. They have failed non-operative management including home exercise, medications. It is felt that they would benefit from undergoing total joint replacement. Risks and benefits of the procedure have been discussed with the patient and they elect to proceed with surgery. There are no active contraindications to surgery such as ongoing infection or rapidly progressive neurological disease.   Problem List/Past Medical Status post total right knee  replacement [id number])  Vertigo  Depression  High blood pressure  Hypercholesterolemia  Hypothyroidism  Osteoarthritis  Menopause  Primary localized osteoarthritis of right knee (M17.11)  Allergies No Known Drug Allergies   Family History Cancer  Maternal Grandmother, Mother. Congestive Heart Failure  Paternal Grandfather. Depression  Brother, Sister. Drug / Alcohol Addiction  Brother, Father, Paternal Grandfather. Heart Disease  Brother, Paternal 71, Sister. Hypertension  Sister. Liver Disease, Chronic  Father. Osteoarthritis  Brother, Sister.  Social History Children  2 Current drinker  02/25/2015: Currently drinks wine only occasionally per week Current work status  retired Exercise  Exercises rarely Living situation  live alone Marital status  widowed No history of drug/alcohol rehab  Not under pain contract  Number of flights of stairs before winded  2-3 Tobacco / smoke exposure  02/25/2015: no Tobacco use  Former smoker. 02/25/2015: smoke(d) less than 1/2 pack(s) per day [place] with Family Advance Directives  Living Will, Healthcare POA  Medication History Gabapentin (300MG  Capsule, Oral) Active. LORazepam (1MG  Tablet, Oral) Active. Metoprolol Tartrate (50MG  Tablet, Oral) Active. Triamterene-HCTZ (37.5-25MG  Tablet, Oral) Active. Levothyroxine Sodium (75MCG Tablet, Oral) Active. Simvastatin (40MG  Tablet, Oral) Active. TraMADol HCl (50MG  Tablet, 1-2 Tablet Oral every 6-8 hours as needed for pain, Taken starting 02/26/2015) Active.   Past Surgical History Breast Biopsy  Date: 15. right Total Knee Replacement - Right [05/25/2015]: Hysterectomy  Date: 39. complete (non-cancerous)   Review of Systems General Not Present- Chills, Fatigue, Fever, Memory Loss, Night Sweats, Weight Gain and Weight Loss. Skin Not Present- Eczema, Hives, Itching, Lesions and Rash. HEENT  Not Present- Dentures,  Double Vision, Headache, Hearing Loss, Tinnitus and Visual Loss. Respiratory Not Present- Allergies, Chronic Cough, Coughing up blood, Shortness of breath at rest and Shortness of breath with exertion. Cardiovascular Not Present- Chest Pain, Difficulty Breathing Lying Down, Murmur, Palpitations, Racing/skipping heartbeats and Swelling. Gastrointestinal Present- Loss of appetitie. Not Present- Abdominal Pain, Bloody Stool, Constipation, Diarrhea, Difficulty Swallowing, Heartburn, Jaundice, Nausea and Vomiting. Female Genitourinary Present- Urinating at Night. Not Present- Blood in Urine, Discharge, Flank Pain, Incontinence, Painful Urination, Urgency, Urinary frequency, Urinary Retention and Weak urinary stream. Musculoskeletal Present- Joint Pain and Joint Swelling. Not Present- Back Pain, Morning Stiffness, Muscle Pain, Muscle Weakness and Spasms. Neurological Not Present- Blackout spells, Difficulty with balance, Dizziness, Paralysis, Tremor and Weakness. Psychiatric Not Present- Insomnia.  Vitals Weight: 149 lb Height: 63in Weight was reported by patient. Height was reported by patient. Body Surface Area: 1.71 m Body Mass Index: 26.39 kg/m  Pulse: 68 (Regular)  BP: 106/52 (Sitting, Right Arm, Standard)   Physical Exam  General Mental Status -Alert, cooperative and good historian. General Appearance-pleasant, Not in acute distress. Orientation-Oriented X3. Build & Nutrition-Well nourished and Well developed.  Head and Neck Head-normocephalic, atraumatic . Neck Global Assessment - supple, no bruit auscultated on the right, no bruit auscultated on the left.  Eye Vision-Wears corrective lenses. Pupil - Bilateral-Regular and Round. Motion - Bilateral-EOMI.  ENMT Note: upper dentures   Chest and Lung Exam Auscultation Breath sounds - clear at anterior chest wall and clear at posterior chest wall. Adventitious sounds - No Adventitious  sounds.  Cardiovascular Auscultation Rhythm - Regular rate and rhythm. Heart Sounds - S1 WNL and S2 WNL. Murmurs & Other Heart Sounds - Auscultation of the heart reveals - No Murmurs.  Abdomen Palpation/Percussion Tenderness - Abdomen is non-tender to palpation. Rigidity (guarding) - Abdomen is soft. Auscultation Auscultation of the abdomen reveals - Bowel sounds normal.  Female Genitourinary Note: Not done, not pertinent to present illness   Musculoskeletal Note: She is in no distress. Her right knee shows no swelling, range 0 to 130 with no tenderness or instability. Left knee, varus deformity, range 5 to 115. Marked crepitus on range of motion. Tender medial greater than lateral with no instability noted.   Assessment & Plan Primary osteoarthritis of left knee (M17.12) Status post total right knee replacement [id number])  Note:Surgical Plans: Left Total Knee Replacement  Disposition: Home  PCP: Dr. Matthias Hughs  IV TXA  Anesthesia Issues: None  Signed electronically by Ok Edwards, III PA-C

## 2015-12-14 ENCOUNTER — Inpatient Hospital Stay (HOSPITAL_COMMUNITY): Payer: Medicare Other | Admitting: Anesthesiology

## 2015-12-14 ENCOUNTER — Inpatient Hospital Stay (HOSPITAL_COMMUNITY)
Admission: RE | Admit: 2015-12-14 | Discharge: 2015-12-16 | DRG: 470 | Disposition: A | Discharge status: Home Health | Payer: Medicare Other | Source: Ambulatory Visit | Attending: Orthopedic Surgery | Admitting: Orthopedic Surgery

## 2015-12-14 ENCOUNTER — Encounter (HOSPITAL_COMMUNITY): Payer: Self-pay | Admitting: *Deleted

## 2015-12-14 ENCOUNTER — Encounter (HOSPITAL_COMMUNITY)
Admission: RE | Disposition: A | Discharge status: Home Health | Payer: Self-pay | Source: Ambulatory Visit | Attending: Orthopedic Surgery

## 2015-12-14 DIAGNOSIS — E785 Hyperlipidemia, unspecified: Secondary | ICD-10-CM | POA: Diagnosis present

## 2015-12-14 DIAGNOSIS — I10 Essential (primary) hypertension: Secondary | ICD-10-CM | POA: Diagnosis present

## 2015-12-14 DIAGNOSIS — M1712 Unilateral primary osteoarthritis, left knee: Secondary | ICD-10-CM | POA: Diagnosis present

## 2015-12-14 DIAGNOSIS — M1710 Unilateral primary osteoarthritis, unspecified knee: Secondary | ICD-10-CM | POA: Diagnosis present

## 2015-12-14 DIAGNOSIS — M179 Osteoarthritis of knee, unspecified: Secondary | ICD-10-CM | POA: Diagnosis present

## 2015-12-14 DIAGNOSIS — E039 Hypothyroidism, unspecified: Secondary | ICD-10-CM | POA: Diagnosis present

## 2015-12-14 HISTORY — PX: TOTAL KNEE ARTHROPLASTY: SHX125

## 2015-12-14 LAB — TYPE AND SCREEN
ABO/RH(D): A POS
ANTIBODY SCREEN: NEGATIVE

## 2015-12-14 SURGERY — ARTHROPLASTY, KNEE, TOTAL
Anesthesia: Spinal | Site: Knee | Laterality: Left

## 2015-12-14 MED ORDER — METHOCARBAMOL 1000 MG/10ML IJ SOLN
500.0000 mg | Freq: Four times a day (QID) | INTRAVENOUS | Status: DC | PRN
  Administered 2015-12-14: 500 mg via INTRAVENOUS
  Filled 2015-12-14: qty 550
  Filled 2015-12-14: qty 5

## 2015-12-14 MED ORDER — CEFAZOLIN SODIUM-DEXTROSE 2-4 GM/100ML-% IV SOLN
2.0000 g | INTRAVENOUS | Status: AC
  Administered 2015-12-14: 2 g via INTRAVENOUS

## 2015-12-14 MED ORDER — DIPHENHYDRAMINE HCL 12.5 MG/5ML PO ELIX: 12.5000 mg | ORAL_SOLUTION | ORAL | Status: DC | PRN

## 2015-12-14 MED ORDER — TRAMADOL HCL 50 MG PO TABS: 50.0000 mg | ORAL_TABLET | Freq: Four times a day (QID) | ORAL | Status: DC | PRN

## 2015-12-14 MED ORDER — HYDROMORPHONE HCL 1 MG/ML IJ SOLN: 0.2500 mg | INTRAMUSCULAR | Status: DC | PRN

## 2015-12-14 MED ORDER — LORAZEPAM 1 MG PO TABS
1.0000 mg | ORAL_TABLET | Freq: Every day | ORAL | Status: DC
  Administered 2015-12-14 – 2015-12-15 (×2): 1 mg via ORAL
  Filled 2015-12-14 (×2): qty 1

## 2015-12-14 MED ORDER — ONDANSETRON HCL 4 MG/2ML IJ SOLN: 4.0000 mg | Freq: Once | INTRAMUSCULAR | Status: DC | PRN

## 2015-12-14 MED ORDER — METOCLOPRAMIDE HCL 5 MG PO TABS: 5.0000 mg | ORAL_TABLET | Freq: Three times a day (TID) | ORAL | Status: DC | PRN

## 2015-12-14 MED ORDER — DEXAMETHASONE SODIUM PHOSPHATE 10 MG/ML IJ SOLN
10.0000 mg | Freq: Once | INTRAMUSCULAR | Status: AC
  Administered 2015-12-14: 10 mg via INTRAVENOUS

## 2015-12-14 MED ORDER — CHLORHEXIDINE GLUCONATE 4 % EX LIQD: 60.0000 mL | Freq: Once | CUTANEOUS | Status: DC

## 2015-12-14 MED ORDER — DEXAMETHASONE SODIUM PHOSPHATE 10 MG/ML IJ SOLN
10.0000 mg | Freq: Once | INTRAMUSCULAR | Status: AC
  Administered 2015-12-15: 10 mg via INTRAVENOUS
  Filled 2015-12-14: qty 1

## 2015-12-14 MED ORDER — CEFAZOLIN SODIUM-DEXTROSE 2-4 GM/100ML-% IV SOLN
INTRAVENOUS | Status: AC
Start: 2015-12-14 — End: 2015-12-14
  Filled 2015-12-14: qty 100

## 2015-12-14 MED ORDER — ACETAMINOPHEN 10 MG/ML IV SOLN
1000.0000 mg | Freq: Once | INTRAVENOUS | Status: AC
  Administered 2015-12-14: 1000 mg via INTRAVENOUS
  Filled 2015-12-14: qty 100

## 2015-12-14 MED ORDER — SODIUM CHLORIDE 0.9 % IV SOLN
1000.0000 mg | Freq: Once | INTRAVENOUS | Status: AC
  Administered 2015-12-14: 1000 mg via INTRAVENOUS
  Filled 2015-12-14: qty 10

## 2015-12-14 MED ORDER — FENTANYL CITRATE (PF) 100 MCG/2ML IJ SOLN
INTRAMUSCULAR | Status: DC | PRN
  Administered 2015-12-14 (×2): 25 ug via INTRAVENOUS

## 2015-12-14 MED ORDER — ACETAMINOPHEN 500 MG PO TABS
1000.0000 mg | ORAL_TABLET | Freq: Four times a day (QID) | ORAL | Status: AC
  Administered 2015-12-14 – 2015-12-15 (×4): 1000 mg via ORAL
  Filled 2015-12-14 (×4): qty 2

## 2015-12-14 MED ORDER — OXYCODONE HCL 5 MG PO TABS
5.0000 mg | ORAL_TABLET | ORAL | Status: DC | PRN
  Administered 2015-12-14: 5 mg via ORAL
  Administered 2015-12-14 (×2): 10 mg via ORAL
  Administered 2015-12-14: 5 mg via ORAL
  Administered 2015-12-15 – 2015-12-16 (×7): 10 mg via ORAL
  Filled 2015-12-14 (×3): qty 2
  Filled 2015-12-14: qty 1
  Filled 2015-12-14 (×5): qty 2
  Filled 2015-12-14: qty 1
  Filled 2015-12-14 (×2): qty 2

## 2015-12-14 MED ORDER — BUPIVACAINE LIPOSOME 1.3 % IJ SUSP
INTRAMUSCULAR | Status: DC | PRN
  Administered 2015-12-14: 20 mL

## 2015-12-14 MED ORDER — GABAPENTIN 300 MG PO CAPS: 300.0000 mg | ORAL_CAPSULE | Freq: Every day | ORAL | Status: DC | PRN

## 2015-12-14 MED ORDER — METOCLOPRAMIDE HCL 5 MG/ML IJ SOLN
5.0000 mg | Freq: Three times a day (TID) | INTRAMUSCULAR | Status: DC | PRN
Start: 2015-12-14 — End: 2015-12-16

## 2015-12-14 MED ORDER — BISACODYL 10 MG RE SUPP: 10.0000 mg | Freq: Every day | RECTAL | Status: DC | PRN

## 2015-12-14 MED ORDER — CEFAZOLIN SODIUM-DEXTROSE 2-4 GM/100ML-% IV SOLN
2.0000 g | Freq: Four times a day (QID) | INTRAVENOUS | Status: AC
  Administered 2015-12-14 (×2): 2 g via INTRAVENOUS
  Filled 2015-12-14 (×2): qty 100

## 2015-12-14 MED ORDER — PROPOFOL 10 MG/ML IV BOLUS
INTRAVENOUS | Status: AC
  Filled 2015-12-14: qty 40

## 2015-12-14 MED ORDER — PROPOFOL 500 MG/50ML IV EMUL
INTRAVENOUS | Status: DC | PRN
  Administered 2015-12-14: 75 ug/kg/min via INTRAVENOUS

## 2015-12-14 MED ORDER — POLYETHYLENE GLYCOL 3350 17 G PO PACK: 17.0000 g | PACK | Freq: Every day | ORAL | Status: DC | PRN

## 2015-12-14 MED ORDER — PHENOL 1.4 % MT LIQD: 1.0000 | OROMUCOSAL | Status: DC | PRN

## 2015-12-14 MED ORDER — 0.9 % SODIUM CHLORIDE (POUR BTL) OPTIME
TOPICAL | Status: DC | PRN
  Administered 2015-12-14: 1000 mL

## 2015-12-14 MED ORDER — FLEET ENEMA 7-19 GM/118ML RE ENEM: 1.0000 | ENEMA | Freq: Once | RECTAL | Status: DC | PRN

## 2015-12-14 MED ORDER — METOPROLOL TARTRATE 50 MG PO TABS
50.0000 mg | ORAL_TABLET | Freq: Two times a day (BID) | ORAL | Status: DC
  Administered 2015-12-14 – 2015-12-16 (×4): 50 mg via ORAL
  Filled 2015-12-14 (×4): qty 1

## 2015-12-14 MED ORDER — STERILE WATER FOR IRRIGATION IR SOLN
Status: DC | PRN
  Administered 2015-12-14: 2000 mL

## 2015-12-14 MED ORDER — BUPIVACAINE IN DEXTROSE 0.75-8.25 % IT SOLN
INTRATHECAL | Status: DC | PRN
  Administered 2015-12-14: 2 mL via INTRATHECAL

## 2015-12-14 MED ORDER — DOCUSATE SODIUM 100 MG PO CAPS
100.0000 mg | ORAL_CAPSULE | Freq: Two times a day (BID) | ORAL | Status: DC
  Administered 2015-12-14 – 2015-12-16 (×4): 100 mg via ORAL
  Filled 2015-12-14 (×4): qty 1

## 2015-12-14 MED ORDER — FENTANYL CITRATE (PF) 100 MCG/2ML IJ SOLN
INTRAMUSCULAR | Status: AC
  Filled 2015-12-14: qty 2

## 2015-12-14 MED ORDER — MORPHINE SULFATE (PF) 2 MG/ML IV SOLN
1.0000 mg | INTRAVENOUS | Status: DC | PRN
Start: 2015-12-14 — End: 2015-12-16
  Administered 2015-12-14: 1 mg via INTRAVENOUS
  Filled 2015-12-14: qty 1

## 2015-12-14 MED ORDER — BUPIVACAINE HCL (PF) 0.25 % IJ SOLN
INTRAMUSCULAR | Status: AC
  Filled 2015-12-14: qty 30

## 2015-12-14 MED ORDER — ACETAMINOPHEN 325 MG PO TABS: 650.0000 mg | ORAL_TABLET | Freq: Four times a day (QID) | ORAL | Status: DC | PRN

## 2015-12-14 MED ORDER — TRIAMTERENE-HCTZ 37.5-25 MG PO TABS
0.5000 | ORAL_TABLET | Freq: Every day | ORAL | Status: DC
  Administered 2015-12-16: 0.5 via ORAL
  Filled 2015-12-14 (×2): qty 0.5

## 2015-12-14 MED ORDER — DEXAMETHASONE SODIUM PHOSPHATE 10 MG/ML IJ SOLN
INTRAMUSCULAR | Status: AC
  Filled 2015-12-14: qty 1

## 2015-12-14 MED ORDER — ONDANSETRON HCL 4 MG/2ML IJ SOLN
INTRAMUSCULAR | Status: DC | PRN
Start: 2015-12-14 — End: 2015-12-14
  Administered 2015-12-14: 4 mg via INTRAVENOUS

## 2015-12-14 MED ORDER — METHOCARBAMOL 500 MG PO TABS
500.0000 mg | ORAL_TABLET | Freq: Four times a day (QID) | ORAL | Status: DC | PRN
  Administered 2015-12-15: 500 mg via ORAL
  Filled 2015-12-14: qty 1

## 2015-12-14 MED ORDER — ONDANSETRON HCL 4 MG/2ML IJ SOLN
INTRAMUSCULAR | Status: AC
  Filled 2015-12-14: qty 2

## 2015-12-14 MED ORDER — ONDANSETRON HCL 4 MG/2ML IJ SOLN
INTRAMUSCULAR | Status: AC
Start: 2015-12-14 — End: 2015-12-14
  Filled 2015-12-14: qty 2

## 2015-12-14 MED ORDER — LACTATED RINGERS IV SOLN
INTRAVENOUS | Status: DC
  Administered 2015-12-14 (×2): via INTRAVENOUS

## 2015-12-14 MED ORDER — SODIUM CHLORIDE 0.9 % IJ SOLN
INTRAMUSCULAR | Status: DC | PRN
  Administered 2015-12-14: 30 mL

## 2015-12-14 MED ORDER — ONDANSETRON HCL 4 MG PO TABS: 4.0000 mg | ORAL_TABLET | Freq: Four times a day (QID) | ORAL | Status: DC | PRN

## 2015-12-14 MED ORDER — RIVAROXABAN 10 MG PO TABS
10.0000 mg | ORAL_TABLET | Freq: Every day | ORAL | Status: DC
  Administered 2015-12-15 – 2015-12-16 (×2): 10 mg via ORAL
  Filled 2015-12-14 (×2): qty 1

## 2015-12-14 MED ORDER — PHENYLEPHRINE HCL 10 MG/ML IJ SOLN
INTRAVENOUS | Status: DC | PRN
  Administered 2015-12-14: 10 ug/min via INTRAVENOUS

## 2015-12-14 MED ORDER — ACETAMINOPHEN 650 MG RE SUPP: 650.0000 mg | Freq: Four times a day (QID) | RECTAL | Status: DC | PRN

## 2015-12-14 MED ORDER — SIMVASTATIN 20 MG PO TABS
40.0000 mg | ORAL_TABLET | Freq: Every day | ORAL | Status: DC
  Administered 2015-12-14 – 2015-12-15 (×2): 40 mg via ORAL
  Filled 2015-12-14 (×2): qty 2

## 2015-12-14 MED ORDER — ONDANSETRON HCL 4 MG/2ML IJ SOLN: 4.0000 mg | Freq: Four times a day (QID) | INTRAMUSCULAR | Status: DC | PRN

## 2015-12-14 MED ORDER — ACETAMINOPHEN 10 MG/ML IV SOLN
INTRAVENOUS | Status: AC
  Filled 2015-12-14: qty 100

## 2015-12-14 MED ORDER — SODIUM CHLORIDE 0.9 % IV SOLN
INTRAVENOUS | Status: DC
  Administered 2015-12-14 – 2015-12-15 (×2): via INTRAVENOUS

## 2015-12-14 MED ORDER — MEPERIDINE HCL 50 MG/ML IJ SOLN: 6.2500 mg | INTRAMUSCULAR | Status: DC | PRN

## 2015-12-14 MED ORDER — BUPIVACAINE HCL 0.25 % IJ SOLN
INTRAMUSCULAR | Status: DC | PRN
  Administered 2015-12-14: 20 mL

## 2015-12-14 MED ORDER — BUPIVACAINE LIPOSOME 1.3 % IJ SUSP
20.0000 mL | Freq: Once | INTRAMUSCULAR | Status: DC
  Filled 2015-12-14: qty 20

## 2015-12-14 MED ORDER — LEVOTHYROXINE SODIUM 75 MCG PO TABS
75.0000 ug | ORAL_TABLET | Freq: Every day | ORAL | Status: DC
  Administered 2015-12-15 – 2015-12-16 (×2): 75 ug via ORAL
  Filled 2015-12-14 (×2): qty 1

## 2015-12-14 MED ORDER — SODIUM CHLORIDE 0.9 % IJ SOLN
INTRAMUSCULAR | Status: AC
  Filled 2015-12-14: qty 50

## 2015-12-14 MED ORDER — SODIUM CHLORIDE 0.9 % IR SOLN
Status: DC | PRN
  Administered 2015-12-14: 1000 mL

## 2015-12-14 MED ORDER — MENTHOL 3 MG MT LOZG: 1.0000 | LOZENGE | OROMUCOSAL | Status: DC | PRN

## 2015-12-14 MED ORDER — TRANEXAMIC ACID 1000 MG/10ML IV SOLN
1000.0000 mg | INTRAVENOUS | Status: AC
  Administered 2015-12-14: 1000 mg via INTRAVENOUS
  Filled 2015-12-14: qty 10

## 2015-12-14 SURGICAL SUPPLY — 47 items
BAG DECANTER FOR FLEXI CONT (MISCELLANEOUS) ×2 IMPLANT
BAG ZIPLOCK 12X15 (MISCELLANEOUS) ×2 IMPLANT
BANDAGE ACE 6X5 VEL STRL LF (GAUZE/BANDAGES/DRESSINGS) ×2 IMPLANT
BLADE SAG 18X100X1.27 (BLADE) ×2 IMPLANT
BLADE SAW SGTL 11.0X1.19X90.0M (BLADE) ×2 IMPLANT
BOWL SMART MIX CTS (DISPOSABLE) ×2 IMPLANT
CAP KNEE TOTAL 3 SIGMA ×2 IMPLANT
CEMENT HV SMART SET (Cement) ×4 IMPLANT
CLOTH BEACON ORANGE TIMEOUT ST (SAFETY) ×2 IMPLANT
CUFF TOURN SGL QUICK 34 (TOURNIQUET CUFF) ×1
CUFF TRNQT CYL 34X4X40X1 (TOURNIQUET CUFF) ×1 IMPLANT
DECANTER SPIKE VIAL GLASS SM (MISCELLANEOUS) ×2 IMPLANT
DRAPE U-SHAPE 47X51 STRL (DRAPES) ×2 IMPLANT
DRSG ADAPTIC 3X8 NADH LF (GAUZE/BANDAGES/DRESSINGS) ×2 IMPLANT
DURAPREP 26ML APPLICATOR (WOUND CARE) ×2 IMPLANT
ELECT REM PT RETURN 9FT ADLT (ELECTROSURGICAL) ×2
ELECTRODE REM PT RTRN 9FT ADLT (ELECTROSURGICAL) ×1 IMPLANT
EVACUATOR 1/8 PVC DRAIN (DRAIN) ×2 IMPLANT
GAUZE SPONGE 4X4 12PLY STRL (GAUZE/BANDAGES/DRESSINGS) ×2 IMPLANT
GLOVE BIO SURGEON STRL SZ7.5 (GLOVE) ×4 IMPLANT
GLOVE BIO SURGEON STRL SZ8 (GLOVE) ×2 IMPLANT
GLOVE BIOGEL PI IND STRL 7.5 (GLOVE) ×4 IMPLANT
GLOVE BIOGEL PI IND STRL 8 (GLOVE) ×1 IMPLANT
GLOVE BIOGEL PI INDICATOR 7.5 (GLOVE) ×4
GLOVE BIOGEL PI INDICATOR 8 (GLOVE) ×1
GLOVE SURG SS PI 7.5 STRL IVOR (GLOVE) ×2 IMPLANT
GOWN SPEC L3 XXLG W/TWL (GOWN DISPOSABLE) ×2 IMPLANT
GOWN STRL REUS W/TWL LRG LVL3 (GOWN DISPOSABLE) ×2 IMPLANT
GOWN STRL REUS W/TWL XL LVL3 (GOWN DISPOSABLE) ×4 IMPLANT
HANDPIECE INTERPULSE COAX TIP (DISPOSABLE) ×1
IMMOBILIZER KNEE 20 (SOFTGOODS) ×2
IMMOBILIZER KNEE 20 THIGH 36 (SOFTGOODS) ×1 IMPLANT
MANIFOLD NEPTUNE II (INSTRUMENTS) ×2 IMPLANT
PACK TOTAL KNEE CUSTOM (KITS) ×2 IMPLANT
PAD ABD 8X10 STRL (GAUZE/BANDAGES/DRESSINGS) ×2 IMPLANT
PADDING CAST COTTON 6X4 STRL (CAST SUPPLIES) ×6 IMPLANT
POSITIONER SURGICAL ARM (MISCELLANEOUS) ×2 IMPLANT
SET HNDPC FAN SPRY TIP SCT (DISPOSABLE) ×1 IMPLANT
STRIP CLOSURE SKIN 1/2X4 (GAUZE/BANDAGES/DRESSINGS) ×4 IMPLANT
SUT MNCRL AB 4-0 PS2 18 (SUTURE) ×2 IMPLANT
SUT VIC AB 2-0 CT1 27 (SUTURE) ×3
SUT VIC AB 2-0 CT1 TAPERPNT 27 (SUTURE) ×3 IMPLANT
SUT VLOC 180 0 24IN GS25 (SUTURE) ×2 IMPLANT
SYR 50ML LL SCALE MARK (SYRINGE) ×2 IMPLANT
TRAY FOLEY CATH SILVER 14FR (SET/KITS/TRAYS/PACK) ×2 IMPLANT
WRAP KNEE MAXI GEL POST OP (GAUZE/BANDAGES/DRESSINGS) ×2 IMPLANT
YANKAUER SUCT BULB TIP 10FT TU (MISCELLANEOUS) ×2 IMPLANT

## 2015-12-14 NOTE — Anesthesia Procedure Notes (Signed)
Spinal  Start time: 12/14/2015 10:14 AM End time: 12/14/2015 10:17 AM Staffing Anesthesiologist: Lillia Abed Resident/CRNA: Glory Buff Performed: resident/CRNA  Preanesthetic Checklist Completed: patient identified, site marked, surgical consent, pre-op evaluation, timeout performed, IV checked, risks and benefits discussed and monitors and equipment checked Spinal Block Patient position: sitting Prep: Betasept Patient monitoring: heart rate, continuous pulse ox and blood pressure Approach: midline Location: L3-4 Injection technique: single-shot Needle Needle type: Pencan  Needle gauge: 24 G Needle length: 10 cm Needle insertion depth: 5 cm Assessment Sensory level: T6 Additional Notes Kit expiration date checked, Sitting position, sterile prep and drape, 1% xylo local, 24g pencan x 1 stick @ L3-4, +Csf pre and post injection, patient tolerated well.

## 2015-12-14 NOTE — Op Note (Signed)
OPERATIVE REPORT-TOTAL KNEE ARTHROPLASTY   Pre-operative diagnosis- Osteoarthritis  Left knee(s)  Post-operative diagnosis- Osteoarthritis Left knee(s)  Procedure-  Left  Total Knee Arthroplasty  Surgeon- Dione Plover. Tobin Witucki, MD  Assistant- Arlee Muslim, PA-C   Anesthesia-  Spinal  EBL-* No blood loss amount entered *   Drains Hemovac  Tourniquet time-  Total Tourniquet Time Documented: Thigh (Left) - 27 minutes Total: Thigh (Left) - 27 minutes     Complications- None  Condition-PACU - hemodynamically stable.   Brief Clinical Note  Patricia Morton is a 82 y.o. year old female with end stage OA of her left knee with progressively worsening pain and dysfunction. She has constant pain, with activity and at rest and significant functional deficits with difficulties even with ADLs. She has had extensive non-op management including analgesics, injections of cortisone and viscosupplements, and home exercise program, but remains in significant pain with significant dysfunction. Radiographs show bone on bone arthritis medial and patellofemoral. She presents now for left Total Knee Arthroplasty.    Procedure in detail---   The patient is brought into the operating room and positioned supine on the operating table. After successful administration of  Spinal,   a tourniquet is placed high on the  Left thigh(s) and the lower extremity is prepped and draped in the usual sterile fashion. Time out is performed by the operating team and then the  Left lower extremity is wrapped in Esmarch, knee flexed and the tourniquet inflated to 300 mmHg.       A midline incision is made with a ten blade through the subcutaneous tissue to the level of the extensor mechanism. A fresh blade is used to make a medial parapatellar arthrotomy. Soft tissue over the proximal medial tibia is subperiosteally elevated to the joint line with a knife and into the semimembranosus bursa with a Cobb elevator. Soft tissue over the  proximal lateral tibia is elevated with attention being paid to avoiding the patellar tendon on the tibial tubercle. The patella is everted, knee flexed 90 degrees and the ACL and PCL are removed. Findings are bone on bone medial and patellofemoral with large global osteophytes.        The drill is used to create a starting hole in the distal femur and the canal is thoroughly irrigated with sterile saline to remove the fatty contents. The 5 degree Left  valgus alignment guide is placed into the femoral canal and the distal femoral cutting block is pinned to remove 10 mm off the distal femur. Resection is made with an oscillating saw.      The tibia is subluxed forward and the menisci are removed. The extramedullary alignment guide is placed referencing proximally at the medial aspect of the tibial tubercle and distally along the second metatarsal axis and tibial crest. The block is pinned to remove 84mm off the more deficient medial  side. Resection is made with an oscillating saw. Size 3is the most appropriate size for the tibia and the proximal tibia is prepared with the modular drill and keel punch for that size.      The femoral sizing guide is placed and size 2.5 is most appropriate. Rotation is marked off the epicondylar axis and confirmed by creating a rectangular flexion gap at 90 degrees. The size 2.5 cutting block is pinned in this rotation and the anterior, posterior and chamfer cuts are made with the oscillating saw. The intercondylar block is then placed and that cut is made.  Trial size 3 tibial component, trial size 2.5 posterior stabilized femur and a 12.5  mm posterior stabilized rotating platform insert trial is placed. Full extension is achieved with excellent varus/valgus and anterior/posterior balance throughout full range of motion. The patella is everted and thickness measured to be 22  mm. Free hand resection is taken to 12 mm, a 35 template is placed, lug holes are drilled, trial  patella is placed, and it tracks normally. Osteophytes are removed off the posterior femur with the trial in place. All trials are removed and the cut bone surfaces prepared with pulsatile lavage. Cement is mixed and once ready for implantation, the size 3 tibial implant, size  2.5 posterior stabilized femoral component, and the size 35 patella are cemented in place and the patella is held with the clamp. The trial insert is placed and the knee held in full extension. The Exparel (20 ml mixed with 30 ml saline) and .25% Bupivicaine, are injected into the extensor mechanism, posterior capsule, medial and lateral gutters and subcutaneous tissues.  All extruded cement is removed and once the cement is hard the permanent 12.5 mm posterior stabilized rotating platform insert is placed into the tibial tray.      The wound is copiously irrigated with saline solution and the extensor mechanism closed over a hemovac drain with #1 V-loc suture. The tourniquet is released for a total tourniquet time of 27  minutes. Flexion against gravity is 140 degrees and the patella tracks normally. Subcutaneous tissue is closed with 2.0 vicryl and subcuticular with running 4.0 Monocryl. The incision is cleaned and dried and steri-strips and a bulky sterile dressing are applied. The limb is placed into a knee immobilizer and the patient is awakened and transported to recovery in stable condition.      Please note that a surgical assistant was a medical necessity for this procedure in order to perform it in a safe and expeditious manner. Surgical assistant was necessary to retract the ligaments and vital neurovascular structures to prevent injury to them and also necessary for proper positioning of the limb to allow for anatomic placement of the prosthesis.   Dione Plover Donnald Tabar, MD    12/14/2015, 11:05 AM

## 2015-12-14 NOTE — Transfer of Care (Signed)
Immediate Anesthesia Transfer of Care Note  Patient: Patricia Morton  Procedure(s) Performed: Procedure(s): LEFT TOTAL KNEE ARTHROPLASTY (Left)  Patient Location: PACU  Anesthesia Type:Spinal  Level of Consciousness: awake, alert , oriented and patient cooperative  Airway & Oxygen Therapy: Patient Spontanous Breathing and Patient connected to face mask oxygen  Post-op Assessment: Report given to RN and Post -op Vital signs reviewed and stable  Post vital signs: Reviewed and stable  Last Vitals:  Vitals:   12/14/15 0739 12/14/15 1142  BP: (!) 124/59 (!) (P) 98/58  Pulse: 62 (!) (P) 58  Resp: 16 (P) 13  Temp: 36.8 C     Last Pain:  Vitals:   12/14/15 0739  TempSrc: Oral      Patients Stated Pain Goal: 3 ([id number])  Complications: No apparent anesthesia complications

## 2015-12-14 NOTE — Anesthesia Preprocedure Evaluation (Addendum)
Anesthesia Evaluation  Patient identified by MRN, date of birth, ID band Patient awake    Reviewed: Allergy & Precautions, NPO status , Patient's Chart, lab work & pertinent test results  Airway Mallampati: I  TM Distance: >3 FB Neck ROM: Full    Dental   Pulmonary    Pulmonary exam normal        Cardiovascular hypertension, Pt. on medications Normal cardiovascular exam     Neuro/Psych    GI/Hepatic   Endo/Other    Renal/GU      Musculoskeletal   Abdominal   Peds  Hematology   Anesthesia Other Findings   Reproductive/Obstetrics                             Anesthesia Physical Anesthesia Plan  ASA: II  Anesthesia Plan: Spinal   Post-op Pain Management:    Induction: Intravenous  Airway Management Planned: Simple Face Mask  Additional Equipment:   Intra-op Plan:   Post-operative Plan:   Informed Consent: I have reviewed the patients History and Physical, chart, labs and discussed the procedure including the risks, benefits and alternatives for the proposed anesthesia with the patient or authorized representative who has indicated his/her understanding and acceptance.     Plan Discussed with: CRNA and Surgeon  Anesthesia Plan Comments:         Anesthesia Quick Evaluation

## 2015-12-14 NOTE — Anesthesia Postprocedure Evaluation (Signed)
Anesthesia Post Note  Patient: Patricia Morton  Procedure(s) Performed: Procedure(s) (LRB): LEFT TOTAL KNEE ARTHROPLASTY (Left)  Patient location during evaluation: PACU Anesthesia Type: Spinal Level of consciousness: oriented and awake and alert Pain management: pain level controlled Vital Signs Assessment: post-procedure vital signs reviewed and stable Respiratory status: spontaneous breathing, respiratory function stable and patient connected to nasal cannula oxygen Cardiovascular status: blood pressure returned to baseline and stable Postop Assessment: no headache and no backache Anesthetic complications: no    Last Vitals:  Vitals:   12/14/15 1323 12/14/15 1330  BP: 127/72 127/72  Pulse: (!) 51 (!) 51  Resp: 16 16  Temp: 36.5 C 36.5 C    Last Pain:  Vitals:   12/14/15 1417  TempSrc:   PainSc: 5                  Celicia Minahan DAVID

## 2015-12-14 NOTE — H&P (View-Only) (Signed)
Patricia Morton DOB: 12-12-1926 Widowed / Language: Cleophus Molt / Race: White Female Date of Admission:  12/14/2015 CC: Left knee pain History of Present Illness  The patient is a 82 year old female who comes in for a preoperative History and Physical. The patient is scheduled for a left total knee arthroplasty to be performed by Dr. Dione Plover. Aluisio, MD at [facility] on 12/14/2015. The patient is a 82 year old female presenting several months out from right total knee arthroplasty. The patient states that she is doing very well except for her left knee OA causes pain; taking Tramadol) at this time. The pain is under excellent control at this time and describe their pain as no pain with her right knee. They are currently on no medication for their pain. The patient is currently doing home exercise program (walking). The patient feels that they are progressing well at this time. Note for "Post TKA": Pt. state's she's doing great. She feels that her right knee is doing great at this time. The only thing limiting her now is the arthritis in her left knee. She has end stage arthritis there. She has had junctions in the past which is no longer beneficial. She does want to go ahead and get the left knee replaced as she has been fantastic with regards to the right knee. They have been treated conservatively in the past for the above stated problem and despite conservative measures, they continue to have progressive pain and severe functional limitations and dysfunction. They have failed non-operative management including home exercise, medications. It is felt that they would benefit from undergoing total joint replacement. Risks and benefits of the procedure have been discussed with the patient and they elect to proceed with surgery. There are no active contraindications to surgery such as ongoing infection or rapidly progressive neurological disease.   Problem List/Past Medical Status post total right knee  replacement [id number])  Vertigo  Depression  High blood pressure  Hypercholesterolemia  Hypothyroidism  Osteoarthritis  Menopause  Primary localized osteoarthritis of right knee (M17.11)  Allergies No Known Drug Allergies   Family History Cancer  Maternal Grandmother, Mother. Congestive Heart Failure  Paternal Grandfather. Depression  Brother, Sister. Drug / Alcohol Addiction  Brother, Father, Paternal Grandfather. Heart Disease  Brother, Paternal 102, Sister. Hypertension  Sister. Liver Disease, Chronic  Father. Osteoarthritis  Brother, Sister.  Social History Children  2 Current drinker  02/25/2015: Currently drinks wine only occasionally per week Current work status  retired Exercise  Exercises rarely Living situation  live alone Marital status  widowed No history of drug/alcohol rehab  Not under pain contract  Number of flights of stairs before winded  2-3 Tobacco / smoke exposure  02/25/2015: no Tobacco use  Former smoker. 02/25/2015: smoke(d) less than 1/2 pack(s) per day [place] with Family Advance Directives  Living Will, Healthcare POA  Medication History Gabapentin (300MG  Capsule, Oral) Active. LORazepam (1MG  Tablet, Oral) Active. Metoprolol Tartrate (50MG  Tablet, Oral) Active. Triamterene-HCTZ (37.5-25MG  Tablet, Oral) Active. Levothyroxine Sodium (75MCG Tablet, Oral) Active. Simvastatin (40MG  Tablet, Oral) Active. TraMADol HCl (50MG  Tablet, 1-2 Tablet Oral every 6-8 hours as needed for pain, Taken starting 02/26/2015) Active.   Past Surgical History Breast Biopsy  Date: 50. right Total Knee Replacement - Right [05/25/2015]: Hysterectomy  Date: 31. complete (non-cancerous)   Review of Systems General Not Present- Chills, Fatigue, Fever, Memory Loss, Night Sweats, Weight Gain and Weight Loss. Skin Not Present- Eczema, Hives, Itching, Lesions and Rash. HEENT  Not Present- Dentures,  Double Vision, Headache, Hearing Loss, Tinnitus and Visual Loss. Respiratory Not Present- Allergies, Chronic Cough, Coughing up blood, Shortness of breath at rest and Shortness of breath with exertion. Cardiovascular Not Present- Chest Pain, Difficulty Breathing Lying Down, Murmur, Palpitations, Racing/skipping heartbeats and Swelling. Gastrointestinal Present- Loss of appetitie. Not Present- Abdominal Pain, Bloody Stool, Constipation, Diarrhea, Difficulty Swallowing, Heartburn, Jaundice, Nausea and Vomiting. Female Genitourinary Present- Urinating at Night. Not Present- Blood in Urine, Discharge, Flank Pain, Incontinence, Painful Urination, Urgency, Urinary frequency, Urinary Retention and Weak urinary stream. Musculoskeletal Present- Joint Pain and Joint Swelling. Not Present- Back Pain, Morning Stiffness, Muscle Pain, Muscle Weakness and Spasms. Neurological Not Present- Blackout spells, Difficulty with balance, Dizziness, Paralysis, Tremor and Weakness. Psychiatric Not Present- Insomnia.  Vitals Weight: 149 lb Height: 63in Weight was reported by patient. Height was reported by patient. Body Surface Area: 1.71 m Body Mass Index: 26.39 kg/m  Pulse: 68 (Regular)  BP: 106/52 (Sitting, Right Arm, Standard)   Physical Exam  General Mental Status -Alert, cooperative and good historian. General Appearance-pleasant, Not in acute distress. Orientation-Oriented X3. Build & Nutrition-Well nourished and Well developed.  Head and Neck Head-normocephalic, atraumatic . Neck Global Assessment - supple, no bruit auscultated on the right, no bruit auscultated on the left.  Eye Vision-Wears corrective lenses. Pupil - Bilateral-Regular and Round. Motion - Bilateral-EOMI.  ENMT Note: upper dentures   Chest and Lung Exam Auscultation Breath sounds - clear at anterior chest wall and clear at posterior chest wall. Adventitious sounds - No Adventitious  sounds.  Cardiovascular Auscultation Rhythm - Regular rate and rhythm. Heart Sounds - S1 WNL and S2 WNL. Murmurs & Other Heart Sounds - Auscultation of the heart reveals - No Murmurs.  Abdomen Palpation/Percussion Tenderness - Abdomen is non-tender to palpation. Rigidity (guarding) - Abdomen is soft. Auscultation Auscultation of the abdomen reveals - Bowel sounds normal.  Female Genitourinary Note: Not done, not pertinent to present illness   Musculoskeletal Note: She is in no distress. Her right knee shows no swelling, range 0 to 130 with no tenderness or instability. Left knee, varus deformity, range 5 to 115. Marked crepitus on range of motion. Tender medial greater than lateral with no instability noted.   Assessment & Plan Primary osteoarthritis of left knee (M17.12) Status post total right knee replacement [id number])  Note:Surgical Plans: Left Total Knee Replacement  Disposition: Home  PCP: Dr. Matthias Hughs  IV TXA  Anesthesia Issues: None  Signed electronically by Ok Edwards, III PA-C

## 2015-12-14 NOTE — Evaluation (Signed)
Physical Therapy Evaluation Patient Details Name: Patricia Morton MRN: [id number] DOB: [birth year] Today's Date: 12/14/2015   History of Present Illness  s/p L TKA; PMHx: R TKA 05/2015  Clinical Impression  Pt is s/p TKA resulting in the deficits listed below (see PT Problem List).  Pt will benefit from skilled PT to increase their independence and safety with mobility to allow discharge to the venue listed below.      Follow Up Recommendations Outpatient PT;Home health PT    Equipment Recommendations  None recommended by PT    Recommendations for Other Services       Precautions / Restrictions Precautions Precautions: Knee Required Braces or Orthoses: Knee Immobilizer - Left Restrictions Weight Bearing Restrictions: No Other Position/Activity Restrictions: WBAT      Mobility  Bed Mobility Overal bed mobility: Needs Assistance Bed Mobility: Supine to Sit     Supine to sit: Min assist     General bed mobility comments: min with LLE  Transfers Overall transfer level: Needs assistance Equipment used: Rolling walker (2 wheeled) Transfers: Sit to/from Stand Sit to Stand: Min assist         General transfer comment: cues for hand placement adn LLE position  Ambulation/Gait Ambulation/Gait assistance: Min assist Ambulation Distance (Feet): 36 Feet Assistive device: Rolling walker (2 wheeled) Gait Pattern/deviations: Step-to pattern;Decreased step length - right;Decreased step length - left;Decreased weight shift to left Gait velocity: decr   General Gait Details: cues for posture, RW position, seqeuence and step length  Stairs            Wheelchair Mobility    Modified Rankin (Stroke Patients Only)       Balance                                             Pertinent Vitals/Pain Pain Assessment: 0-10 Pain Score: 6  Pain Location: L knee Pain Descriptors / Indicators: Aching;Sore Pain Intervention(s): Limited activity within  patient's tolerance;Monitored during session;Premedicated before session;Ice applied    Home Living Family/patient expects to be discharged to:: Private residence Living Arrangements: Alone Available Help at Discharge: Family Type of Home: House Home Access: [place]: Able to live on main level with bedroom/bathroom Home Equipment: [place] - 2 wheels;Cane - single point;Bedside commode Additional Comments: going to daughter's home as above    Prior Function Level of Independence: Independent               Hand Dominance        Extremity/Trunk Assessment   Upper Extremity Assessment: Overall WFL for tasks assessed;Defer to OT evaluation           Lower Extremity Assessment: LLE deficits/detail RLE Deficits / Details: ankle WFL; able to assist with SLR, anticpated post op weakness       Communication   Communication: HOH  Cognition Arousal/Alertness: Awake/alert Behavior During Therapy: WFL for tasks assessed/performed Overall Cognitive Status: Within Functional Limits for tasks assessed                      General Comments      Exercises Total Joint Exercises Ankle Circles/Pumps: AROM;Both;10 reps Quad Sets: AROM;Both;10 reps   Assessment/Plan    PT Assessment Patient needs continued PT services  PT Problem List Decreased strength;Decreased range of motion;Decreased mobility;Decreased knowledge of use of DME;Pain  PT Treatment Interventions DME instruction;Gait training;Functional mobility training;Therapeutic exercise;Therapeutic activities;Patient/family education    PT Goals (Current goals can be found in the Care Plan section)  Acute Rehab PT Goals Patient Stated Goal: less pain PT Goal Formulation: With patient Time For Goal Achievement: 12/18/15 Potential to Achieve Goals: Good    Frequency 7X/week   Barriers to discharge        Co-evaluation               End of Session Equipment  Utilized During Treatment: Gait belt;Left knee immobilizer Activity Tolerance: Patient tolerated treatment well Patient left: in chair;with call bell/phone within reach;with chair alarm set;with family/visitor present           Time: [id number] PT Time Calculation (min) (ACUTE ONLY): 23 min   Charges:   PT Evaluation $PT Eval Low Complexity: 1 Procedure PT Treatments $Gait Training: 8-22 mins   PT G Codes:        Jourdin Connors January 10, 2016, 5:31 PM

## 2015-12-14 NOTE — Interval H&P Note (Signed)
History and Physical Interval Note:  12/14/2015 9:14 AM  Patricia Morton  has presented today for surgery, with the diagnosis of LEFT  KNEE OA  The various methods of treatment have been discussed with the patient and family. After consideration of risks, benefits and other options for treatment, the patient has consented to  Procedure(s): LEFT TOTAL KNEE ARTHROPLASTY (Left) as a surgical intervention .  The patient's history has been reviewed, patient examined, no change in status, stable for surgery.  I have reviewed the patient's chart and labs.  Questions were answered to the patient's satisfaction.     Gearlean Alf

## 2015-12-15 LAB — CBC
HEMATOCRIT: 33 % — AB (ref 36.0–46.0)
HEMOGLOBIN: 10.8 g/dL — AB (ref 12.0–15.0)
MCH: 29.8 pg (ref 26.0–34.0)
MCHC: 32.7 g/dL (ref 30.0–36.0)
MCV: 90.9 fL (ref 78.0–100.0)
Platelets: 306 10*3/uL (ref 150–400)
RBC: 3.63 MIL/uL — AB (ref 3.87–5.11)
RDW: 14.9 % (ref 11.5–15.5)
WBC: 10.9 10*3/uL — ABNORMAL HIGH (ref 4.0–10.5)

## 2015-12-15 LAB — BASIC METABOLIC PANEL
Anion gap: 6 (ref 5–15)
BUN: 11 mg/dL (ref 6–20)
CALCIUM: 8.4 mg/dL — AB (ref 8.9–10.3)
CO2: 25 mmol/L (ref 22–32)
Chloride: 107 mmol/L (ref 101–111)
Creatinine, Ser: 0.81 mg/dL (ref 0.44–1.00)
Glucose, Bld: 148 mg/dL — ABNORMAL HIGH (ref 65–99)
POTASSIUM: 4 mmol/L (ref 3.5–5.1)
Sodium: 138 mmol/L (ref 135–145)

## 2015-12-15 MED ORDER — OXYCODONE HCL 5 MG PO TABS: 5.0000 mg | ORAL_TABLET | ORAL | 0 refills | Status: DC | PRN

## 2015-12-15 MED ORDER — TRAMADOL HCL 50 MG PO TABS: 50.0000 mg | ORAL_TABLET | Freq: Four times a day (QID) | ORAL | 1 refills | Status: DC | PRN

## 2015-12-15 MED ORDER — METHOCARBAMOL 500 MG PO TABS: 500.0000 mg | ORAL_TABLET | Freq: Four times a day (QID) | ORAL | 0 refills | Status: DC | PRN

## 2015-12-15 MED ORDER — ONDANSETRON HCL 4 MG PO TABS: 4.0000 mg | ORAL_TABLET | Freq: Four times a day (QID) | ORAL | 0 refills | Status: DC | PRN

## 2015-12-15 MED ORDER — RIVAROXABAN 10 MG PO TABS: 10.0000 mg | ORAL_TABLET | Freq: Every day | ORAL | 0 refills | Status: DC

## 2015-12-15 NOTE — Discharge Summary (Signed)
Physician Discharge Summary   Patient ID: Patricia Morton MRN: [id number] DOB/AGE: 07/16/1926 82 y.o.  Admit date: 12/14/2015 Discharge date: 12/16/2015   Primary Diagnosis:  Osteoarthritis  Left knee(s)  Admission Diagnoses:  Past Medical History:  Diagnosis Date  . Arthritis   . Hyperlipemia   . Hypertension   . Hypothyroid    Discharge Diagnoses:   Active Problems:   OA (osteoarthritis) of knee  Estimated body mass index is 25.69 kg/m as calculated from the following:   Height as of this encounter: 5' 3" (1.6 m).   Weight as of this encounter: 65.8 kg (145 lb).  Procedure:  Procedure(s) (LRB): LEFT TOTAL KNEE ARTHROPLASTY (Left)   Consults: None  HPI: Patricia Morton is a 82 y.o. year old female with end stage OA of her left knee with progressively worsening pain and dysfunction. She has constant pain, with activity and at rest and significant functional deficits with difficulties even with ADLs. She has had extensive non-op management including analgesics, injections of cortisone and viscosupplements, and home exercise program, but remains in significant pain with significant dysfunction. Radiographs show bone on bone arthritis medial and patellofemoral. She presents now for left Total Knee Arthroplasty.    Laboratory Data: Admission on 12/14/2015  Component Date Value Ref Range Status  . WBC 12/15/2015 10.9* 4.0 - 10.5 K/uL Final  . RBC 12/15/2015 3.63* 3.87 - 5.11 MIL/uL Final  . Hemoglobin 12/15/2015 10.8* 12.0 - 15.0 g/dL Final  . HCT 12/15/2015 33.0* 36.0 - 46.0 % Final  . MCV 12/15/2015 90.9  78.0 - 100.0 fL Final  . MCH 12/15/2015 29.8  26.0 - 34.0 pg Final  . MCHC 12/15/2015 32.7  30.0 - 36.0 g/dL Final  . RDW 12/15/2015 14.9  11.5 - 15.5 % Final  . Platelets 12/15/2015 306  150 - 400 K/uL Final  . Sodium 12/15/2015 138  135 - 145 mmol/L Final  . Potassium 12/15/2015 4.0  3.5 - 5.1 mmol/L Final  . Chloride 12/15/2015 107  101 - 111 mmol/L Final  . CO2 12/15/2015  25  22 - 32 mmol/L Final  . Glucose, Bld 12/15/2015 148* 65 - 99 mg/dL Final  . BUN 12/15/2015 11  6 - 20 mg/dL Final  . Creatinine, Ser 12/15/2015 0.81  0.44 - 1.00 mg/dL Final  . Calcium 12/15/2015 8.4* 8.9 - 10.3 mg/dL Final  . GFR calc non Af Amer 12/15/2015 >60  >60 mL/min Final  . GFR calc Af Amer 12/15/2015 >60  >60 mL/min Final   Comment: (NOTE) The eGFR has been calculated using the CKD EPI equation. This calculation has not been validated in all clinical situations. eGFR's persistently <60 mL/min signify possible Chronic Kidney Disease.   . Anion gap 12/15/2015 6  5 - 15 Final  Hospital Outpatient Visit on 12/04/2015  Component Date Value Ref Range Status  . aPTT 12/04/2015 30  24 - 36 seconds Final  . WBC 12/04/2015 6.3  4.0 - 10.5 K/uL Final  . RBC 12/04/2015 4.53  3.87 - 5.11 MIL/uL Final  . Hemoglobin 12/04/2015 13.2  12.0 - 15.0 g/dL Final  . HCT 12/04/2015 40.9  36.0 - 46.0 % Final  . MCV 12/04/2015 90.3  78.0 - 100.0 fL Final  . MCH 12/04/2015 29.1  26.0 - 34.0 pg Final  . MCHC 12/04/2015 32.3  30.0 - 36.0 g/dL Final  . RDW 12/04/2015 15.0  11.5 - 15.5 % Final  . Platelets 12/04/2015 363  150 - 400 K/uL Final  .   Sodium 12/04/2015 138  135 - 145 mmol/L Final  . Potassium 12/04/2015 4.2  3.5 - 5.1 mmol/L Final  . Chloride 12/04/2015 101  101 - 111 mmol/L Final  . CO2 12/04/2015 28  22 - 32 mmol/L Final  . Glucose, Bld 12/04/2015 104* 65 - 99 mg/dL Final  . BUN 12/04/2015 12  6 - 20 mg/dL Final  . Creatinine, Ser 12/04/2015 0.79  0.44 - 1.00 mg/dL Final  . Calcium 12/04/2015 9.1  8.9 - 10.3 mg/dL Final  . Total Protein 12/04/2015 6.9  6.5 - 8.1 g/dL Final  . Albumin 12/04/2015 3.9  3.5 - 5.0 g/dL Final  . AST 12/04/2015 20  15 - 41 U/L Final  . ALT 12/04/2015 13* 14 - 54 U/L Final  . Alkaline Phosphatase 12/04/2015 61  38 - 126 U/L Final  . Total Bilirubin 12/04/2015 0.4  0.3 - 1.2 mg/dL Final  . GFR calc non Af Amer 12/04/2015 >60  >60 mL/min Final  . GFR calc  Af Amer 12/04/2015 >60  >60 mL/min Final   Comment: (NOTE) The eGFR has been calculated using the CKD EPI equation. This calculation has not been validated in all clinical situations. eGFR's persistently <60 mL/min signify possible Chronic Kidney Disease.   . Anion gap 12/04/2015 9  5 - 15 Final  . Prothrombin Time 12/04/2015 13.2  11.4 - 15.2 seconds Final  . INR 12/04/2015 1.00   Final  . ABO/RH(D) 12/14/2015 A POS   Final  . Antibody Screen 12/14/2015 NEG   Final  . Sample Expiration 12/14/2015 12/17/2015   Final  . Extend sample reason 12/14/2015 NO TRANSFUSIONS OR PREGNANCY IN THE PAST 3 MONTHS   Final  . Color, Urine 12/04/2015 YELLOW  YELLOW Final  . APPearance 12/04/2015 CLEAR  CLEAR Final  . Specific Gravity, Urine 12/04/2015 1.008  1.005 - 1.030 Final  . pH 12/04/2015 7.0  5.0 - 8.0 Final  . Glucose, UA 12/04/2015 NEGATIVE  NEGATIVE mg/dL Final  . Hgb urine dipstick 12/04/2015 NEGATIVE  NEGATIVE Final  . Bilirubin Urine 12/04/2015 NEGATIVE  NEGATIVE Final  . Ketones, ur 12/04/2015 NEGATIVE  NEGATIVE mg/dL Final  . Protein, ur 12/04/2015 NEGATIVE  NEGATIVE mg/dL Final  . Nitrite 12/04/2015 NEGATIVE  NEGATIVE Final  . Leukocytes, UA 12/04/2015 SMALL* NEGATIVE Final  . MRSA, PCR 12/04/2015 NEGATIVE  NEGATIVE Final  . Staphylococcus aureus 12/04/2015 NEGATIVE  NEGATIVE Final   Comment:        The Xpert SA Assay (FDA approved for NASAL specimens in patients over 44 years of age), is one component of a comprehensive surveillance program.  Test performance has been validated by [facility] for patients greater than or equal to 71 year old. It is not intended to diagnose infection nor to guide or monitor treatment.   . Squamous Epithelial / LPF 12/04/2015 0-5* NONE SEEN Final  . WBC, UA 12/04/2015 0-5  0 - 5 WBC/hpf Final  . RBC / HPF 12/04/2015 NONE SEEN  0 - 5 RBC/hpf Final  . Bacteria, UA 12/04/2015 RARE* NONE SEEN Final     X-Rays:No results found.  EKG: Orders  placed or performed during the hospital encounter of 05/22/15  . EKG 12 lead  . EKG 12 lead     Hospital Course: [name] is a 82 y.o. who was admitted to [facility]. They were brought to the operating room on 12/14/2015 and underwent Procedure(s): LEFT TOTAL KNEE ARTHROPLASTY.  Patient tolerated the procedure well and was  later transferred to the recovery room and then to the orthopaedic floor for postoperative care.  They were given PO and IV analgesics for pain control following their surgery.  They were given 24 hours of postoperative antibiotics of  Anti-infectives    Start     Dose/Rate Route Frequency Ordered Stop   12/14/15 1600  ceFAZolin (ANCEF) IVPB 2g/100 mL premix     2 g 200 mL/hr over 30 Minutes Intravenous Every 6 hours 12/14/15 1347 12/14/15 2131   12/14/15 0747  ceFAZolin (ANCEF) IVPB 2g/100 mL premix     2 g 200 mL/hr over 30 Minutes Intravenous On call to O.R. 12/14/15 0748 12/14/15 1019     and started on DVT prophylaxis in the form of Xarelto.   PT and OT were ordered for total joint protocol.  Discharge planning consulted to help with postop disposition and equipment needs.  Patient had a decent night on the evening of surgery with pain.  They started to get up OOB with therapy on day one. Hemovac drain was pulled without difficulty.  Continued to work with therapy into day two.  Dressing was changed on day two and the incision was healing well. Patient was seen in rounds on POD 2 and was ready to go home.  Discharge home with home health Diet - Cardiac diet Follow up - in 2 weeks Activity - WBAT Disposition - Home Condition Upon Discharge - Stable D/C Meds - See DC Summary DVT Prophylaxis - Xarelto  Discharge Instructions    Call MD / Call 911    Complete by:  As directed    If you experience chest pain or shortness of breath, CALL 911 and be transported to the hospital emergency room.  If you develope a fever above 101 F, pus (white drainage) or  increased drainage or redness at the wound, or calf pain, call your surgeon's office.   Change dressing    Complete by:  As directed    Change dressing daily with sterile 4 x 4 inch gauze dressing and apply TED hose. Do not submerge the incision under water.   Constipation Prevention    Complete by:  As directed    Drink plenty of fluids.  Prune juice may be helpful.  You may use a stool softener, such as Colace (over the counter) 100 mg twice a day.  Use MiraLax (over the counter) for constipation as needed.   Diet - low sodium heart healthy    Complete by:  As directed    Discharge instructions    Complete by:  As directed    Pick up stool softner and laxative for home use following surgery while on pain medications. Do not submerge incision under water. Please use good hand washing techniques while changing dressing each day. May shower starting three days after surgery. Please use a clean towel to pat the incision dry following showers. Continue to use ice for pain and swelling after surgery. Do not use any lotions or creams on the incision until instructed by your surgeon.   Postoperative Constipation Protocol  Constipation - defined medically as fewer than three stools per week and severe constipation as less than one stool per week.  One of the most common issues patients have following surgery is constipation.  Even if you have a regular bowel pattern at home, your normal regimen is likely to be disrupted due to multiple reasons following surgery.  Combination of anesthesia, postoperative narcotics, change in appetite and fluid intake   all can affect your bowels.  In order to avoid complications following surgery, here are some recommendations in order to help you during your recovery period.  Colace (docusate) - Pick up an over-the-counter form of Colace or another stool softener and take twice a day as long as you are requiring postoperative pain medications.  Take with a full  glass of water daily.  If you experience loose stools or diarrhea, hold the colace until you stool forms back up.  If your symptoms do not get better within 1 week or if they get worse, check with your doctor.  Dulcolax (bisacodyl) - Pick up over-the-counter and take as directed by the product packaging as needed to assist with the movement of your bowels.  Take with a full glass of water.  Use this product as needed if not relieved by Colace only.   MiraLax (polyethylene glycol) - Pick up over-the-counter to have on hand.  MiraLax is a solution that will increase the amount of water in your bowels to assist with bowel movements.  Take as directed and can mix with a glass of water, juice, soda, coffee, or tea.  Take if you go more than two days without a movement. Do not use MiraLax more than once per day. Call your doctor if you are still constipated or irregular after using this medication for 7 days in a row.  If you continue to have problems with postoperative constipation, please contact the office for further assistance and recommendations.  If you experience "the worst abdominal pain ever" or develop nausea or vomiting, please contact the office immediatly for further recommendations for treatment.   Take Xarelto for two and a half more weeks, then discontinue Xarelto. Once the patient has completed the blood thinner regimen, then take a Baby 81 mg Aspirin daily for three more weeks.   Do not put a pillow under the knee. Place it under the heel.    Complete by:  As directed    Do not sit on low chairs, stoools or toilet seats, as it may be difficult to get up from low surfaces    Complete by:  As directed    Driving restrictions    Complete by:  As directed    No driving until released by the physician.   Increase activity slowly as tolerated    Complete by:  As directed    Lifting restrictions    Complete by:  As directed    No lifting until released by the physician.   Patient may  shower    Complete by:  As directed    You may shower without a dressing once there is no drainage.  Do not wash over the wound.  If drainage remains, do not shower until drainage stops.   TED hose    Complete by:  As directed    Use stockings (TED hose) for 3 weeks on both leg(s).  You may remove them at night for sleeping.   Weight bearing as tolerated    Complete by:  As directed    Laterality:  left   Extremity:  Lower       Medication List    STOP taking these medications   multivitamin with minerals Tabs tablet     TAKE these medications   gabapentin 300 MG capsule Commonly known as:  NEURONTIN Take 300-600 mg by mouth daily as needed (pain).   levothyroxine 75 MCG tablet Commonly known as:  SYNTHROID, LEVOTHROID Take 75 mcg  by mouth every morning.   LORazepam 1 MG tablet Commonly known as:  ATIVAN Take 1 mg by mouth at bedtime.   methocarbamol 500 MG tablet Commonly known as:  ROBAXIN Take 1 tablet (500 mg total) by mouth every 6 (six) hours as needed for muscle spasms.   metoprolol 50 MG tablet Commonly known as:  LOPRESSOR Take 50 mg by mouth 2 (two) times daily.   ondansetron 4 MG tablet Commonly known as:  ZOFRAN Take 1 tablet (4 mg total) by mouth every 6 (six) hours as needed for nausea.   oxyCODONE 5 MG immediate release tablet Commonly known as:  Oxy IR/ROXICODONE Take 1-2 tablets (5-10 mg total) by mouth every 3 (three) hours as needed for moderate pain or severe pain.   rivaroxaban 10 MG Tabs tablet Commonly known as:  XARELTO Take 1 tablet (10 mg total) by mouth daily with breakfast. Take Xarelto for two and a half more weeks, then discontinue Xarelto. Once the patient has completed the blood thinner regimen, then take a Baby 81 mg Aspirin daily for three more weeks. Start taking on:  12/16/2015   simvastatin 40 MG tablet Commonly known as:  ZOCOR Take 40 mg by mouth at bedtime.   traMADol 50 MG tablet Commonly known as:  ULTRAM Take 1  tablet (50 mg total) by mouth every 6 (six) hours as needed for moderate pain. What changed:  how much to take  reasons to take this   triamterene-hydrochlorothiazide 37.5-25 MG tablet Commonly known as:  MAXZIDE-25 Take 0.5 tablets by mouth daily.      Follow-up Information    ALUISIO,FRANK V, MD. Schedule an appointment as soon as possible for a visit on 12/29/2015.   Specialty:  Orthopedic Surgery Contact information: [address] [place] [address] [phone]           Signed: Drew Perkins, PA-C Orthopaedic Surgery 12/15/2015, 10:19 PM     

## 2015-12-15 NOTE — Progress Notes (Signed)
   Subjective: 1 Day Post-Op Procedure(s) (LRB): LEFT TOTAL KNEE ARTHROPLASTY (Left) Patient reports pain as mild.   Patient seen in rounds by Dr. Wynelle Link. Patient is well, but has had some minor complaints of pain in the knee, requiring pain medications We will start therapy today.  Plan is to go Home after hospital stay.  Objective: Vital signs in last 24 hours: Temp:  [97.5 F (36.4 C)-98.4 F (36.9 C)] 97.5 F (36.4 C) (10/03 2138) Pulse Rate:  [60-102] 102 (10/03 2138) Resp:  [16-18] 16 (10/03 2138) BP: (112-168)/(52-77) 168/77 (10/03 2138) SpO2:  [95 %-99 %] 99 % (10/03 2138)  Intake/Output from previous day:  Intake/Output Summary (Last 24 hours) at 12/15/15 2220 Last data filed at 12/15/15 1902  Gross per 24 hour  Intake          1731.25 ml  Output             3325 ml  Net         -1593.75 ml    Intake/Output this shift: Total I/O In: 240 [P.O.:240] Out: 400 [Urine:400]  Labs:  Recent Labs  12/15/15 0422  HGB 10.8*    Recent Labs  12/15/15 0422  WBC 10.9*  RBC 3.63*  HCT 33.0*  PLT 306    Recent Labs  12/15/15 0422  NA 138  K 4.0  CL 107  CO2 25  BUN 11  CREATININE 0.81  GLUCOSE 148*  CALCIUM 8.4*   No results for input(s): LABPT, INR in the last 72 hours.  EXAM General - Patient is Alert, Appropriate and Oriented Extremity - Neurovascular intact Sensation intact distally Dorsiflexion/Plantar flexion intact Dressing - dressing C/D/I Motor Function - intact, moving foot and toes well on exam.  Hemovac pulled without difficulty.  Past Medical History:  Diagnosis Date  . Arthritis   . Hyperlipemia   . Hypertension   . Hypothyroid     Assessment/Plan: 1 Day Post-Op Procedure(s) (LRB): LEFT TOTAL KNEE ARTHROPLASTY (Left) Active Problems:   OA (osteoarthritis) of knee  Estimated body mass index is 25.69 kg/m as calculated from the following:   Height as of this encounter: 5\' 3"  (1.6 m).   Weight as of this encounter: 65.8  kg (145 lb). Advance diet Up with therapy Plan for discharge tomorrow Discharge home with home health  DVT Prophylaxis - Xarelto.  Had difficulty getting filled last time so will give a RX for the Xarelto to the family and allow them to take totthe pharmacy to help with filling. Weight-Bearing as tolerated to left leg D/C O2 and Pulse OX and try on Room Air  Arlee Muslim, PA-C Orthopaedic Surgery 12/15/2015, 10:20 PM

## 2015-12-15 NOTE — Progress Notes (Signed)
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    PT Comments    Pt progressing well  Follow Up Recommendations  Outpatient PT;Home health PT     Equipment Recommendations  None recommended by PT    Recommendations for Other Services       Precautions / Restrictions Precautions Precautions: Knee Required Braces or Orthoses: Knee Immobilizer - Left Knee Immobilizer - Left: Discontinue once straight leg raise with < 10 degree lag Restrictions Weight Bearing Restrictions: No Other Position/Activity Restrictions: WBAT    Mobility  Bed Mobility               General bed mobility comments:  (OOB)  Transfers Overall transfer level: Needs assistance Equipment used: Rolling walker (2 wheeled) Transfers: Sit to/from Stand Sit to Stand: Min guard         General transfer comment: cues for hand placement and LLE position  Ambulation/Gait Ambulation/Gait assistance: Min guard Ambulation Distance (Feet): 65 Feet Assistive device: Rolling walker (2 wheeled) Gait Pattern/deviations: Step-to pattern;Step-through pattern;Decreased stride length     General Gait Details: cues for safety and RW position withturns, cues for gait progression/step-through   Stairs            Wheelchair Mobility    Modified Rankin (Stroke Patients Only)       Balance                                    Cognition Arousal/Alertness: Awake/alert Behavior During Therapy: WFL for tasks assessed/performed Overall Cognitive Status: Within Functional Limits for tasks assessed                      Exercises Total Joint Exercises Ankle Circles/Pumps: AROM;Both;10 reps Quad Sets: AROM;Both;10 reps Heel Slides: Left;AAROM;10 reps Hip ABduction/ADduction: AROM;Left;10 reps Straight Leg Raises: AAROM;AROM;Left;10 reps     General Comments        Pertinent Vitals/Pain Pain Assessment: 0-10 Pain Score: 4  Pain Location: L knee Pain Descriptors / Indicators: Discomfort;Grimacing;Guarding Pain Intervention(s): Limited activity within patient's tolerance;Monitored during session;Premedicated before session;Ice applied    Home Living                      Prior Function            PT Goals (current goals can now be found in the care plan section) Acute Rehab PT Goals Patient Stated Goal: less pain PT Goal Formulation: With patient Time For Goal Achievement: 12/18/15 Potential to Achieve Goals: Good Progress towards PT goals: Progressing toward goals    Frequency    7X/week      PT Plan Current plan remains appropriate    Co-evaluation             End of Session Equipment Utilized During Treatment: Gait belt;Left knee immobilizer Activity Tolerance: Patient tolerated treatment well Patient left: in chair;with call bell/phone within reach;with chair alarm set;with family/visitor present     Time: 1000-1018 PT Time Calculation (min) (ACUTE ONLY): 18 min  Charges:  $Gait Training: 8-22 mins                    G Codes:      Lorrie Strauch 2016/01/02, 11:05 AM

## 2015-12-15 NOTE — Progress Notes (Signed)
OT Cancellation Note  Patient Details Name: ELPHA RYON MRN: [id number] DOB: 06/11/1926   Cancelled Treatment:    Reason Eval/Treat Not Completed: OT screened, no needs identified, will sign off - Spoke to pt. She reports she recalls all ADL techniques from prior TKA in March 2017. Pt has all needed DME. No OT needs identified; will sign off.  Chaseton Yepiz A 12/15/2015, 10:24 AM

## 2015-12-15 NOTE — Discharge Instructions (Signed)
° °Dr. Frank Aluisio °Total Joint Specialist °[facility] °[address] °[place], [place] [address] °[phone] ° °TOTAL KNEE REPLACEMENT POSTOPERATIVE DIRECTIONS ° °Knee Rehabilitation, Guidelines Following Surgery  °Results after knee surgery are often greatly improved when you follow the exercise, range of motion and muscle strengthening exercises prescribed by your doctor. Safety measures are also important to protect the knee from further injury. Any time any of these exercises cause you to have increased pain or swelling in your knee joint, decrease the amount until you are comfortable again and slowly increase them. If you have problems or questions, call your caregiver or physical therapist for advice.  ° °HOME CARE INSTRUCTIONS  °Remove items at home which could result in a fall. This includes throw rugs or furniture in walking pathways.  °· ICE to the affected knee every three hours for 30 minutes at a time and then as needed for pain and swelling.  Continue to use ice on the knee for pain and swelling from surgery. You may notice swelling that will progress down to the foot and ankle.  This is normal after surgery.  Elevate the leg when you are not up walking on it.   °· Continue to use the breathing machine which will help keep your temperature down.  It is common for your temperature to cycle up and down following surgery, especially at night when you are not up moving around and exerting yourself.  The breathing machine keeps your lungs expanded and your temperature down. °· Do not place pillow under knee, focus on keeping the knee straight while resting ° °DIET °You may resume your previous home diet once your are discharged from the hospital. ° °DRESSING / WOUND CARE / SHOWERING °You may shower 3 days after surgery, but keep the wounds dry during showering.  You may use an occlusive plastic wrap (Press'n Seal for example), NO SOAKING/SUBMERGING IN THE BATHTUB.  If the  bandage gets wet, change with a clean dry gauze.  If the incision gets wet, pat the wound dry with a clean towel. °You may start showering once you are discharged home but do not submerge the incision under water. Just pat the incision dry and apply a dry gauze dressing on daily. °Change the surgical dressing daily and reapply a dry dressing each time. ° °ACTIVITY °Walk with your walker as instructed. °Use walker as long as suggested by your caregivers. °Avoid periods of inactivity such as sitting longer than an hour when not asleep. This helps prevent blood clots.  °You may resume a sexual relationship in one month or when given the OK by your doctor.  °You may return to work once you are cleared by your doctor.  °Do not drive a car for 6 weeks or until released by you surgeon.  °Do not drive while taking narcotics. ° °WEIGHT BEARING °Weight bearing as tolerated with assist device (walker, cane, etc) as directed, use it as long as suggested by your surgeon or therapist, typically at least 4-6 weeks. ° °POSTOPERATIVE CONSTIPATION PROTOCOL °Constipation - defined medically as fewer than three stools per week and severe constipation as less than one stool per week. ° °One of the most common issues patients have following surgery is constipation.  Even if you have a regular bowel pattern at home, your normal regimen is likely to be disrupted due to multiple reasons following surgery.  Combination of anesthesia, postoperative narcotics, change in appetite and fluid intake all can affect your bowels.    In order to avoid complications following surgery, here are some recommendations in order to help you during your recovery period. ° °Colace (docusate) - Pick up an over-the-counter form of Colace or another stool softener and take twice a day as long as you are requiring postoperative pain medications.  Take with a full glass of water daily.  If you experience loose stools or diarrhea, hold the colace until you stool forms  back up.  If your symptoms do not get better within 1 week or if they get worse, check with your doctor. ° °Dulcolax (bisacodyl) - Pick up over-the-counter and take as directed by the product packaging as needed to assist with the movement of your bowels.  Take with a full glass of water.  Use this product as needed if not relieved by Colace only.  ° °MiraLax (polyethylene glycol) - Pick up over-the-counter to have on hand.  MiraLax is a solution that will increase the amount of water in your bowels to assist with bowel movements.  Take as directed and can mix with a glass of water, juice, soda, coffee, or tea.  Take if you go more than two days without a movement. °Do not use MiraLax more than once per day. Call your doctor if you are still constipated or irregular after using this medication for 7 days in a row. ° °If you continue to have problems with postoperative constipation, please contact the office for further assistance and recommendations.  If you experience "the worst abdominal pain ever" or develop nausea or vomiting, please contact the office immediatly for further recommendations for treatment. ° °ITCHING ° If you experience itching with your medications, try taking only a single pain pill, or even half a pain pill at a time.  You can also use Benadryl over the counter for itching or also to help with sleep.  ° °TED HOSE STOCKINGS °Wear the elastic stockings on both legs for three weeks following surgery during the day but you may remove then at night for sleeping. ° °MEDICATIONS °See your medication summary on the “After Visit Summary” that the nursing staff will review with you prior to discharge.  You may have some home medications which will be placed on hold until you complete the course of blood thinner medication.  It is important for you to complete the blood thinner medication as prescribed by your surgeon.  Continue your approved medications as instructed at time of  discharge. ° °PRECAUTIONS °If you experience chest pain or shortness of breath - call 911 immediately for transfer to the hospital emergency department.  °If you develop a fever greater that 101 F, purulent drainage from wound, increased redness or drainage from wound, foul odor from the wound/dressing, or calf pain - CONTACT YOUR SURGEON.   °                                                °FOLLOW-UP APPOINTMENTS °Make sure you keep all of your appointments after your operation with your surgeon and caregivers. You should call the office at the above phone number and make an appointment for approximately two weeks after the date of your surgery or on the date instructed by your surgeon outlined in the "After Visit Summary". ° ° °RANGE OF MOTION AND STRENGTHENING EXERCISES  °Rehabilitation of the knee is important following a knee injury or   an operation. After just a few days of immobilization, the muscles of the thigh which control the knee become weakened and shrink (atrophy). Knee exercises are designed to build up the tone and strength of the thigh muscles and to improve knee motion. Often times heat used for twenty to thirty minutes before working out will loosen up your tissues and help with improving the range of motion but do not use heat for the first two weeks following surgery. These exercises can be done on a training (exercise) mat, on the floor, on a table or on a bed. Use what ever works the best and is most comfortable for you Knee exercises include:  °Leg Lifts - While your knee is still immobilized in a splint or cast, you can do straight leg raises. Lift the leg to 60 degrees, hold for 3 sec, and slowly lower the leg. Repeat 10-20 times 2-3 times daily. Perform this exercise against resistance later as your knee gets better.  °Quad and Hamstring Sets - Tighten up the muscle on the front of the thigh (Quad) and hold for 5-10 sec. Repeat this 10-20 times hourly. Hamstring sets are done by pushing the  foot backward against an object and holding for 5-10 sec. Repeat as with quad sets.  °· Leg Slides: Lying on your back, slowly slide your foot toward your buttocks, bending your knee up off the floor (only go as far as is comfortable). Then slowly slide your foot back down until your leg is flat on the floor again. °· Angel Wings: Lying on your back spread your legs to the side as far apart as you can without causing discomfort.  °A rehabilitation program following serious knee injuries can speed recovery and prevent re-injury in the future due to weakened muscles. Contact your doctor or a physical therapist for more information on knee rehabilitation.  ° °IF YOU ARE TRANSFERRED TO A SKILLED REHAB FACILITY °If the patient is transferred to a skilled rehab facility following release from the hospital, a list of the current medications will be sent to the facility for the patient to continue.  When discharged from the skilled rehab facility, please have the facility set up the patient's Home Health Physical Therapy prior to being released. Also, the skilled facility will be responsible for providing the patient with their medications at time of release from the facility to include their pain medication, the muscle relaxants, and their blood thinner medication. If the patient is still at the rehab facility at time of the two week follow up appointment, the skilled rehab facility will also need to assist the patient in arranging follow up appointment in our office and any transportation needs. ° °MAKE SURE YOU:  °Understand these instructions.  °Get help right away if you are not doing well or get worse.  ° ° °Pick up stool softner and laxative for home use following surgery while on pain medications. °Do not submerge incision under water. °Please use good hand washing techniques while changing dressing each day. °May shower starting three days after surgery. °Please use a clean towel to pat the incision dry following  showers. °Continue to use ice for pain and swelling after surgery. °Do not use any lotions or creams on the incision until instructed by your surgeon. ° °Take Xarelto for two and a half more weeks, then discontinue Xarelto. °Once the patient has completed the blood thinner regimen, then take a Baby 81 mg Aspirin daily for three more weeks. ° ° °Information   on my medicine - XARELTO (Rivaroxaban)  This medication education was reviewed with me or my healthcare representative as part of my discharge preparation.  The pharmacist that spoke with me during my hospital stay was:  Emiliano Dyer, RPH  Why was Xarelto prescribed for you? Xarelto was prescribed for you to reduce the risk of blood clots forming after orthopedic surgery. The medical term for these abnormal blood clots is venous thromboembolism (VTE).  What do you need to know about xarelto ? Take your Xarelto ONCE DAILY at the same time every day. You may take it either with or without food.  If you have difficulty swallowing the tablet whole, you may crush it and mix in applesauce just prior to taking your dose.  Take Xarelto exactly as prescribed by your doctor and DO NOT stop taking Xarelto without talking to the doctor who prescribed the medication.  Stopping without other VTE prevention medication to take the place of Xarelto may increase your risk of developing a clot.  After discharge, you should have regular check-up appointments with your healthcare provider that is prescribing your Xarelto.    What do you do if you miss a dose? If you miss a dose, take it as soon as you remember on the same day then continue your regularly scheduled once daily regimen the next day. Do not take two doses of Xarelto on the same day.   Important Safety Information A possible side effect of Xarelto is bleeding. You should call your healthcare provider right away if you experience any of the following: ? Bleeding from an injury or your  nose that does not stop. ? Unusual colored urine (red or dark brown) or unusual colored stools (red or black). ? Unusual bruising for unknown reasons. ? A serious fall or if you hit your head (even if there is no bleeding).  Some medicines may interact with Xarelto and might increase your risk of bleeding while on Xarelto. To help avoid this, consult your healthcare provider or pharmacist prior to using any new prescription or non-prescription medications, including herbals, vitamins, non-steroidal anti-inflammatory drugs (NSAIDs) and supplements.  This website has more information on Xarelto: [email or web address]

## 2015-12-15 NOTE — Progress Notes (Signed)
   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  Subjective Data  Patient Stated Goal less pain  Precautions  Precautions Knee  Precaution Comments able to perform IND SLR  Required Braces or Orthoses Knee Immobilizer - Left  Knee Immobilizer - Left Discontinue once straight leg raise with < 10 degree lag  Restrictions  Other Position/Activity Restrictions WBAT  Pain Assessment  Pain Assessment 0-10  Pain Score 3  Pain Location L knee  Pain Descriptors / Indicators Aching;Grimacing;Guarding;Sore  Pain Intervention(s) Limited activity within patient's tolerance;Monitored during session;Premedicated before session  Cognition  Arousal/Alertness Awake/alert  Behavior During Therapy WFL for tasks assessed/performed  Overall Cognitive Status Within Functional Limits for tasks assessed  Bed Mobility  Bed Mobility Sit to Supine  Sit to supine Min guard  General bed mobility comments guarding LLE while lifting onto bed  Transfers  Overall transfer level Needs assistance  Equipment used Rolling walker (2 wheeled)  Transfers Sit to/from Stand  Sit to Stand Min guard  General transfer comment cues for hand placement and LLE position, repeated x2  Ambulation/Gait  Ambulation/Gait assistance Min guard  Ambulation Distance (Feet) 110 Feet  Assistive device Rolling walker (2 wheeled)  Gait Pattern/deviations Step-to pattern;Step-through pattern  General Gait Details cues for gait progression  Gait velocity decr  Total Joint Exercises  Ankle Circles/Pumps AROM;Both;10 reps  Quad Sets AROM;Both;10 reps  Heel Slides Left;AAROM;10 reps  PT - End of Session  Activity Tolerance Patient tolerated treatment well  Patient left in bed;with call bell/phone within reach  PT - Assessment/Plan  PT Plan Current plan remains appropriate  PT Frequency (ACUTE ONLY) 7X/week  Follow Up Recommendations  Outpatient PT;Home health PT  PT equipment None recommended by PT  PT Goal Progression  Progress towards PT goals Progressing toward goals  Acute Rehab PT Goals  PT Goal Formulation With patient  Time For Goal Achievement 12/18/15  Potential to Achieve Goals Good  PT Time Calculation  PT Start Time (ACUTE ONLY) 1332  PT Stop Time (ACUTE ONLY) 1353  PT Time Calculation (min) (ACUTE ONLY) 21 min  PT General Charges  $$ ACUTE PT VISIT 1 Procedure  PT Treatments  $Gait Training 8-22 mins

## 2015-12-16 LAB — CBC
HCT: 34.1 % — ABNORMAL LOW (ref 36.0–46.0)
HEMOGLOBIN: 11.3 g/dL — AB (ref 12.0–15.0)
MCH: 29 pg (ref 26.0–34.0)
MCHC: 33.1 g/dL (ref 30.0–36.0)
MCV: 87.7 fL (ref 78.0–100.0)
PLATELETS: 307 10*3/uL (ref 150–400)
RBC: 3.89 MIL/uL (ref 3.87–5.11)
RDW: 15 % (ref 11.5–15.5)
WBC: 11.7 10*3/uL — ABNORMAL HIGH (ref 4.0–10.5)

## 2015-12-16 LAB — BASIC METABOLIC PANEL
Anion gap: 4 — ABNORMAL LOW (ref 5–15)
BUN: 12 mg/dL (ref 6–20)
CO2: 28 mmol/L (ref 22–32)
CREATININE: 0.74 mg/dL (ref 0.44–1.00)
Calcium: 8.9 mg/dL (ref 8.9–10.3)
Chloride: 109 mmol/L (ref 101–111)
Glucose, Bld: 105 mg/dL — ABNORMAL HIGH (ref 65–99)
Potassium: 4.2 mmol/L (ref 3.5–5.1)
SODIUM: 141 mmol/L (ref 135–145)

## 2015-12-16 NOTE — Progress Notes (Signed)
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    PT Comments    Pt making excellent progress  Follow Up Recommendations  Outpatient PT;Home health PT     Equipment Recommendations  None recommended by PT    Recommendations for Other Services       Precautions / Restrictions Precautions Precautions: Knee Precaution Comments: able to perform IND SLR, reviewed knee precautions Restrictions Weight Bearing Restrictions: No Other Position/Activity Restrictions: WBAT    Mobility  Bed Mobility Overal bed mobility: Needs Assistance Bed Mobility: Supine to Sit     Supine to sit: Min guard;Supervision     General bed mobility comments: guarding LLE to EOB  Transfers Overall transfer level: Needs assistance Equipment used: Rolling walker (2 wheeled) Transfers: Sit to/from Stand Sit to Stand: Min guard         General transfer comment: cues for hand placement and LLE position, repeated x2  Ambulation/Gait Ambulation/Gait assistance: Min guard;Supervision Ambulation Distance (Feet): 120 Feet Assistive device: Rolling walker (2 wheeled) Gait Pattern/deviations: Step-through pattern     General Gait Details: cues for gait progression,posture and breathing;    Stairs            Wheelchair Mobility    Modified Rankin (Stroke Patients Only)       Balance                                    Cognition Arousal/Alertness: Awake/alert Behavior During Therapy: WFL for tasks assessed/performed Overall Cognitive Status: Within Functional Limits for tasks assessed                      Exercises Total Joint Exercises Ankle Circles/Pumps: AROM;Both;10 reps Quad Sets: AROM;Both;10 reps Short Arc Quad: AAROM;AROM;Strengthening;Left;10 reps Heel Slides: Left;AAROM;10 reps Hip ABduction/ADduction:  AROM;Left;10 reps Straight Leg Raises: AAROM;AROM;Left;10 reps Goniometric ROM: grossly 6* to 90* flexion    General Comments        Pertinent Vitals/Pain Pain Assessment: 0-10 Pain Score: 8  Pain Location: L knee with activity Pain Descriptors / Indicators: Grimacing;Guarding;Sore Pain Intervention(s): Limited activity within patient's tolerance;Monitored during session;Premedicated before session;Ice applied    Home Living                      Prior Function            PT Goals (current goals can now be found in the care plan section) Acute Rehab PT Goals Patient Stated Goal: less pain PT Goal Formulation: With patient Time For Goal Achievement: 12/18/15 Potential to Achieve Goals: Good Progress towards PT goals: Progressing toward goals    Frequency    7X/week      PT Plan Current plan remains appropriate    Co-evaluation             End of Session Equipment Utilized During Treatment: Gait belt Activity Tolerance: Patient tolerated treatment well Patient left: with call bell/phone within reach;in chair     Time: [id number] PT Time Calculation (min) (ACUTE ONLY): 26 min  Charges:  $Gait Training: 8-22 mins $Therapeutic Exercise: 8-22 mins                    G Codes:      Patricia Morton Dec 19, 2015, 10:00 AM

## 2015-12-16 NOTE — Care Management Note (Signed)
Case Management Note  Patient Details  Name: Patricia Morton MRN: [id number] Date of Birth: [birth year]  Subjective/Objective:                  LEFT TOTAL KNEE ARTHROPLASTY (Left) Action/Plan: Discharge planning Expected Discharge Date:  12/16/15               Expected Discharge Plan:  [name]  In-House Referral:     Discharge planning Services  CM Consult  Post Acute Care Choice:  Home Health Choice offered to:  Patient  DME Arranged:  N/A DME Agency:  NA  HH Arranged:  PT [place] Agency:  [facility] ([facility]  Status of Service:  Completed, signed off  If discussed at [employer], dates discussed:    Additional Comments: CM met with pt in room to offer choice of home health agency.  Pt chooses [facility] to render HHPT.  Referral given to [facility] rep, Tim.  Pt has all DMe from previous surgery.  No other Cm needs were communicated. Dellie Catholic, RN 12/16/2015, 2:20 PM

## 2015-12-16 NOTE — Progress Notes (Signed)
   Subjective: 2 Days Post-Op Procedure(s) (LRB): LEFT TOTAL KNEE ARTHROPLASTY (Left) Patient reports pain as mild.   Patient seen in rounds with Dr. Wynelle Link. Patient is well, but has had some minor complaints of pain in the kne, requiring pain medications Patient is ready to go home.  Objective: Vital signs in last 24 hours: Temp:  [97.5 F (36.4 C)-98.4 F (36.9 C)] 97.7 F (36.5 C) (10/04 0536) Pulse Rate:  [64-102] 73 (10/04 0536) Resp:  [16-18] 16 (10/04 0536) BP: (120-168)/(52-84) 161/84 (10/04 0536) SpO2:  [95 %-99 %] 99 % (10/04 0536)  Intake/Output from previous day:  Intake/Output Summary (Last 24 hours) at 12/16/15 0749 Last data filed at 12/16/15 0600  Gross per 24 hour  Intake             1400 ml  Output             2150 ml  Net             -750 ml    Intake/Output this shift: No intake/output data recorded.  Labs:  Recent Labs  12/15/15 0422 12/16/15 0435  HGB 10.8* 11.3*    Recent Labs  12/15/15 0422 12/16/15 0435  WBC 10.9* 11.7*  RBC 3.63* 3.89  HCT 33.0* 34.1*  PLT 306 307    Recent Labs  12/15/15 0422 12/16/15 0435  NA 138 141  K 4.0 4.2  CL 107 109  CO2 25 28  BUN 11 12  CREATININE 0.81 0.74  GLUCOSE 148* 105*  CALCIUM 8.4* 8.9   No results for input(s): LABPT, INR in the last 72 hours.  EXAM: General - Patient is Alert, Appropriate and Oriented Extremity - Neurovascular intact Sensation intact distally Dorsiflexion/Plantar flexion intact Incision - clean, dry, no drainage, healing Motor Function - intact, moving foot and toes well on exam.   Assessment/Plan: 2 Days Post-Op Procedure(s) (LRB): LEFT TOTAL KNEE ARTHROPLASTY (Left) Procedure(s) (LRB): LEFT TOTAL KNEE ARTHROPLASTY (Left) Past Medical History:  Diagnosis Date  . Arthritis   . Hyperlipemia   . Hypertension   . Hypothyroid    Active Problems:   OA (osteoarthritis) of knee  Estimated body mass index is 25.69 kg/m as calculated from the following:  Height as of this encounter: 5\' 3"  (1.6 m).   Weight as of this encounter: 65.8 kg (145 lb). Up with therapy Discharge home with home health Diet - Cardiac diet Follow up - in 2 weeks Activity - WBAT Disposition - Home Condition Upon Discharge - Stable D/C Meds - See DC Summary DVT Prophylaxis - Xarelto  Arlee Muslim, PA-C Orthopaedic Surgery 12/16/2015, 7:49 AM

## 2017-11-12 DIAGNOSIS — R002 Palpitations: Secondary | ICD-10-CM | POA: Diagnosis not present

## 2017-11-12 DIAGNOSIS — R42 Dizziness and giddiness: Secondary | ICD-10-CM | POA: Diagnosis not present

## 2017-11-14 DIAGNOSIS — R002 Palpitations: Secondary | ICD-10-CM | POA: Diagnosis not present

## 2017-11-14 DIAGNOSIS — R42 Dizziness and giddiness: Secondary | ICD-10-CM | POA: Diagnosis not present

## 2017-11-17 DIAGNOSIS — G479 Sleep disorder, unspecified: Secondary | ICD-10-CM | POA: Diagnosis not present

## 2017-12-22 DIAGNOSIS — Z23 Encounter for immunization: Secondary | ICD-10-CM | POA: Diagnosis not present

## 2018-01-10 DIAGNOSIS — L820 Inflamed seborrheic keratosis: Secondary | ICD-10-CM | POA: Diagnosis not present

## 2018-01-10 DIAGNOSIS — D225 Melanocytic nevi of trunk: Secondary | ICD-10-CM | POA: Diagnosis not present

## 2018-01-10 DIAGNOSIS — D485 Neoplasm of uncertain behavior of skin: Secondary | ICD-10-CM | POA: Diagnosis not present

## 2018-01-10 DIAGNOSIS — Z85828 Personal history of other malignant neoplasm of skin: Secondary | ICD-10-CM | POA: Diagnosis not present

## 2018-01-10 DIAGNOSIS — Z1283 Encounter for screening for malignant neoplasm of skin: Secondary | ICD-10-CM | POA: Diagnosis not present

## 2018-01-10 DIAGNOSIS — Z08 Encounter for follow-up examination after completed treatment for malignant neoplasm: Secondary | ICD-10-CM | POA: Diagnosis not present

## 2018-01-14 DIAGNOSIS — Z8489 Family history of other specified conditions: Secondary | ICD-10-CM | POA: Diagnosis not present

## 2018-01-14 DIAGNOSIS — E039 Hypothyroidism, unspecified: Secondary | ICD-10-CM | POA: Diagnosis not present

## 2018-01-14 DIAGNOSIS — Z8261 Family history of arthritis: Secondary | ICD-10-CM | POA: Diagnosis not present

## 2018-01-14 DIAGNOSIS — Z79899 Other long term (current) drug therapy: Secondary | ICD-10-CM | POA: Diagnosis not present

## 2018-01-14 DIAGNOSIS — H269 Unspecified cataract: Secondary | ICD-10-CM | POA: Diagnosis not present

## 2018-01-14 DIAGNOSIS — R0789 Other chest pain: Secondary | ICD-10-CM | POA: Diagnosis not present

## 2018-01-14 DIAGNOSIS — Z87891 Personal history of nicotine dependence: Secondary | ICD-10-CM | POA: Diagnosis not present

## 2018-01-14 DIAGNOSIS — Z96653 Presence of artificial knee joint, bilateral: Secondary | ICD-10-CM | POA: Diagnosis not present

## 2018-01-14 DIAGNOSIS — E7800 Pure hypercholesterolemia, unspecified: Secondary | ICD-10-CM | POA: Diagnosis not present

## 2018-01-14 DIAGNOSIS — Z85828 Personal history of other malignant neoplasm of skin: Secondary | ICD-10-CM | POA: Diagnosis not present

## 2018-01-14 DIAGNOSIS — R946 Abnormal results of thyroid function studies: Secondary | ICD-10-CM | POA: Diagnosis not present

## 2018-01-14 DIAGNOSIS — Z8249 Family history of ischemic heart disease and other diseases of the circulatory system: Secondary | ICD-10-CM | POA: Diagnosis not present

## 2018-01-14 DIAGNOSIS — R079 Chest pain, unspecified: Secondary | ICD-10-CM | POA: Diagnosis not present

## 2018-01-14 DIAGNOSIS — R42 Dizziness and giddiness: Secondary | ICD-10-CM | POA: Diagnosis not present

## 2018-01-14 DIAGNOSIS — R9439 Abnormal result of other cardiovascular function study: Secondary | ICD-10-CM | POA: Diagnosis not present

## 2018-01-14 DIAGNOSIS — R61 Generalized hyperhidrosis: Secondary | ICD-10-CM | POA: Diagnosis not present

## 2018-01-14 DIAGNOSIS — I10 Essential (primary) hypertension: Secondary | ICD-10-CM | POA: Diagnosis not present

## 2018-01-15 DIAGNOSIS — R9439 Abnormal result of other cardiovascular function study: Secondary | ICD-10-CM | POA: Diagnosis not present

## 2018-01-15 DIAGNOSIS — I10 Essential (primary) hypertension: Secondary | ICD-10-CM | POA: Diagnosis not present

## 2018-01-15 DIAGNOSIS — E039 Hypothyroidism, unspecified: Secondary | ICD-10-CM | POA: Diagnosis not present

## 2018-01-15 DIAGNOSIS — R079 Chest pain, unspecified: Secondary | ICD-10-CM | POA: Diagnosis not present

## 2018-01-17 DIAGNOSIS — R079 Chest pain, unspecified: Secondary | ICD-10-CM | POA: Diagnosis not present

## 2018-02-01 ENCOUNTER — Encounter: Payer: Self-pay | Admitting: *Deleted

## 2018-02-02 ENCOUNTER — Ambulatory Visit: Payer: Medicare Other | Admitting: Family Medicine

## 2018-02-07 ENCOUNTER — Encounter: Payer: Self-pay | Admitting: Family Medicine

## 2018-02-07 ENCOUNTER — Ambulatory Visit (INDEPENDENT_AMBULATORY_CARE_PROVIDER_SITE_OTHER): Payer: Medicare Other | Admitting: Family Medicine

## 2018-02-07 VITALS — BP 136/80 | HR 63 | Temp 97.7°F | Ht 63.0 in | Wt 147.0 lb

## 2018-02-07 DIAGNOSIS — R0789 Other chest pain: Secondary | ICD-10-CM | POA: Diagnosis not present

## 2018-02-07 DIAGNOSIS — F419 Anxiety disorder, unspecified: Secondary | ICD-10-CM | POA: Diagnosis not present

## 2018-02-07 DIAGNOSIS — Z7689 Persons encountering health services in other specified circumstances: Secondary | ICD-10-CM

## 2018-02-07 DIAGNOSIS — R300 Dysuria: Secondary | ICD-10-CM | POA: Diagnosis not present

## 2018-02-07 DIAGNOSIS — Z09 Encounter for follow-up examination after completed treatment for conditions other than malignant neoplasm: Secondary | ICD-10-CM

## 2018-02-07 DIAGNOSIS — I2510 Atherosclerotic heart disease of native coronary artery without angina pectoris: Secondary | ICD-10-CM

## 2018-02-07 DIAGNOSIS — E039 Hypothyroidism, unspecified: Secondary | ICD-10-CM

## 2018-02-07 DIAGNOSIS — I10 Essential (primary) hypertension: Secondary | ICD-10-CM

## 2018-02-07 LAB — URINALYSIS, ROUTINE W REFLEX MICROSCOPIC
Bilirubin Urine: NEGATIVE
Glucose, UA: NEGATIVE
Hgb urine dipstick: NEGATIVE
Ketones, ur: NEGATIVE
Nitrite: NEGATIVE
PH: 6.5 (ref 5.0–8.0)
Protein, ur: NEGATIVE
RBC / HPF: NONE SEEN /HPF (ref 0–2)
SPECIFIC GRAVITY, URINE: 1.015 (ref 1.001–1.03)

## 2018-02-07 LAB — MICROSCOPIC MESSAGE

## 2018-02-07 MED ORDER — CEPHALEXIN 500 MG PO CAPS: 500.0000 mg | ORAL_CAPSULE | Freq: Four times a day (QID) | ORAL | 0 refills | Status: AC

## 2018-02-07 NOTE — Progress Notes (Signed)
Patient ID: Patricia Morton, female    DOB: 12-Aug-1926, 84 y.o.   MRN: [id number]  PCP: Patricia Lair., MD  Chief Complaint  Patient presents with  . Establish Care    Patient has c/o urinary hesitancy and buring with urination.    Subjective:   Patricia Morton is a 84 y.o. female, presents to clinic with CC of dysuria and urinary hesitation and is new to establish care, also was recently admitted to the hospital for CP with negative workup pain suspected to be secondary to anxiety.  Urinary sx for 1-1.5 weeks.  She has urinary urgency and frequency, with difficulty urinating and decreased urine volume.  She also will feel the need to urinate immediately after going to the restroom.  She denies hematuria, abdominal pain, nausea, decreased appetite, back pain, flank pain, fever, sweats, H/C chills.  The patient and her daughter who lives with her and is taking care of her, they both deny any change in balance, mental status, they deny confusion from her baseline with some mild forgetfulness.  She is new pt here to establish care, was previously seen by Patricia Morton as her PCP, but he retired within the past year.  She did go to the ER and was admitted to the hospital at [facility] rocking him health care on 01/14/2018 - 01/15/18 she had a chest pain rule out admission, had an abnormal stress test and EKG, but negative serial troponins.  CP was suspected to be secondary to anxiety. She did follow up closely outpt with cardiology. During admission TSH was elevated 22.42, and her levothyroxine medicine was increased from 50 mcg to 62.5 daily.  She has followed up outpatient with cardiology who explained that there is a small area of stenosis found with stress test, but he did not believe it was related to her symptoms and would like to wait before doing anything invasive.  Patient has had no chest pain since the hospital admission and outpt cardiac follow-up, she has not used her nitroglycerin.  She reports that  she did have some acid reflux and indigestion that she believes was related to some of her pain when she went to the hospital and she also no longer has those symptoms.  Anxiety medication was changed from Paxil to citalopram 10 mg in the morning around.  She takes around 9 AM and she feels that her anxiety is much better than it was before, she is tolerating medicines without any side effects, she is not having anxiety attacks.  She does feel some vague SE in the morning after taking her levothyroxine med since having the dose increased, but it wears off by the afternoon.   Patient's daughter is in the room states that she is currently moving her from her home to move in with her.  Per hospital record pt is full code, does have advance directives and living will - we will need to get a copy.  She does live in [place] but is going to move in with her daughter and has mostly been staying with her daughter since earlier this month.  They would like to establish care here and in the surrounding area/.  Pt's grandaughter works at [employer] and they would like to establish with [facility] cardiologist.  The pt's daughter explains that the pt is very anxious about several things including the lack of having a doctor (PCP), she is nervous about turning 84 years old, she is anxious about incidental findings years  ago such as a renal cyst.  She is in very good health overall but she has tendency to worry excessively.  She has noticed a significant improvement in her mothers mood and anxiety since starting citalopram 3 weeks ago.  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    Some records available and brought in with the patient from her admission to [facility] care, discharge instructions from 01/15/2018, states the problem was anxiety chest pain, was  to stop Paxil start citalopram, begin aspirin but this was later discontinued by the cardiologist, change in Synthroid medications from 50 mcg to adding half of a 25 mcg pill for a total daily dose of 62.5 lab work pertinent positives were notated above.  I do not have any EKGs or cardiac test results, medical records were requested  Requested PCP records  Cardiologist records available in care everywhere -to briefly summarize she is a former smoker, Dr. Olevia Morton (outpt cardiologist) notes that she had atypical chest pain and abnormal stress test, single episode of chest pain precipitated by emotional stress, no exertional angina or dyspnea at baseline stress echo negative but ECG positive, he wanted her to continue simvastatin 40 mill grams daily, metoprolol 50 mg BID, and discontinue aspirin.  Cardiology plan is to continue monitoring for either recurrence of chest pain or any worsening exertional symptoms and then he would feel the benefit of invasive coronary angiogram would be worth the risk, but for now he did not feel she needed invasive intervention.  Pt's next of kin is Daughter Patricia Morton (pt at our practice) [phone] Pt widowed She has NKDA PMHx includes palpitations, elevated TSH, CP and anxiety  Reviewed all available records, Med hx with A&P, immunizations and updated chart as able through care everywhere   Patient Active Problem List   Diagnosis Date Noted  . Osteoarthritis, generalized 02/09/2018  . Pure hypercholesterolemia 02/09/2018  . Insomnia 02/09/2018  . Depression, controlled 02/09/2018  . Cyst of right kidney 02/09/2018  . Benign essential hypertension 02/09/2018  . Anxiety disorder, unspecified 02/09/2018  . Acquired hypothyroidism 02/09/2018  . OA (osteoarthritis) of knee 05/25/2015   Outpatient Encounter Medications as of 02/07/2018  Medication Sig  . Calcium-Magnesium-Vitamin D (CALCIUM 500 PO) Take by mouth.  . cephALEXin (KEFLEX) 500 MG capsule Take 1  capsule (500 mg total) by mouth 4 (four) times daily for 5 days.  . citalopram (CELEXA) 10 MG tablet TAKE 1 TABLET BY MOUTH EVERY DAY FOR ANXIETY/DEPRESSION  . levothyroxine (SYNTHROID, LEVOTHROID) 25 MCG tablet TAKE 1/2 TABLET BY MOUTH EVERY DAY AT 730 AM  . metoprolol (LOPRESSOR) 50 MG tablet Take 50 mg by mouth daily.   . simvastatin (ZOCOR) 40 MG tablet Take 40 mg by mouth at bedtime.  . triamterene-hydrochlorothiazide (MAXZIDE-25) 37.5-25 MG tablet Take 0.5 tablets by mouth daily.  Marland Kitchen levothyroxine (SYNTHROID, LEVOTHROID) 50 MCG tablet Take 1 tablet (50 mcg total) by mouth daily before breakfast.  . traZODone (DESYREL) 50 MG tablet Take 50 mg by mouth at bedtime.  . [DISCONTINUED] levothyroxine (SYNTHROID, LEVOTHROID) 50 MCG tablet Take 50 mcg by mouth daily before breakfast.  . [DISCONTINUED] PARoxetine (PAXIL) 10 MG tablet Take 10 mg by mouth daily.   No facility-administered encounter medications on file as of 02/07/2018.    No Known Allergies  Past Surgical History:  Procedure Laterality Date  . ABDOMINAL  HYSTERECTOMY    . BREAST SURGERY     right breast -lumpectomy-benign  . CYSTECTOMY    . EYE SURGERY    . TOTAL KNEE ARTHROPLASTY Right 05/25/2015   Procedure: TOTAL KNEE ARTHROPLASTY;  Surgeon: Gaynelle Arabian, MD;  Location: [facility];  Service: Orthopedics;  Laterality: Right;  . TOTAL KNEE ARTHROPLASTY Left 12/14/2015   Procedure: LEFT TOTAL KNEE ARTHROPLASTY;  Surgeon: Gaynelle Arabian, MD;  Location: [facility];  Service: Orthopedics;  Laterality: Left;   Social History   Tobacco Use  . Smoking status: Former Smoker    Years: 5.00    Types: Cigarettes  . Smokeless tobacco: Never Used  Substance Use Topics  . Alcohol use: Not Currently    Comment: wine occassionally  . Drug use: No     Family History  Problem Relation Age of Onset  . Heart disease Brother      Review of Systems  Constitutional: Negative.  Negative for activity change, appetite change, chills,  diaphoresis, fatigue, fever and unexpected weight change.  HENT: Negative.   Eyes: Negative.   Respiratory: Negative.  Negative for cough, chest tightness and shortness of breath.   Cardiovascular: Negative.  Negative for chest pain, palpitations and leg swelling.  Gastrointestinal: Negative.   Endocrine: Negative.   Genitourinary: Positive for difficulty urinating, dysuria and frequency. Negative for flank pain, genital sores, hematuria and pelvic pain.  Musculoskeletal: Negative.   Skin: Negative.   Allergic/Immunologic: Negative.   Neurological: Negative.  Negative for dizziness, syncope, weakness, light-headedness and numbness.  Hematological: Negative.   Psychiatric/Behavioral: Negative.  Negative for agitation, behavioral problems, confusion, decreased concentration, dysphoric mood and sleep disturbance. The patient is not nervous/anxious.   All other systems reviewed and are negative.      Objective:    Vitals:   02/07/18 0846  BP: 136/80  Pulse: 63  Temp: 97.7 F (36.5 C)  TempSrc: Oral  SpO2: 99%  Weight: 147 lb (66.7 kg)  Height: 5\' 3"  (1.6 m)      Physical Exam  Constitutional: She appears well-developed and well-nourished.  Non-toxic appearance. No distress.  Elderly female, well appearing  HENT:  Head: Normocephalic and atraumatic.  Right Ear: External ear normal.  Left Ear: External ear normal.  Nose: Nose normal.  Mouth/Throat: Uvula is midline and mucous membranes are normal. No oropharyngeal exudate.  Oral mucosa slightly dry  Eyes: Pupils are equal, round, and reactive to light. Conjunctivae, EOM and lids are normal.  Neck: Normal range of motion and phonation normal. Neck supple. No tracheal deviation present.  Cardiovascular: Normal rate, regular rhythm, normal heart sounds, intact distal pulses and normal pulses. Exam reveals no gallop and no friction rub.  No murmur heard. Pulses:      Radial pulses are 2+ on the right side, and 2+ on the left  side.       Posterior tibial pulses are 2+ on the right side, and 2+ on the left side.  No LE edema  Pulmonary/Chest: Effort normal and breath sounds normal. No stridor. No respiratory distress. She has no wheezes. She has no rhonchi. She has no rales. She exhibits no tenderness.  Abdominal: Soft. Normal appearance and bowel sounds are normal. She exhibits no distension and no mass. There is no tenderness. There is no rebound and no guarding.  No CVA tenderness b/l  Musculoskeletal: Normal range of motion. She exhibits no edema or deformity.  Lymphadenopathy:    She has no cervical adenopathy.  Neurological: She is alert. She  exhibits normal muscle tone. Gait normal.  Skin: Skin is warm, dry and intact. Capillary refill takes less than 2 seconds. No rash noted. She is not diaphoretic. No pallor.  Psychiatric: She has a normal mood and affect. Her speech is normal and behavior is normal.  Nursing note and vitals reviewed.   Results for orders placed or performed in visit on 02/07/18  Urine Culture  Result Value Ref Range   MICRO NUMBER: [id number]    SPECIMEN QUALITY: Adequate    Sample Source URINE    STATUS: FINAL    Result: No Growth   Urinalysis, Routine w reflex microscopic  Result Value Ref Range   Color, Urine YELLOW YELLOW   APPearance CLEAR CLEAR   Specific Gravity, Urine 1.015 1.001 - 1.03   pH 6.5 5.0 - 8.0   Glucose, UA NEGATIVE NEGATIVE   Bilirubin Urine NEGATIVE NEGATIVE   Ketones, ur NEGATIVE NEGATIVE   Hgb urine dipstick NEGATIVE NEGATIVE   Protein, ur NEGATIVE NEGATIVE   Nitrite NEGATIVE NEGATIVE   Leukocytes, UA 2+ (A) NEGATIVE   WBC, UA 6-10 (A) 0 - 5 /HPF   RBC / HPF NONE SEEN 0 - 2 /HPF   Squamous Epithelial / LPF 0-5 < OR = 5 /HPF   Bacteria, UA FEW (A) NONE SEEN /HPF  Microscopic Message  Result Value Ref Range   Note           Assessment & Plan:   84 yearold female here to establish care also has acute urinary complaints and was recently  hospitalized 11/3-11/4/19, she had outpt cardiology f/up and no PCP, so is here to f/up from hospitalization as well.     Dx and A&P - no specific order   1. Hypothyroidism, unspecified type Recent dose change with elevated TSH of 22 - found during hospitalization Levothyroxine dose changed 3 weeks ago from 50 mcg qam to 62.5 mcg q am  Patient is having some side effects when she takes her thyroid medication first thing in the morning on empty stomach she feels a little dizzy she states but the symptom resolves and is not present the rest of the day.  Is taking the medicine correctly on empty stomach and waiting till later in the day to take her other medicines  Continue same dose, recheck labs in 3 weeks - TSH; Future - COMPLETE METABOLIC PANEL WITH GFR; Future  2. Anxiety Doing better with change from paxil to citalopram.  We did discuss need to possibly increase dose, but will continue for now with 10 mg dose, and recheck in 3 weeks (6 week f/up from med changes) to reassess med effectiveness.  After reviewing care everywhere records, pt was previously on benzos from sleep, tradozone for sleep and paxil and zoloft in chart.  Not taking any of these currently.  The pt or her daughter did not mention any difficulty sleeping, will follow up on insomnia and other meds at her recheck appt.  3. Atypical chest pain Following with cardiology, small vessel with stenosis/CAD, tx with statin, metoprolol No ASA Monitoring for angina and/or exertional sx None currently  4. Encounter to establish care with new doctor Reviewed all available records Requested past PCP records and cardiology test result records Everything updated as able, including recent immunizations/vaccines, PMHx, SHx, meds, allergies, advanced directives - need copy  5. Encounter for examination following treatment at hospital Available hospital record and results reviewed.  CP - followed by cardiology - referral to local  cardiologist Anxiety -  see above, meds and sx reviewed Elevated TSH - med changes reviewed - see above  6. Dysuria Urinary frequency, urgency, dysuria and decreased urine volume. She had difficulty giving sample today, after multiple tries did finally leave a sample, dip was done, + for leuks, decided to tx emperically with Keflex, microscopy and culture were pending when pt left clinic.  Later microscopy was completed did look suspicious for UTI.  Will review C&S No hx of UTI for pt, pt afebrile, no abd/CVA tenderness - pt staying with daughter, feels safe for outpt tx and no concerns for pyelo, encephalopathy or balance issues  - Urinalysis, Routine w reflex microscopic - Urine Culture - cephALEXin (KEFLEX) 500 MG capsule; Take 1 capsule (500 mg total) by mouth 4 (four) times daily for 5 days.  Dispense: 20 capsule; Refill: 0     Future labs for three weeks from now, TSH and CMP Follow up OV in 3 weeks  Delsa Grana, PA-C 02/09/18 11:38 AM

## 2018-02-07 NOTE — Patient Instructions (Signed)
Continue your thyroid medicine with the same dose  Continue anxiety medicine with same dose.  Recheck labs and do office visit in 3 weeks to recheck everything.

## 2018-02-08 LAB — URINE CULTURE
MICRO NUMBER: 91430762
Result:: NO GROWTH
SPECIMEN QUALITY: ADEQUATE

## 2018-02-09 ENCOUNTER — Encounter: Payer: Self-pay | Admitting: Family Medicine

## 2018-02-09 DIAGNOSIS — R0789 Other chest pain: Secondary | ICD-10-CM | POA: Insufficient documentation

## 2018-02-09 DIAGNOSIS — M159 Polyosteoarthritis, unspecified: Secondary | ICD-10-CM | POA: Insufficient documentation

## 2018-02-09 DIAGNOSIS — I10 Essential (primary) hypertension: Secondary | ICD-10-CM | POA: Insufficient documentation

## 2018-02-09 DIAGNOSIS — E7800 Pure hypercholesterolemia, unspecified: Secondary | ICD-10-CM | POA: Insufficient documentation

## 2018-02-09 DIAGNOSIS — F32A Depression, unspecified: Secondary | ICD-10-CM | POA: Insufficient documentation

## 2018-02-09 DIAGNOSIS — G4700 Insomnia, unspecified: Secondary | ICD-10-CM | POA: Insufficient documentation

## 2018-02-09 DIAGNOSIS — N281 Cyst of kidney, acquired: Secondary | ICD-10-CM | POA: Insufficient documentation

## 2018-02-09 DIAGNOSIS — E039 Hypothyroidism, unspecified: Secondary | ICD-10-CM | POA: Insufficient documentation

## 2018-02-09 DIAGNOSIS — I2510 Atherosclerotic heart disease of native coronary artery without angina pectoris: Secondary | ICD-10-CM | POA: Insufficient documentation

## 2018-02-09 DIAGNOSIS — F419 Anxiety disorder, unspecified: Secondary | ICD-10-CM | POA: Insufficient documentation

## 2018-02-09 DIAGNOSIS — F329 Major depressive disorder, single episode, unspecified: Secondary | ICD-10-CM | POA: Insufficient documentation

## 2018-02-09 MED ORDER — LEVOTHYROXINE SODIUM 50 MCG PO TABS: 50.0000 ug | ORAL_TABLET | Freq: Every day | ORAL | Status: DC

## 2018-02-09 NOTE — Assessment & Plan Note (Signed)
Currently taking 62.5 mcg, mild SE from meds Last TSH elevated ~22 on 01/15/18 Recheck in 3 weeks and OV f/up

## 2018-02-16 DIAGNOSIS — L239 Allergic contact dermatitis, unspecified cause: Secondary | ICD-10-CM | POA: Diagnosis not present

## 2018-02-26 ENCOUNTER — Ambulatory Visit: Payer: Medicare Other | Admitting: Family Medicine

## 2018-02-26 ENCOUNTER — Encounter: Payer: Self-pay | Admitting: Family Medicine

## 2018-02-26 ENCOUNTER — Other Ambulatory Visit: Payer: Self-pay

## 2018-02-26 VITALS — BP 132/62 | HR 64 | Temp 98.9°F | Resp 14 | Ht 63.0 in | Wt 149.0 lb

## 2018-02-26 DIAGNOSIS — T7840XD Allergy, unspecified, subsequent encounter: Secondary | ICD-10-CM

## 2018-02-26 DIAGNOSIS — E039 Hypothyroidism, unspecified: Secondary | ICD-10-CM | POA: Diagnosis not present

## 2018-02-26 DIAGNOSIS — F411 Generalized anxiety disorder: Secondary | ICD-10-CM

## 2018-02-26 DIAGNOSIS — I10 Essential (primary) hypertension: Secondary | ICD-10-CM

## 2018-02-26 DIAGNOSIS — L509 Urticaria, unspecified: Secondary | ICD-10-CM

## 2018-02-26 DIAGNOSIS — F32A Depression, unspecified: Secondary | ICD-10-CM

## 2018-02-26 DIAGNOSIS — F329 Major depressive disorder, single episode, unspecified: Secondary | ICD-10-CM

## 2018-02-26 DIAGNOSIS — E7800 Pure hypercholesterolemia, unspecified: Secondary | ICD-10-CM | POA: Diagnosis not present

## 2018-02-26 MED ORDER — ESCITALOPRAM OXALATE 5 MG PO TABS: 5.0000 mg | ORAL_TABLET | Freq: Every day | ORAL | 2 refills | Status: DC

## 2018-02-26 MED ORDER — PREDNISONE 10 MG PO TABS: ORAL_TABLET | ORAL | 0 refills | Status: DC

## 2018-02-26 MED ORDER — FAMOTIDINE 20 MG PO TABS: 20.0000 mg | ORAL_TABLET | Freq: Two times a day (BID) | ORAL | 0 refills | Status: DC

## 2018-02-26 MED ORDER — METHYLPREDNISOLONE ACETATE 80 MG/ML IJ SUSP
80.0000 mg | Freq: Once | INTRAMUSCULAR | Status: AC
  Administered 2018-02-26: 80 mg via INTRAMUSCULAR

## 2018-02-26 NOTE — Assessment & Plan Note (Signed)
Blood pressure is controlled.  She is due for fasting labs will obtain these today.

## 2018-02-26 NOTE — Patient Instructions (Signed)
Take 1/2 tablet of celexa for 3 days Then start lexapro 5mg  once a day  Take the pepcid twice a day for rash for 10 days Take the prednisone taper Continue benadryl or atarax We will call with lab results  F/U 4 weeks

## 2018-02-26 NOTE — Assessment & Plan Note (Signed)
In the setting of her hives with the Celexa will try something similar to it with the Lexapro and see how she does.  She will taper off the Celexa by decreasing to half a tablet for 3 days then starting the Lexapro

## 2018-02-26 NOTE — Assessment & Plan Note (Addendum)
She is taking 62.5 mg states that it was recently changed.  Recheck her thyroid function studies to see if dose needs to be adjusted

## 2018-02-26 NOTE — Progress Notes (Signed)
Subjective:    Patient ID: Patricia Morton, female    DOB: 28-Jun-1926, 84 y.o.   MRN: [id number]  Patient presents for Hives (was seen at [facility] on 12/6- put on antihistamine and pred- after pred completed, hives returned- ack, abd, legs)  Pt here with rash. She was seen on 12/6 at [facility] had rash on her back with itching all over for about a week before presenting to UC. SHe was given prednisone after benadryl did not work. Depo Medrol 80mg  Im was also given  Note she was given antibiotics on 11/27 - Keflex  For UTI Looking back, she had the rash though milder and itching after she was changed to celexa recently. This was before the antibiotics for UTI. Both patient and daughter are concerned about that medicaiton, though it has worked wonders for her anxiety.  Once she completed prednisone from UC rash went away for a few days then returned all over, including arms, legs, trunk, she is still taking atarax and benadryl, no lip or tongue swelling, no difficulty breathing, no fever, no CP, SOB  she did not change to allergic free detergent soap until after she had the rash, no new foods   Review Of Systems: pe above   GEN- denies fatigue, fever, weight loss,weakness, recent illness HEENT- denies eye drainage, change in vision, nasal discharge, CVS- denies chest pain, palpitations RESP- denies SOB, cough, wheeze ABD- denies N/V, change in stools, abd pain GU- denies dysuria, hematuria, dribbling, incontinence MSK- denies joint pain, muscle aches, injury Neuro- denies headache, dizziness, syncope, seizure activity       Objective:    BP 132/62   Pulse 64   Temp 98.9 F (37.2 C) (Oral)   Resp 14   Ht 5\' 3"  (1.6 m)   Wt 149 lb (67.6 kg)   SpO2 100%   BMI 26.39 kg/m  GEN- NAD, alert and oriented x3 HEENT- PERRL, EOMI, non injected sclera, pink conjunctiva, MMM, oropharynx clear Neck- Supple, no LAD  CVS- RRR, no murmur RESP-CTAB Skin - generalized urticaria/hives on trunk arms,  thighs, popliteal region , abdomen,no lesions on hands  o oral lesions,no lesions on face  EXT- No edema Pulses- Radial 2+        Assessment & Plan:      Problem List Items Addressed This Visit      Unprioritized   Acquired hypothyroidism - Primary    She is taking 62.5 mg states that it was recently changed.  Recheck her thyroid function studies to see if dose needs to be adjusted      Relevant Orders   TSH   T4, free   Anxiety disorder, unspecified   Relevant Medications   escitalopram (LEXAPRO) 5 MG tablet   Benign essential hypertension    Blood pressure is controlled.  She is due for fasting labs will obtain these today.      Relevant Orders   CBC with Differential/Platelet   Lipid panel   Comprehensive metabolic panel   Depression, controlled    In the setting of her hives with the Celexa will try something similar to it with the Lexapro and see how she does.  She will taper off the Celexa by decreasing to half a tablet for 3 days then starting the Lexapro      Relevant Medications   escitalopram (LEXAPRO) 5 MG tablet   Pure hypercholesterolemia   Relevant Orders   Lipid panel    Other Visit Diagnoses    Allergic  reaction to drug, subsequent encounter       [place] to celexa based on timing of medication, will d/c celexa, start lexapro   Relevant Medications   methylPREDNISolone acetate (DEPO-MEDROL) injection 80 mg (Completed)   Hives       Depo-Medrol 80 mg given prednisone taper Pepcid 10 mg twice a day for 10 days.  Benadryl as needed   Relevant Medications   methylPREDNISolone acetate (DEPO-MEDROL) injection 80 mg (Completed)      Note: This dictation was prepared with Dragon dictation along with smaller phrase technology. Any transcriptional errors that result from this process are unintentional.

## 2018-02-27 ENCOUNTER — Other Ambulatory Visit: Payer: Medicare Other

## 2018-02-27 LAB — CBC WITH DIFFERENTIAL/PLATELET
ABSOLUTE MONOCYTES: 405 {cells}/uL (ref 200–950)
BASOS ABS: 18 {cells}/uL (ref 0–200)
BASOS PCT: 0.2 %
EOS ABS: 36 {cells}/uL (ref 15–500)
Eosinophils Relative: 0.4 %
HCT: 43.4 % (ref 35.0–45.0)
Hemoglobin: 14.2 g/dL (ref 11.7–15.5)
Lymphs Abs: 810 cells/uL — ABNORMAL LOW (ref 850–3900)
MCH: 30.9 pg (ref 27.0–33.0)
MCHC: 32.7 g/dL (ref 32.0–36.0)
MCV: 94.6 fL (ref 80.0–100.0)
MONOS PCT: 4.5 %
MPV: 9.4 fL (ref 7.5–12.5)
Neutro Abs: 7731 cells/uL (ref 1500–7800)
Neutrophils Relative %: 85.9 %
PLATELETS: 442 10*3/uL — AB (ref 140–400)
RBC: 4.59 10*6/uL (ref 3.80–5.10)
RDW: 12.7 % (ref 11.0–15.0)
TOTAL LYMPHOCYTE: 9 %
WBC: 9 10*3/uL (ref 3.8–10.8)

## 2018-02-27 LAB — COMPREHENSIVE METABOLIC PANEL
AG RATIO: 1.5 (calc) (ref 1.0–2.5)
ALBUMIN MSPROF: 3.8 g/dL (ref 3.6–5.1)
ALT: 12 U/L (ref 6–29)
AST: 15 U/L (ref 10–35)
Alkaline phosphatase (APISO): 60 U/L (ref 33–130)
BILIRUBIN TOTAL: 0.6 mg/dL (ref 0.2–1.2)
BUN / CREAT RATIO: 13 (calc) (ref 6–22)
BUN: 13 mg/dL (ref 7–25)
CALCIUM: 9.3 mg/dL (ref 8.6–10.4)
CO2: 27 mmol/L (ref 20–32)
Chloride: 100 mmol/L (ref 98–110)
Creat: 1.02 mg/dL — ABNORMAL HIGH (ref 0.60–0.88)
GLUCOSE: 132 mg/dL — AB (ref 65–99)
Globulin: 2.5 g/dL (calc) (ref 1.9–3.7)
POTASSIUM: 4.5 mmol/L (ref 3.5–5.3)
SODIUM: 136 mmol/L (ref 135–146)
TOTAL PROTEIN: 6.3 g/dL (ref 6.1–8.1)

## 2018-02-27 LAB — LIPID PANEL
CHOL/HDL RATIO: 2.1 (calc) (ref <5.0)
CHOLESTEROL: 187 mg/dL (ref <200)
HDL: 87 mg/dL (ref >50)
LDL CHOLESTEROL (CALC): 84 mg/dL
Non-HDL Cholesterol (Calc): 100 mg/dL (calc) (ref <130)
Triglycerides: 74 mg/dL (ref <150)

## 2018-02-27 LAB — T4, FREE: Free T4: 1.2 ng/dL (ref 0.8–1.8)

## 2018-02-27 LAB — TSH: TSH: 7.69 mIU/L — ABNORMAL HIGH (ref 0.40–4.50)

## 2018-03-01 ENCOUNTER — Other Ambulatory Visit: Payer: Self-pay | Admitting: *Deleted

## 2018-03-01 DIAGNOSIS — E039 Hypothyroidism, unspecified: Secondary | ICD-10-CM

## 2018-03-01 MED ORDER — LEVOTHYROXINE SODIUM 75 MCG PO TABS: ORAL_TABLET | ORAL | 1 refills | Status: DC

## 2018-03-09 ENCOUNTER — Ambulatory Visit (INDEPENDENT_AMBULATORY_CARE_PROVIDER_SITE_OTHER): Payer: Medicare Other | Admitting: Family Medicine

## 2018-03-09 ENCOUNTER — Encounter: Payer: Self-pay | Admitting: Family Medicine

## 2018-03-09 VITALS — BP 130/82 | HR 62 | Temp 98.0°F | Resp 14 | Ht 63.0 in | Wt 150.0 lb

## 2018-03-09 DIAGNOSIS — L509 Urticaria, unspecified: Secondary | ICD-10-CM | POA: Diagnosis not present

## 2018-03-09 DIAGNOSIS — F411 Generalized anxiety disorder: Secondary | ICD-10-CM

## 2018-03-09 DIAGNOSIS — R0789 Other chest pain: Secondary | ICD-10-CM

## 2018-03-09 DIAGNOSIS — E039 Hypothyroidism, unspecified: Secondary | ICD-10-CM

## 2018-03-09 DIAGNOSIS — G4700 Insomnia, unspecified: Secondary | ICD-10-CM | POA: Diagnosis not present

## 2018-03-09 DIAGNOSIS — I10 Essential (primary) hypertension: Secondary | ICD-10-CM

## 2018-03-09 DIAGNOSIS — R21 Rash and other nonspecific skin eruption: Secondary | ICD-10-CM

## 2018-03-09 MED ORDER — HYDROXYZINE HCL 10 MG PO TABS: 10.0000 mg | ORAL_TABLET | Freq: Three times a day (TID) | ORAL | 1 refills | Status: DC | PRN

## 2018-03-09 NOTE — Assessment & Plan Note (Signed)
Pt now on 75 mcg, she is concerned about SE.   6 week recheck from last dose change puts labs at about Apr 10, 2017

## 2018-03-09 NOTE — Assessment & Plan Note (Signed)
Patient well controlled on metoprolol and Maxide She does state that she is frequently noncompliant with Maxide and skips it because of urinary frequency Consider separating medication so she can hold diuretic and continue triamterene

## 2018-03-09 NOTE — Progress Notes (Signed)
Patient ID: Patricia Morton, female    DOB: 03/11/1927, 84 y.o.   MRN: [id number]  PCP: Delsa Grana, PA-C  Chief Complaint  Patient presents with  . Follow-up  . Breaking out in hives again    Subjective:   Patricia Morton is a 84 y.o. female, presents to clinic with CC of recurrent hives and f/up for anxiety meds and labs.    About 1 month ago patient recently establish care here at this office, she had multiple complaints at that time, she had recently been admitted to the hospital for chest pain which was thought to possibly be from anxiety.  Her medication was changed from Paxil to citalopram which she was doing well with.  After seeing myself on 02/07/2018 she followed up with Dr. Buelah Manis on 02/26/2018 and citalopram was discontinued because of possible drug reaction with pruritic rash, Dr. Buelah Manis started her on Lexapro 5 mg to take at night.  Patient and her daughter who is in the room with her both state that her anxiety overall has been much better with both of these medications and she did not have any difficulty going from citalopram to Lexapro.  The patient would like to increase the dose from 5 to 10 mg stating that it helps her go to sleep but she cannot stay asleep past 3 AM.  Patient has also continued to have recurrence of red raised pruritic rash located all over her body.  She had hives earlier this morning but they have since resolved and she only has a few areas which she has scratched.  She states that the only thing that seems to make the rash better is when she is on prednisone and then when prednisone is completed the rash returns.  She states that she finished a prednisone taper that she got from Dr. Buelah Manis, finished 5 days ago and last night the rash reoccurred as well.  She continues to deny any associated lip swelling, difficulty speaking breathing swallowing when the rash is at its worst.   Prednisone gets the itchy rash to go away     In reviewing Ms. Butz's chart her  thyroid levels were drawn on 10/19/2017 TSH was noted to be 0.215 when taking dose of 75 mcg.    Dr. Buelah Manis ordered labs on 02/26/18, TSH was 7.69 and free T4 was normal when pt was taking 62.5 (50 mcg + 1/2 25 mcg).   The pt today continues to endorse feeling very lightheaded shortly after taking her Synthroid medicine in the morning on empty stomach also having some facial numbness and the symptoms resolve after she lays down for an hour or so and she does not have any other symptoms throughout the day.  She has been referred to endocrinology.  She reports having difficulty managing her thyroid levels for the past 30 years.    Patient Active Problem List   Diagnosis Date Noted  . Osteoarthritis, generalized 02/09/2018  . Pure hypercholesterolemia 02/09/2018  . Insomnia 02/09/2018  . Depression, controlled 02/09/2018  . Cyst of right kidney 02/09/2018  . Benign essential hypertension 02/09/2018  . Anxiety disorder, unspecified 02/09/2018  . Acquired hypothyroidism 02/09/2018  . Atypical chest pain 02/09/2018  . Coronary artery disease involving native heart 02/09/2018  . OA (osteoarthritis) of knee 05/25/2015     Prior to Admission medications   Medication Sig Start Date End Date Taking? Authorizing Provider  Calcium-Magnesium-Vitamin D (CALCIUM 500 PO) Take by mouth.   Yes [provider]  escitalopram (LEXAPRO) 5 MG tablet Take 1 tablet (5 mg total) by mouth at bedtime. 02/26/18  Yes [place], Modena Nunnery, MD  famotidine (PEPCID) 20 MG tablet Take 1 tablet (20 mg total) by mouth 2 (two) times daily. For Hives 02/26/18  Yes [place], Modena Nunnery, MD  levothyroxine (SYNTHROID, LEVOTHROID) 75 MCG tablet 1 tab po Q AM 03/01/18  Yes [place], Modena Nunnery, MD  metoprolol (LOPRESSOR) 50 MG tablet Take 50 mg by mouth daily.    Yes [provider]  simvastatin (ZOCOR) 40 MG tablet Take 40 mg by mouth at bedtime.   Yes [provider]  traZODone (DESYREL) 50 MG tablet Take 50 mg  by mouth at bedtime.   Yes [provider]  triamterene-hydrochlorothiazide (MAXZIDE-25) 37.5-25 MG tablet Take 0.5 tablets by mouth daily.   Yes [provider]  citalopram (CELEXA) 10 MG tablet TAKE 1 TABLET BY MOUTH EVERY DAY FOR ANXIETY/DEPRESSION 01/15/18   [provider]     Allergies  Allergen Reactions  . Celexa [Citalopram Hydrobromide] Hives     Family History  Problem Relation Age of Onset  . Heart disease Brother         Review of Systems  Constitutional: Negative.   HENT: Negative.   Eyes: Negative.   Respiratory: Negative.   Cardiovascular: Negative.   Gastrointestinal: Negative.   Endocrine: Negative.   Genitourinary: Negative.   Musculoskeletal: Negative.   Skin: Negative.   Allergic/Immunologic: Negative.   Neurological: Negative.   Hematological: Negative.   Psychiatric/Behavioral: Negative.   All other systems reviewed and are negative.      Objective:    Vitals:   03/09/18 1059  BP: 130/82  Pulse: 62  Resp: 14  Temp: 98 F (36.7 C)  TempSrc: Oral  SpO2: 98%  Weight: 150 lb (68 kg)  Height: 5\' 3"  (1.6 m)      Physical Exam Vitals signs and nursing note reviewed.  Constitutional:      General: She is not in acute distress.    Appearance: Normal appearance. She is well-developed. She is not ill-appearing, toxic-appearing or diaphoretic.     Comments: Elderly female, well appearing  HENT:     Head: Normocephalic and atraumatic.     Comments: Normal mouth, lips cheeks No stridor, normal phonation    Right Ear: External ear normal.     Left Ear: External ear normal.     Nose: Nose normal. No congestion or rhinorrhea.     Mouth/Throat:     Lips: No lesions.     Mouth: Mucous membranes are moist. No injury, oral lesions or angioedema.     Tongue: No lesions.     Palate: No mass.     Pharynx: Oropharynx is clear. Uvula midline. No pharyngeal swelling, oropharyngeal exudate, posterior oropharyngeal  erythema or uvula swelling.     Tonsils: No tonsillar exudate.  Eyes:     General: Lids are normal. No scleral icterus.    Extraocular Movements: Extraocular movements intact.     Conjunctiva/sclera: Conjunctivae normal.     Right eye: Right conjunctiva is not injected. No chemosis, exudate or hemorrhage.    Left eye: Left conjunctiva is not injected. No chemosis, exudate or hemorrhage.    Pupils: Pupils are equal, round, and reactive to light.  Neck:     Musculoskeletal: Normal range of motion and neck supple.     Trachea: Phonation normal. No tracheal deviation.  Cardiovascular:     Rate and Rhythm: Normal rate  and regular rhythm.     Pulses: Normal pulses.          Radial pulses are 2+ on the right side and 2+ on the left side.       Posterior tibial pulses are 2+ on the right side and 2+ on the left side.     Heart sounds: Normal heart sounds. No murmur. No friction rub. No gallop.      Comments: No LE edema Pulmonary:     Effort: Pulmonary effort is normal. No respiratory distress.     Breath sounds: Normal breath sounds. No stridor. No wheezing, rhonchi or rales.  Chest:     Chest wall: No tenderness.  Abdominal:     General: Bowel sounds are normal. There is no distension.     Palpations: Abdomen is soft.     Tenderness: There is no abdominal tenderness.     Comments: b/l  Musculoskeletal: Normal range of motion.        General: No deformity.  Lymphadenopathy:     Cervical: No cervical adenopathy.  Skin:    General: Skin is warm and dry.     Capillary Refill: Capillary refill takes less than 2 seconds.     Coloration: Skin is not pale.     Findings: Rash present.     Comments: Scattered 0.5-1 cm oval maculopapular erythematous rash to arms, legs, back, face (only 1-5 per area) Dry patchy excoriations to low back No hives visible on exam  Neurological:     Mental Status: She is alert.     Motor: No abnormal muscle tone.     Gait: Gait normal.  Psychiatric:         Speech: Speech normal.        Behavior: Behavior normal.           Assessment & Plan:    Problem List Items Addressed This Visit      Cardiovascular and Mediastinum   Benign essential hypertension    Patient well controlled on metoprolol and Maxide She does state that she is frequently noncompliant with Maxide and skips it because of urinary frequency Consider separating medication so she can hold diuretic and continue triamterene        Endocrine   Acquired hypothyroidism    Pt now on 75 mcg, she is concerned about SE.   6 week recheck from last dose change puts labs at about Apr 10, 2017  Last labs available in care everywhere show TSH 0.2 in August when pt was last on 75 mcg Then on 62.5 TSH was high        Other   Insomnia    Insomnia persists, able to get to sleep easily, but wakes at 3 am Taking lexapro 5 mg at night Increase to 10 mg and pt and daughter to contact me Jan 2 or 3rd to report effectiveness/SE - would need 10 mg Rx instead of 5 mg if she tolerates well.    I have explained that the medicine is primarily for anxiety and depression and mood and does not treat insomnia specifically and she may need an extended release sleep aid if she continues to have difficulty      Anxiety disorder, unspecified   Relevant Medications   lexapro - increase dose to 10 mg PO daily at bedtime, follow up in 1 week   Atypical chest pain    No recurrent chest pain, tachycardia, palpitations, shortness of breath, near syncope  Other Visit Diagnoses    Rash and nonspecific skin eruption    -  Primary   some rash and excoriated patches to back, hydrate dry excoriated areas, zytec, PRN atarax 10 mg   Hives       intermittent hives for months, does not seem related to celexa, lexapro or keflex.  Tx with daily 24 hour antihistamine first and then atarax 10-20 mg TID PRN Discussed SE of antihistamines, use sparingly Will avoid more steroids for now   Relevant Orders    Ambulatory referral to Allergy             Delsa Grana, PA-C 03/09/18 11:12 AM

## 2018-03-09 NOTE — Assessment & Plan Note (Signed)
No recurrent chest pain, tachycardia, palpitations, shortness of breath, near syncope

## 2018-03-09 NOTE — Assessment & Plan Note (Signed)
Insomnia persists, able to get to sleep easily, but wakes at 3 am Taking lexapro 5 mg at night Increase to 10 mg and pt and daughter to contact me Jan 2 or 3rd to report effectiveness/SE - would need 10 mg Rx instead of 5 mg if she tolerates well.    I have explained that the medicine is primarily for anxiety and depression and mood and does not treat insomnia specifically and she may need an extended release sleep aid if she continues to have difficulty

## 2018-03-09 NOTE — Patient Instructions (Addendum)
Use a daily 2nd generation antihistamine like zyrtec, claritin or allegra to help try and control the hives.  Hydrate dry and patchy skin to try and prevent it from causing extra irritation.  Use the hydroxyzine as needed for worsening rash or hives.  I will refer you to an allergist to help work this up and see if we can find a trigger   Anxiety/sleep  Starting Monday - taking 2 lexapro pills at night (10 mg) and do this all week and let me know thursdays or Friday so I can refill the medicine at the higher dose before you are out.  Call and leave a message for how you felt the medicines helped at the higher dose - call January 2 or Jan 3.    Come by to get Thyroid labs and recheck with me the last week of January

## 2018-03-16 ENCOUNTER — Telehealth: Payer: Self-pay | Admitting: Family Medicine

## 2018-03-16 NOTE — Telephone Encounter (Signed)
Patient called in today stating that the Lexapro 10 mg is working well  and she would like a refill sent in to her pharmacy in [place].

## 2018-03-16 NOTE — Telephone Encounter (Signed)
Yes can you refill with 90 day supply and 1 refill

## 2018-03-19 ENCOUNTER — Telehealth: Payer: Self-pay | Admitting: Family Medicine

## 2018-03-19 DIAGNOSIS — L509 Urticaria, unspecified: Secondary | ICD-10-CM

## 2018-03-19 MED ORDER — ESCITALOPRAM OXALATE 10 MG PO TABS: 10.0000 mg | ORAL_TABLET | Freq: Every day | ORAL | 1 refills | Status: DC

## 2018-03-19 NOTE — Telephone Encounter (Signed)
90 day supply sent into patient's pharmacy

## 2018-03-19 NOTE — Telephone Encounter (Signed)
Patient's daughter called back in today in regards to patient's Hives and Lexapro. She states that the increase dose of Lexapro is working but [employer] are still occurring and she was wondering if the Hives could be coming from [place]. Please advise? She does not see an allergist until the end of this month.

## 2018-03-20 NOTE — Telephone Encounter (Signed)
Patient's daughter called back in stating that patient's Hives have gotten worse today and she wanted to know if she needs another round of prednisone. She is not scheduled to see Allergist until the end of the month. Please advise?

## 2018-03-21 ENCOUNTER — Ambulatory Visit: Payer: Medicare Other | Admitting: Internal Medicine

## 2018-03-21 MED ORDER — PREDNISONE 20 MG PO TABS: ORAL_TABLET | ORAL | 0 refills | Status: DC

## 2018-03-21 MED ORDER — HYDROXYZINE HCL 10 MG PO TABS: 10.0000 mg | ORAL_TABLET | Freq: Three times a day (TID) | ORAL | 1 refills | Status: DC | PRN

## 2018-03-21 MED ORDER — MONTELUKAST SODIUM 10 MG PO TABS: 10.0000 mg | ORAL_TABLET | Freq: Every day | ORAL | 2 refills | Status: DC

## 2018-03-21 MED ORDER — FAMOTIDINE 20 MG PO TABS: 20.0000 mg | ORAL_TABLET | Freq: Two times a day (BID) | ORAL | 0 refills | Status: DC

## 2018-03-21 NOTE — Telephone Encounter (Signed)
Please call pt (or pt daughter back)  I am very sorry for the delay responding.   I doubt that the Lexapro would be causing hives if it was previously suspected that the citalopram was causing hives there may be another reason.  Please check bedding and ensure that there is no mites.  Sure you are doing mild soaps detergents and no lotions with fragrances.  Before using steroids again I would try the hydroxyzine medication which is an antihistamine which helps chronic idiopathic hives.  I would also continue the Pepcid as another histamine blocking medication and is very safe.  Can try this and see if it improves if it does not I would add Singulair which is a another allergy blocking medication commonly people are on this of they have a lot of allergies sensitivities or rashes.  I would try those 3 things and continue the Lexapro for mood before starting steroids but I did send a steroid prescription through with 40 mg for 3 days 20 mg for 3 days and then 10 mg for 4 days.    If we cannot get the rash to go away and it does not appear to have any other source like bedbugs mites scabies etc.  We may need to try and stop the Lexapro but I would want to stop all of the medications in the same class at the same time and we would need to slowly decrease the Lexapro dose because mostly will feel very awful coming off of Lexapro.  Like to do everything we can to avoid this.    When she goes to the allergist they may be able to help find the cause of the rash or treat the rash.  We could also refer her to dermatology.

## 2018-03-21 NOTE — Telephone Encounter (Signed)
Patient's daughter called back stating that patient still has hives has tried Benadryl with no relief. Would like to know what else you recommend. Please advise

## 2018-03-22 NOTE — Telephone Encounter (Signed)
Spoke with patient's daughter and informed her of recommendations by [facility] and medication that were sent in. Patient's daughter verbalized understanding. Referral to dermatology to be ordered.

## 2018-03-22 NOTE — Addendum Note (Signed)
Addended by: Launa Grill on: 03/22/2018 08:19 AM   Modules accepted: Orders

## 2018-03-26 ENCOUNTER — Ambulatory Visit: Payer: Medicare Other | Admitting: Internal Medicine

## 2018-03-26 ENCOUNTER — Other Ambulatory Visit: Payer: Self-pay

## 2018-03-26 DIAGNOSIS — L820 Inflamed seborrheic keratosis: Secondary | ICD-10-CM | POA: Diagnosis not present

## 2018-03-26 DIAGNOSIS — L508 Other urticaria: Secondary | ICD-10-CM | POA: Diagnosis not present

## 2018-03-26 MED ORDER — SIMVASTATIN 40 MG PO TABS: 40.0000 mg | ORAL_TABLET | Freq: Every day | ORAL | 0 refills | Status: DC

## 2018-03-28 ENCOUNTER — Encounter: Payer: Self-pay | Admitting: Allergy

## 2018-03-28 ENCOUNTER — Ambulatory Visit: Payer: Medicare Other | Admitting: Allergy

## 2018-03-28 VITALS — BP 124/60 | HR 64 | Temp 98.1°F | Resp 16 | Ht 62.5 in | Wt 150.0 lb

## 2018-03-28 DIAGNOSIS — L509 Urticaria, unspecified: Secondary | ICD-10-CM | POA: Diagnosis not present

## 2018-03-28 MED ORDER — CETIRIZINE HCL 10 MG PO TABS: 10.0000 mg | ORAL_TABLET | Freq: Every day | ORAL | 5 refills | Status: DC

## 2018-03-28 NOTE — Assessment & Plan Note (Signed)
3 episodes of rash/hives flare up since November 2019. Initially it was thought to be due from her medication change to Celexa however had 2 flares since stopping Celexa. The prednisone and antihistamines do seem to help. Other than the above medication changes denies any changes in diet, personal care products, recent infections. She did have bloodwork which showed elevated TSH level and her synthroid dose was adjusted.   Discussed with patient that given her clinical history it is difficult to pinpoint what may have exactly triggered these hives. There is a possibility that the Celexa was a trigger so recommend that she continues avoidance however sometimes uncontrolled thyroid issues can cause hives as well.   Start zyrtec 10mg  daily. Monitor symptoms. If it worsens let me know.   No further work up is needed at this time.   Follow up with your endocrinologist as scheduled.

## 2018-03-28 NOTE — Patient Instructions (Addendum)
Start zyrtec 10mg  daily. Monitor symptoms. If it worsens let me know.   Follow up with your endocrinologist as scheduled. Follow up in 2 months

## 2018-03-28 NOTE — Progress Notes (Signed)
New Patient Note  RE: Patricia Morton MRN: [id number] DOB: Jul 27, 1926 Date of Office Visit: 03/28/2018  Referring provider: Delsa Grana, PA-C Primary care provider: Delsa Grana, PA-C  Chief Complaint: Urticaria  History of Present Illness: I had the pleasure of seeing Patricia Morton for initial evaluation at the [facility] on 03/28/2018. She is a 85 y.o. female, who is referred here by Delsa Grana, PA-C for the evaluation of hives. She is accompanied today by her mother who provided/contributed to the history.   Rash started about 1 week after her hospitalization in November 2019. She was hospitalized for a panic attack. She was started on Celexa in the hospital which was a change from trazodone. The rash can occur anywhere on her body. The Celexa was weaned off starting on 12/16 and currently on Lexapro. She was placed on prednisone during her initial flare and had another flare while on Lexapro. The prednisone did help. Last episode was 2 weeks ago and this time only took benadryl, hydroxyzine and Pepcid. She did not require prednisone and the hives resolved. No outbreaks since then. She had 3 total outbreaks.   Describes them as red, itchy and raised. Individual rashes lasts about a few days. No ecchymosis upon resolution. Associated symptoms include: no respiratory compromise. Suspected triggers are unknown. Denies any fevers, chills, foods, personal care products or recent infections. She has tried the following therapies: antihistamines with good benefit. Systemic steroids yes 2 courses. Currently on no medications.  Previous work up includes: CBC dif, cmp and lipid panel unremarkable however her TSH was abnormal. She had hypothyroidism for quite some time and it seems to be very labile.  Previous history of rash/hives: no. Patient is up to date with the following cancer screening tests: colonoscopy,, mammogram, pap smears. Reviewed images on the phone which showed raised  red lesions on the abdomen and posterior neck area.   Assessment and Plan: Patricia Morton is a 85 y.o. female with: Urticaria 3 episodes of rash/hives flare up since November 2019. Initially it was thought to be due from her medication change to Celexa however had 2 flares since stopping Celexa. The prednisone and antihistamines do seem to help. Other than the above medication changes denies any changes in diet, personal care products, recent infections. She did have bloodwork which showed elevated TSH level and her synthroid dose was adjusted.   Discussed with patient that given her clinical history it is difficult to pinpoint what may have exactly triggered these hives. There is a possibility that the Celexa was a trigger so recommend that she continues avoidance however sometimes uncontrolled thyroid issues can cause hives as well.   Start zyrtec 10mg  daily. Monitor symptoms. If it worsens let me know.   No further work up is needed at this time.   Follow up with your endocrinologist as scheduled.  Return in about 2 months (around 05/27/2018).  Meds ordered this encounter  Medications  . cetirizine (ZYRTEC) 10 MG tablet    Sig: Take 1 tablet (10 mg total) by mouth daily.    Dispense:  30 tablet    Refill:  5   Other allergy screening: Asthma: no Rhino conjunctivitis: no Food allergy: no Hymenoptera allergy: no Eczema:no History of recurrent infections suggestive of immunodeficency: no  Diagnostics: None.  Past Medical History: Patient Active Problem List   Diagnosis Date Noted  . Urticaria 03/28/2018  . Osteoarthritis, generalized 02/09/2018  . Pure hypercholesterolemia 02/09/2018  . Insomnia 02/09/2018  .  Depression, controlled 02/09/2018  . Cyst of right kidney 02/09/2018  . Benign essential hypertension 02/09/2018  . Anxiety disorder, unspecified 02/09/2018  . Acquired hypothyroidism 02/09/2018  . Atypical chest pain 02/09/2018  . Coronary artery disease involving native  heart 02/09/2018  . OA (osteoarthritis) of knee 05/25/2015   Past Medical History:  Diagnosis Date  . Anxiety   . Arthritis   . Cataract    surgically removed  . Hyperlipemia   . Hypertension   . Hypothyroid   . Urticaria    Past Surgical History: Past Surgical History:  Procedure Laterality Date  . ABDOMINAL HYSTERECTOMY    . BREAST SURGERY     right breast -lumpectomy-benign  . CYSTECTOMY    . EYE SURGERY    . TOTAL KNEE ARTHROPLASTY Right 05/25/2015   Procedure: TOTAL KNEE ARTHROPLASTY;  Surgeon: Gaynelle Arabian, MD;  Location: [facility];  Service: Orthopedics;  Laterality: Right;  . TOTAL KNEE ARTHROPLASTY Left 12/14/2015   Procedure: LEFT TOTAL KNEE ARTHROPLASTY;  Surgeon: Gaynelle Arabian, MD;  Location: [facility];  Service: Orthopedics;  Laterality: Left;   Medication List:  Current Outpatient Medications  Medication Sig Dispense Refill  . Calcium-Magnesium-Vitamin D (CALCIUM 500 PO) Take by mouth.    . escitalopram (LEXAPRO) 10 MG tablet Take 1 tablet (10 mg total) by mouth daily. 90 tablet 1  . famotidine (PEPCID) 20 MG tablet Take 1 tablet (20 mg total) by mouth 2 (two) times daily. For Hives 20 tablet 0  . hydrOXYzine (ATARAX/VISTARIL) 10 MG tablet Take 1-2 tablets (10-20 mg total) by mouth 3 (three) times daily as needed for itching. 120 tablet 1  . levothyroxine (SYNTHROID, LEVOTHROID) 75 MCG tablet 1 tab po Q AM 30 tablet 1  . metoprolol (LOPRESSOR) 50 MG tablet Take 50 mg by mouth daily.     . montelukast (SINGULAIR) 10 MG tablet Take 1 tablet (10 mg total) by mouth at bedtime. 30 tablet 2  . simvastatin (ZOCOR) 40 MG tablet Take 1 tablet (40 mg total) by mouth at bedtime. 90 tablet 0  . triamterene-hydrochlorothiazide (MAXZIDE-25) 37.5-25 MG tablet Take 0.5 tablets by mouth daily.    . cetirizine (ZYRTEC) 10 MG tablet Take 1 tablet (10 mg total) by mouth daily. 30 tablet 5   No current facility-administered medications for this visit.    Allergies: Allergies  Allergen  Reactions  . Celexa [Citalopram Hydrobromide] Hives   Social History: Social History   Socioeconomic History  . Marital status: Widowed    Spouse name: Not on file  . Number of children: Not on file  . Years of education: Not on file  . Highest education level: Not on file  Occupational History  . Not on file  Social Needs  . Financial resource strain: Not on file  . Food insecurity:    Worry: Not on file    Inability: Not on file  . Transportation needs:    Medical: Not on file    Non-medical: Not on file  Tobacco Use  . Smoking status: Former Smoker    Years: 15.00    Types: Cigarettes    Start date: 1980    Last attempt to quit: 2004    Years since quitting: 16.0  . Smokeless tobacco: Never Used  Substance and Sexual Activity  . Alcohol use: Not Currently    Comment: wine occassionally  . Drug use: No  . Sexual activity: Not Currently  Lifestyle  . Physical activity:    Days per week: Not  on file    Minutes per session: Not on file  . Stress: Not on file  Relationships  . Social connections:    Talks on phone: Not on file    Gets together: Not on file    Attends religious service: Not on file    Active member of club or organization: Not on file    Attends meetings of clubs or organizations: Not on file    Relationship status: Not on file  Other Topics Concern  . Not on file  Social History Narrative  . Not on file   Lives in a home. Smoking: denies Occupation: retired  Programme researcher, broadcasting/film/video HistoryFreight forwarder in the house: yes Carpet in the family room: yes Carpet in the bedroom: no Heating: gas Cooling: central Pet: yes 1 dog  Family History: Family History  Problem Relation Age of Onset  . Heart disease Brother   . Allergic rhinitis Neg Hx   . Asthma Neg Hx   . Eczema Neg Hx   . Urticaria Neg Hx    Review of Systems  Constitutional: Negative for appetite change, chills, fever and unexpected weight change.  HENT: Negative for  congestion and rhinorrhea.   Eyes: Negative for itching.  Respiratory: Negative for cough, chest tightness, shortness of breath and wheezing.   Cardiovascular: Negative for chest pain.  Gastrointestinal: Negative for abdominal pain.  Genitourinary: Negative for difficulty urinating.  Skin: Positive for rash.  Neurological: Negative for headaches.   Objective: BP 124/60 (BP Location: Right Arm, Patient Position: Sitting, Cuff Size: Normal)   Pulse 64   Temp 98.1 F (36.7 C) (Oral)   Resp 16   Ht 5' 2.5" (1.588 m)   Wt 150 lb (68 kg)   SpO2 96%   BMI 27.00 kg/m  Body mass index is 27 kg/m. Physical Exam  Constitutional: She is oriented to person, place, and time. She appears well-developed and well-nourished.  HENT:  Head: Normocephalic and atraumatic.  Right Ear: External ear normal.  Left Ear: External ear normal.  Nose: Nose normal.  Mouth/Throat: Oropharynx is clear and moist.  Eyes: Conjunctivae and EOM are normal.  Neck: Neck supple.  Cardiovascular: Normal rate, regular rhythm and normal heart sounds. Exam reveals no gallop and no friction rub.  No murmur heard. Pulmonary/Chest: Effort normal and breath sounds normal. She has no wheezes. She has no rales.  Abdominal: Soft.  Lymphadenopathy:    She has no cervical adenopathy.  Neurological: She is alert and oriented to person, place, and time.  Skin: Skin is warm. No rash noted.  Psychiatric: She has a normal mood and affect. Her behavior is normal.  Nursing note and vitals reviewed.  The plan was reviewed with the patient/family, and all questions/concerned were addressed.  It was my pleasure to see Kaedynce today and participate in her care. Please feel free to contact me with any questions or concerns.  Sincerely,  Rexene Alberts, DO Allergy & Immunology  [facility] office: [phone] [facility] office: [phone]

## 2018-04-05 ENCOUNTER — Ambulatory Visit: Payer: Medicare Other | Admitting: Allergy

## 2018-04-06 ENCOUNTER — Other Ambulatory Visit: Payer: Self-pay | Admitting: Family Medicine

## 2018-04-06 ENCOUNTER — Ambulatory Visit: Payer: Medicare Other | Admitting: Internal Medicine

## 2018-04-06 DIAGNOSIS — E039 Hypothyroidism, unspecified: Secondary | ICD-10-CM

## 2018-04-10 ENCOUNTER — Ambulatory Visit: Payer: Medicare Other | Admitting: Family Medicine

## 2018-04-11 ENCOUNTER — Encounter: Payer: Self-pay | Admitting: Family Medicine

## 2018-04-11 ENCOUNTER — Ambulatory Visit: Payer: Medicare Other | Admitting: Family Medicine

## 2018-04-11 VITALS — BP 124/72 | HR 67 | Temp 98.6°F | Ht 63.0 in | Wt 151.4 lb

## 2018-04-11 DIAGNOSIS — F411 Generalized anxiety disorder: Secondary | ICD-10-CM | POA: Diagnosis not present

## 2018-04-11 DIAGNOSIS — L509 Urticaria, unspecified: Secondary | ICD-10-CM

## 2018-04-11 DIAGNOSIS — E039 Hypothyroidism, unspecified: Secondary | ICD-10-CM | POA: Diagnosis not present

## 2018-04-11 LAB — TSH: TSH: 1.92 mIU/L (ref 0.40–4.50)

## 2018-04-11 LAB — T4, FREE: Free T4: 1.5 ng/dL (ref 0.8–1.8)

## 2018-04-11 NOTE — Progress Notes (Signed)
Patient ID: Patricia Morton, female    DOB: [birth year], 85 y.o.   MRN: [id number]  PCP: Delsa Grana, PA-C  Chief Complaint  Patient presents with  . Rash    Patient in with c/o hives, most recent breakout was today.    Subjective:   PENELOPI Morton is a 85 y.o. female, presents to clinic with CC of continued intermittent hives and here for follow-up of thyroid.  Patient has been to dermatology and to allergy specialist but she does return here with hives that occurred Sunday, 3 days ago and improved with hydroxyzine and then reoccurred this morning, she did not take any medicine so we could see her hives.  Patient and her daughter here today state that dermatology did not know the cause but were not concerned for eczema and it did not appear consistent with any type of mites or bedbugs.   She was encouraged to follow-up with the allergist if her rash returned, the time of visit and had resolved.  Have not contacted the allergist.  They do have a routine follow-up in March. Patient states that she will take 10 to 20 mg of hydroxyzine usually only once if she has the hives return.  Patient and her daughter state that seems to happen at either residence, they cannot find any foods soaps detergent triggers.  Overall since last time I saw her 6 weeks ago with use of Pepcid and hydroxyzine when they do take it it does seem better.  She has had less days with hives reoccurring.  She is currently now compliant with taking Singulair and antihistamine daily. She has a few spots on her face to the back of her hairline and scattered to her torso and upper extremities.  Continues to ask if she has eczema or why she has some dry patches but she also states that she scratches her back with her hairbrush.  She has been using cerave cream, and dry patches previously located to her back have resolved.  Only a few scratches today, none are near rash or patches.  Rash/hives continues to have no associated lip swelling,  swelling around eyes, sensation of throat closure, wheeze, shortness of breath, nausea, vomiting, headaches  Patient has been taking Synthroid 75 mcg every morning taking on empty stomach and then taking rest of her medicines 45 minutes to an hour later.  She continues to endorse the funny side effects that occur immediately after taking her Synthroid is described as some fluttering feeling dizziness which is brief lasts usually less than 30 to 45 minutes and does not recur the rest of the day.  Today she states that her side effects feel the same with taking 75 mcg a did with taking the slightly lower dose of 62.5 mcg. She has appt with endocrinologist in the next week.  Lexapro 10 mg is still working well, no SE from it.      Patient Active Problem List   Diagnosis Date Noted  . Urticaria 03/28/2018  . Osteoarthritis, generalized 02/09/2018  . Pure hypercholesterolemia 02/09/2018  . Insomnia 02/09/2018  . Depression, controlled 02/09/2018  . Cyst of right kidney 02/09/2018  . Benign essential hypertension 02/09/2018  . Anxiety disorder, unspecified 02/09/2018  . Acquired hypothyroidism 02/09/2018  . Atypical chest pain 02/09/2018  . Coronary artery disease involving native heart 02/09/2018  . OA (osteoarthritis) of knee 05/25/2015     Prior to Admission medications   Medication Sig Start Date End Date Taking? Authorizing Provider  cetirizine (ZYRTEC) 10 MG tablet Take 1 tablet (10 mg total) by mouth daily. 03/28/18  Yes Garnet Sierras, DO  escitalopram (LEXAPRO) 10 MG tablet Take 1 tablet (10 mg total) by mouth daily. 03/19/18  Yes Delsa Grana, PA-C  famotidine (PEPCID) 20 MG tablet Take 1 tablet (20 mg total) by mouth 2 (two) times daily. For Hives 03/21/18  Yes Delsa Grana, PA-C  hydrOXYzine (ATARAX/VISTARIL) 10 MG tablet Take 1-2 tablets (10-20 mg total) by mouth 3 (three) times daily as needed for itching. 03/21/18  Yes Delsa Grana, PA-C  levothyroxine (SYNTHROID, LEVOTHROID) 75 MCG  tablet TAKE 1 TABLET BY MOUTH EVERY MORNING ON an EMPTY stomach. wait 45 minutes BEFORE eating 04/06/18  Yes Delsa Grana, PA-C  metoprolol tartrate (LOPRESSOR) 50 MG tablet TAKE 1 TABLET BY MOUTH TWICE DAILY FOR BLOOD PRESSURE 04/06/18  Yes Delsa Grana, PA-C  montelukast (SINGULAIR) 10 MG tablet Take 1 tablet (10 mg total) by mouth at bedtime. 03/21/18  Yes Delsa Grana, PA-C  simvastatin (ZOCOR) 40 MG tablet TAKE 1 TABLET BY MOUTH AT BEDTIME 04/06/18  Yes Delsa Grana, PA-C  triamterene-hydrochlorothiazide (MAXZIDE-25) 37.5-25 MG tablet TAKE 1/2 TABLET BY MOUTH EVERY MORNING 04/06/18  Yes [place], Modena Nunnery, MD  Calcium-Magnesium-Vitamin D (CALCIUM 500 PO) Take by mouth.    [provider]     Allergies  Allergen Reactions  . Celexa [Citalopram Hydrobromide] Hives     Family History  Problem Relation Age of Onset  . Heart disease Brother   . Allergic rhinitis Neg Hx   . Asthma Neg Hx   . Eczema Neg Hx   . Urticaria Neg Hx      Social History   Socioeconomic History  . Marital status: Widowed    Spouse name: Not on file  . Number of children: Not on file  . Years of education: Not on file  . Highest education level: Not on file  Occupational History  . Not on file  Social Needs  . Financial resource strain: Not on file  . Food insecurity:    Worry: Not on file    Inability: Not on file  . Transportation needs:    Medical: Not on file    Non-medical: Not on file  Tobacco Use  . Smoking status: Former Smoker    Years: 15.00    Types: Cigarettes    Start date: 1980    Last attempt to quit: 2004    Years since quitting: 16.0  . Smokeless tobacco: Never Used  Substance and Sexual Activity  . Alcohol use: Not Currently    Comment: wine occassionally  . Drug use: No  . Sexual activity: Not Currently  Lifestyle  . Physical activity:    Days per week: Not on file    Minutes per session: Not on file  . Stress: Not on file  Relationships  . Social connections:      Talks on phone: Not on file    Gets together: Not on file    Attends religious service: Not on file    Active member of club or organization: Not on file    Attends meetings of clubs or organizations: Not on file    Relationship status: Not on file  . Intimate partner violence:    Fear of current or ex partner: Not on file    Emotionally abused: Not on file    Physically abused: Not on file    Forced sexual activity: Not on file  Other Topics Concern  .  Not on file  Social History Narrative  . Not on file     Review of Systems  Constitutional: Negative.   HENT: Negative.   Eyes: Negative.   Respiratory: Negative.   Cardiovascular: Negative.   Gastrointestinal: Negative.   Endocrine: Negative.   Genitourinary: Negative.   Musculoskeletal: Negative.   Skin: Negative.   Allergic/Immunologic: Negative.   Neurological: Negative.   Hematological: Negative.   Psychiatric/Behavioral: Negative.  Negative for sleep disturbance and suicidal ideas.  All other systems reviewed and are negative.      Objective:    Vitals:   04/11/18 1000  BP: 124/72  Pulse: 67  Temp: 98.6 F (37 C)  TempSrc: Oral  SpO2: 98%  Weight: 151 lb 6 oz (68.7 kg)  Height: 5\' 3"  (1.6 m)  HC: 15" (38.1 cm)      Physical Exam Vitals signs and nursing note reviewed.  Constitutional:      General: She is not in acute distress.    Appearance: She is well-developed and normal weight. She is not ill-appearing, toxic-appearing or diaphoretic.  HENT:     Head: Normocephalic and atraumatic.     Right Ear: External ear normal.     Left Ear: External ear normal.     Nose: Nose normal. No congestion or rhinorrhea.     Mouth/Throat:     Mouth: Mucous membranes are moist.  Eyes:     General:        Right eye: No discharge.        Left eye: No discharge.     Conjunctiva/sclera: Conjunctivae normal.  Neck:     Trachea: No tracheal deviation.  Cardiovascular:     Rate and Rhythm: Normal rate and  regular rhythm.  Pulmonary:     Effort: Pulmonary effort is normal. No respiratory distress.     Breath sounds: No stridor.  Musculoskeletal: Normal range of motion.  Skin:    General: Skin is warm and dry.     Findings: No rash.  Neurological:     Mental Status: She is alert.     Motor: No abnormal muscle tone.     Coordination: Coordination normal.  Psychiatric:        Behavior: Behavior normal.           Assessment & Plan:   Problem List Items Addressed This Visit      Endocrine   Acquired hypothyroidism    Recheck labs Med SE seems to be the same with 75 vs 62.5 mcg dose TSH to guide - pt following up with endocrinology next week        Musculoskeletal and Integument   Urticaria    Pt returns here for recurrent urticaria, episode Sunday, 3d ago and again today.  It responds to hydroxyzine 10-20 mg once and pepcid.  She is now taking antihistamine and Singulair daily. May be idiopathic chronic urticaria -I explained to her that she may need to take the hydroxyzine more times a day as needed or increase the dose as needed but I have been trying to avoid medication side effects and polypharmacy due to her age. Overall it seems to be better Encouraged her to follow-up with the allergist who is going to continue to manage this and work it up. No steroids -follow-up with allergist        Other   Anxiety disorder, unspecified    Currently well controlled with Lexapro 10 mg no side effects from the medication  Other Visit Diagnoses    Hypothyroidism, unspecified type    -  Primary   Relevant Orders   T4, Free   TSH         Delsa Grana, PA-C 04/11/18 10:06 AM

## 2018-04-11 NOTE — Assessment & Plan Note (Signed)
Currently well controlled with Lexapro 10 mg no side effects from the medication

## 2018-04-11 NOTE — Assessment & Plan Note (Signed)
Recheck labs Med SE seems to be the same with 75 vs 62.5 mcg dose TSH to guide - pt following up with endocrinology next week

## 2018-04-11 NOTE — Assessment & Plan Note (Signed)
Pt returns here for recurrent urticaria, episode Sunday, 3d ago and again today.  It responds to hydroxyzine 10-20 mg once and pepcid.  She is now taking antihistamine and Singulair daily. May be idiopathic chronic urticaria -I explained to her that she may need to take the hydroxyzine more times a day as needed or increase the dose as needed but I have been trying to avoid medication side effects and polypharmacy due to her age. Overall it seems to be better Encouraged her to follow-up with the allergist who is going to continue to manage this and work it up. No steroids -follow-up with allergist

## 2018-04-12 ENCOUNTER — Other Ambulatory Visit: Payer: Self-pay | Admitting: Family Medicine

## 2018-04-12 DIAGNOSIS — E039 Hypothyroidism, unspecified: Secondary | ICD-10-CM

## 2018-04-18 ENCOUNTER — Encounter: Payer: Self-pay | Admitting: Allergy

## 2018-04-18 ENCOUNTER — Ambulatory Visit: Payer: Medicare Other | Admitting: Allergy

## 2018-04-18 VITALS — BP 106/62 | HR 64 | Resp 16

## 2018-04-18 DIAGNOSIS — L509 Urticaria, unspecified: Secondary | ICD-10-CM | POA: Diagnosis not present

## 2018-04-18 MED ORDER — FAMOTIDINE 20 MG PO TABS: 20.0000 mg | ORAL_TABLET | Freq: Two times a day (BID) | ORAL | 2 refills | Status: DC

## 2018-04-18 NOTE — Progress Notes (Signed)
Follow Up Note  RE: Patricia Morton MRN: [id number] DOB: Oct 17, 1926 Date of Office Visit: 04/18/2018  Referring provider: Delsa Grana, PA-C Primary care provider: Delsa Grana, PA-C  Chief Complaint: Urticaria (doing bad allergic reaction to something)  History of Present Illness: I had the pleasure of seeing Patricia Morton for a follow up visit at the [facility] on 04/18/2018. She is a 85 y.o. female, who is being followed for urticaria. Today she is here for new complaint of worsening symptoms. She is accompanied today by her daughter who provided/contributed to the history. Her previous allergy office visit was on 03/28/2018 with Dr. Maudie Mercury.   Urticaria Patient saw PCP last week. TSH back to normal value. Still having daily hives but it does clear up at times. Currently on zyrtec 10mg  daily, hydroxyzine 20mg  BID. Stopped Singulair. Hydroxyzine does cause drowsiness.   Assessment and Plan: [name] is a 85 y.o. female with: Urticaria Past history - 3 episodes of rash/hives flare up since November 2019. Initially it was thought to be due from her medication change to Celexa however had 2 flares since stopping Celexa. The prednisone and antihistamines do seem to help. Other than the above medication changes denies any changes in diet, personal care products, recent infections. She did have bloodwork which showed elevated TSH level and her synthroid dose was adjusted.  Interim history - TSH normal, however still having daily hives.  Increase zyrtec to 10mg  twice a day as long as it does not cause drowsiness.   Start Pepcid 20mg  twice a day.  Continue Singulair 10mg  daily.  Only use hydroxyzine only if needed.  Keep track of hives. If not improved then okay to start oral prednisone taper.   Get bloodwork as below.   If negative will start on Xolair injections for the hives.  Return in about 4 weeks (around 05/16/2018).  Meds ordered this encounter  Medications  .  famotidine (PEPCID) 20 MG tablet    Sig: Take 1 tablet (20 mg total) by mouth 2 (two) times daily. For Hives    Dispense:  60 tablet    Refill:  2    Lab Orders     CBC with Differential     Comprehensive metabolic panel     ANA w/Reflex     C3 and C4     Alpha-Gal Panel     Protein Electrophoresis, (serum)     PE+Interp(Rfx IFE), 24-Hr U  Diagnostics: None.   Medication List:  Current Outpatient Medications  Medication Sig Dispense Refill  . Calcium-Magnesium-Vitamin D (CALCIUM 500 PO) Take by mouth.    . cetirizine (ZYRTEC) 10 MG tablet Take 1 tablet (10 mg total) by mouth daily. 30 tablet 5  . escitalopram (LEXAPRO) 10 MG tablet Take 1 tablet (10 mg total) by mouth daily. 90 tablet 1  . famotidine (PEPCID) 20 MG tablet Take 1 tablet (20 mg total) by mouth 2 (two) times daily. For Hives 60 tablet 2  . hydrOXYzine (ATARAX/VISTARIL) 10 MG tablet Take 1-2 tablets (10-20 mg total) by mouth 3 (three) times daily as needed for itching. 120 tablet 1  . levothyroxine (SYNTHROID, LEVOTHROID) 75 MCG tablet TAKE 1 TABLET BY MOUTH EVERY MORNING ON an EMPTY stomach. wait 45 minutes BEFORE eating 90 tablet 0  . metoprolol tartrate (LOPRESSOR) 50 MG tablet TAKE 1 TABLET BY MOUTH TWICE DAILY FOR BLOOD PRESSURE 180 tablet 0  . montelukast (SINGULAIR) 10 MG tablet Take 1 tablet (10 mg total) by  mouth at bedtime. 30 tablet 2  . simvastatin (ZOCOR) 40 MG tablet TAKE 1 TABLET BY MOUTH AT BEDTIME 90 tablet 0  . triamterene-hydrochlorothiazide (MAXZIDE-25) 37.5-25 MG tablet TAKE 1/2 TABLET BY MOUTH EVERY MORNING 45 tablet 1  . cimetidine (TAGAMET) 800 MG tablet Take 400 mg by mouth 3 (three) times daily.     No current facility-administered medications for this visit.    Allergies: Allergies  Allergen Reactions  . Celexa [Citalopram Hydrobromide] Hives   I reviewed her past medical history, social history, family history, and environmental history and no significant changes have been reported from  previous visit on 03/28/2018.  Review of Systems  Constitutional: Negative for appetite change, chills, fever and unexpected weight change.  HENT: Negative for congestion and rhinorrhea.   Eyes: Negative for itching.  Respiratory: Negative for cough, chest tightness, shortness of breath and wheezing.   Cardiovascular: Negative for chest pain.  Gastrointestinal: Negative for abdominal pain.  Genitourinary: Negative for difficulty urinating.  Skin: Positive for rash.  Neurological: Negative for headaches.   Objective: BP 106/62 (BP Location: Left Arm, Patient Position: Sitting, Cuff Size: Normal)   Pulse 64   Resp 16  There is no height or weight on file to calculate BMI. Physical Exam  Constitutional: She is oriented to person, place, and time. She appears well-developed and well-nourished.  HENT:  Head: Normocephalic and atraumatic.  Right Ear: External ear normal.  Left Ear: External ear normal.  Nose: Nose normal.  Mouth/Throat: Oropharynx is clear and moist.  Eyes: Conjunctivae and EOM are normal.  Neck: Neck supple.  Cardiovascular: Normal rate, regular rhythm and normal heart sounds. Exam reveals no gallop and no friction rub.  No murmur heard. Pulmonary/Chest: Effort normal and breath sounds normal. She has no wheezes. She has no rales.  Abdominal: Soft.  Lymphadenopathy:    She has no cervical adenopathy.  Neurological: She is alert and oriented to person, place, and time.  Skin: Skin is warm. Rash noted.  Urticarial rash on torso, thighs b/l  Psychiatric: She has a normal mood and affect. Her behavior is normal.  Nursing note and vitals reviewed.  Previous notes and tests were reviewed. The plan was reviewed with the patient/family, and all questions/concerned were addressed.  It was my pleasure to see Patricia Morton today and participate in her care. Please feel free to contact me with any questions or concerns.  Sincerely,  Rexene Alberts, DO Allergy & Immunology  [facility] office: [phone] [facility] office: [phone]

## 2018-04-18 NOTE — Assessment & Plan Note (Signed)
Past history - 3 episodes of rash/hives flare up since November 2019. Initially it was thought to be due from her medication change to Celexa however had 2 flares since stopping Celexa. The prednisone and antihistamines do seem to help. Other than the above medication changes denies any changes in diet, personal care products, recent infections. She did have bloodwork which showed elevated TSH level and her synthroid dose was adjusted.  Interim history - TSH normal, however still having daily hives.  Increase zyrtec to 10mg  twice a day as long as it does not cause drowsiness.   Start Pepcid 20mg  twice a day.  Continue Singulair 10mg  daily.  Only use hydroxyzine only if needed.  Keep track of hives. If not improved then okay to start oral prednisone taper.   Get bloodwork as below.   If negative will start on Xolair injections for the hives.

## 2018-04-18 NOTE — Patient Instructions (Addendum)
Increase zyrtec to 22m twice a day as long as it does not cause drowsiness.  Start pepcid 270mtwice a day. Continue singulair 1014maily. Only use hydroxyzine if needed.  Keep track of hives. If not improved then okay to start with oral prednisone taper.   Get bloodwork. CBC diff, CMP, ESR, CRP, ANA w/reflex, C3, C4, alpha-gal. SPEP and UPEP  If negative we will start your xolair injections for the hives.  Follow up in 1 month

## 2018-04-20 ENCOUNTER — Ambulatory Visit: Payer: Medicare Other | Admitting: Internal Medicine

## 2018-04-20 ENCOUNTER — Encounter: Payer: Self-pay | Admitting: Internal Medicine

## 2018-04-20 ENCOUNTER — Telehealth: Payer: Self-pay | Admitting: Allergy

## 2018-04-20 VITALS — BP 114/74 | HR 64 | Ht 63.0 in | Wt 152.8 lb

## 2018-04-20 DIAGNOSIS — E039 Hypothyroidism, unspecified: Secondary | ICD-10-CM

## 2018-04-20 DIAGNOSIS — L509 Urticaria, unspecified: Secondary | ICD-10-CM | POA: Diagnosis not present

## 2018-04-20 MED ORDER — FAMOTIDINE 20 MG PO TABS: 20.0000 mg | ORAL_TABLET | Freq: Two times a day (BID) | ORAL | 2 refills | Status: DC

## 2018-04-20 MED ORDER — HYDROXYZINE HCL 10 MG PO TABS: 10.0000 mg | ORAL_TABLET | Freq: Three times a day (TID) | ORAL | 0 refills | Status: DC | PRN

## 2018-04-20 NOTE — Telephone Encounter (Signed)
Patient was recently given two script and they were sent to the wrong pharmacy Patient is going home today Needs the hydroxyzine and Pepcid sent to the [facility] in [place].

## 2018-04-20 NOTE — Patient Instructions (Signed)
-   Continue Levothyroxine 75 mcg daily  - Please stop by the lab in 6 weeks  -You are on levothyroxine - which is your thyroid hormone supplement. You MUST take this consistently.  You should take this first thing in the morning on an empty stomach with water. You should not take it with other medications. Wait 65min to 1hr prior to eating. If you are taking any vitamins - please take these in the evening.   If you miss a dose, please take your missed dose the following day (double the dose for that day). You should have a pill box for ONLY levothyroxine on your bedside table to help you remember to take your medications.

## 2018-04-20 NOTE — Progress Notes (Signed)
Name: Patricia Morton  MRN/ DOB: [id number], [birth year]    Age/ Sex: 85 y.o., female    PCP: Delsa Grana, PA-C   Reason for Endocrinology Evaluation: Hypothyroidism     Date of Initial Endocrinology Evaluation: 04/20/2018     HPI: Ms. Patricia Morton is a 85 y.o. female with a past medical history of Hypothyroidism, HTN, OA and dyslipidemia . The patient presented for initial endocrinology clinic visit on 04/20/2018 for consultative assistance with her hypothyroidism.   She is accompanied by her daughter today   Was diagnosed with hypothyroidism for > 35 yrs. Over the years her dose has been adjusted multiple times due to difficult to control TSH. Dose ranging 50-88 mcg daily   Patient is compliant with medications, waits an hour before she eat breakfast. She takes MVI and calcium after lunch  She denies taking biotin.      HISTORY:  Past Medical History:  Past Medical History:  Diagnosis Date  . Anxiety   . Arthritis   . Cataract    surgically removed  . Hyperlipemia   . Hypertension   . Hypothyroid   . Urticaria     Past Surgical History:  Past Surgical History:  Procedure Laterality Date  . ABDOMINAL HYSTERECTOMY    . BREAST SURGERY     right breast -lumpectomy-benign  . CYSTECTOMY    . EYE SURGERY    . TOTAL KNEE ARTHROPLASTY Right 05/25/2015   Procedure: TOTAL KNEE ARTHROPLASTY;  Surgeon: Gaynelle Arabian, MD;  Location: [facility];  Service: Orthopedics;  Laterality: Right;  . TOTAL KNEE ARTHROPLASTY Left 12/14/2015   Procedure: LEFT TOTAL KNEE ARTHROPLASTY;  Surgeon: Gaynelle Arabian, MD;  Location: [facility];  Service: Orthopedics;  Laterality: Left;      Social History:  reports that she quit smoking about 16 years ago. Her smoking use included cigarettes. She started smoking about 40 years ago. She quit after 15.00 years of use. She has never used smokeless tobacco. She reports previous alcohol use. She reports that she does not use drugs.  Family History: family  history includes Heart disease in her brother.   HOME MEDICATIONS: Allergies as of 04/20/2018      Reactions   Celexa [citalopram Hydrobromide] Hives      Medication List       Accurate as of April 20, 2018  9:52 AM. Always use your most recent med list.        CALCIUM 500 PO Take by mouth.   cetirizine 10 MG tablet Commonly known as:  ZYRTEC Take 1 tablet (10 mg total) by mouth daily.   cimetidine 800 MG tablet Commonly known as:  TAGAMET Take 400 mg by mouth 3 (three) times daily.   escitalopram 10 MG tablet Commonly known as:  LEXAPRO Take 1 tablet (10 mg total) by mouth daily.   famotidine 20 MG tablet Commonly known as:  PEPCID Take 1 tablet (20 mg total) by mouth 2 (two) times daily. For Hives   hydrOXYzine 10 MG tablet Commonly known as:  ATARAX/VISTARIL Take 1-2 tablets (10-20 mg total) by mouth 3 (three) times daily as needed for itching.   levothyroxine 75 MCG tablet Commonly known as:  SYNTHROID, LEVOTHROID TAKE 1 TABLET BY MOUTH EVERY MORNING ON an EMPTY stomach. wait 45 minutes BEFORE eating   metoprolol tartrate 50 MG tablet Commonly known as:  LOPRESSOR TAKE 1 TABLET BY MOUTH TWICE DAILY FOR BLOOD PRESSURE   montelukast 10 MG tablet Commonly known as:  SINGULAIR Take 1 tablet (10 mg total) by mouth at bedtime.   simvastatin 40 MG tablet Commonly known as:  ZOCOR TAKE 1 TABLET BY MOUTH AT BEDTIME   triamterene-hydrochlorothiazide 37.5-25 MG tablet Commonly known as:  MAXZIDE-25 TAKE 1/2 TABLET BY MOUTH EVERY MORNING         REVIEW OF SYSTEMS: A comprehensive ROS was conducted with the patient and is negative except as per HPI and below:  Review of Systems  Constitutional: Negative for malaise/fatigue and weight loss.  Respiratory: Negative for cough and shortness of breath.   Cardiovascular: Negative for chest pain and palpitations.  Gastrointestinal: Negative for constipation and nausea.  Genitourinary: Positive for frequency.    Skin: Positive for rash.       Hives  Neurological: Negative for tingling and tremors.  Endo/Heme/Allergies: Negative for polydipsia.  Psychiatric/Behavioral: Positive for depression. The patient does not have insomnia.        OBJECTIVE:  VS: BP 114/74 (BP Location: Left Arm, Patient Position: Sitting, Cuff Size: Normal)   Pulse 64   Ht 5\' 3"  (1.6 m)   Wt 152 lb 12.8 oz (69.3 kg)   SpO2 94%   BMI 27.07 kg/m    Wt Readings from Last 3 Encounters:  04/20/18 152 lb 12.8 oz (69.3 kg)  04/11/18 151 lb 6 oz (68.7 kg)  03/28/18 150 lb (68 kg)     EXAM: General: Pt appears well and is in NAD  Hydration: Well-hydrated with moist mucous membranes and good skin turgor  Eyes: External eye exam normal without stare, lid lag or exophthalmos.  EOM intact.  PERRL.  Ears, Nose, Throat: Hearing: Grossly intact bilaterally Dental: Good dentition  Throat: Clear without mass, erythema or exudate  Neck: General: Supple without adenopathy. Thyroid: Thyroid size normal.  No goiter or nodules appreciated. No thyroid bruit.  Lungs: Clear with good BS bilat with no rales, rhonchi, or wheezes  Heart: Auscultation: RRR.  Abdomen: Normoactive bowel sounds, soft, nontender, without masses or organomegaly palpable  Extremities:  BL LE: No pretibial edema normal ROM and strength.  Skin: Hair: Texture and amount normal with gender appropriate distribution Skin Inspection: No rashes. Skin Palpation: Skin temperature, texture, and thickness normal to palpation  Neuro: Cranial nerves: II - XII grossly intact  Motor: Normal strength throughout DTRs: 2+ and symmetric in UE without delay in relaxation phase  Mental Status: Judgment, insight: Intact Orientation: Oriented to time, place, and person Mood and affect: No depression, anxiety, or agitation     DATA REVIEWED: Results for TRANA, RESSLER (MRN [id number]) as of 04/20/2018 09:52  Ref. Range 02/26/2018 09:49 04/11/2018 10:29  TSH Latest Ref Range:  0.40 - 4.50 mIU/L 7.69 (H) 1.92  T4,Free(Direct) Latest Ref Range: 0.8 - 1.8 ng/dL 1.2 1.5      ASSESSMENT/PLAN/RECOMMENDATIONS:   1. Acquired hypothyroidism   -Patient with multiple nonspecific symptoms that may or may not be attributed to her thyroid status.  -Based on her last TFT results from April 11, 2018 she is biochemically euthyroid -If her TSH consistent continues to be inconsistent we will consider switching to brand levothyroxine - - Pt educated extensively on the correct way to take levothyroxine (first thing in the morning with water, 30 minutes before eating or taking other medications). - Pt encouraged to double dose the following day if she were to miss a dose given long half-life of levothyroxine.  Medications : Continue levothyroxine 75 mcg daily Recheck TFTs in 6 weeks    Follow-up in  3 months     Signed electronically by: Mack Guise, MD  [facility] Endocrinology  [facility] [address] [place], [place] [address] Phone: [phone] FAX: [phone]   CC: Danna Hefty [facility] [address] [place] [address] Phone: [phone] Fax: [phone]   Return to Endocrinology clinic as below: Future Appointments  Date Time Provider [place]  05/04/2018 10:30 AM Lorretta Harp, MD [facility]  05/30/2018 10:30 AM Garnet Sierras, DO [facility] None

## 2018-04-20 NOTE — Telephone Encounter (Signed)
Spoke with patients daughter and informed that rx was sent to correct pharmacy.

## 2018-04-25 LAB — COMPREHENSIVE METABOLIC PANEL
ALT: 14 IU/L (ref 0–32)
AST: 17 IU/L (ref 0–40)
Albumin/Globulin Ratio: 2.4 — ABNORMAL HIGH (ref 1.2–2.2)
Albumin: 4.5 g/dL (ref 3.5–4.6)
Alkaline Phosphatase: 66 IU/L (ref 39–117)
BUN/Creatinine Ratio: 13 (ref 12–28)
BUN: 12 mg/dL (ref 10–36)
Bilirubin Total: 0.4 mg/dL (ref 0.0–1.2)
CHLORIDE: 100 mmol/L (ref 96–106)
CO2: 24 mmol/L (ref 20–29)
Calcium: 9.2 mg/dL (ref 8.7–10.3)
Creatinine, Ser: 0.9 mg/dL (ref 0.57–1.00)
GFR calc Af Amer: 65 mL/min/{1.73_m2} (ref >59)
GFR calc non Af Amer: 56 mL/min/{1.73_m2} — ABNORMAL LOW (ref >59)
Globulin, Total: 1.9 g/dL (ref 1.5–4.5)
Glucose: 119 mg/dL — ABNORMAL HIGH (ref 65–99)
Potassium: 4 mmol/L (ref 3.5–5.2)
Sodium: 140 mmol/L (ref 134–144)
Total Protein: 6.4 g/dL (ref 6.0–8.5)

## 2018-04-25 LAB — C3 AND C4
Complement C3, Serum: 113 mg/dL (ref 82–167)
Complement C4, Serum: 34 mg/dL (ref 14–44)

## 2018-04-25 LAB — CBC WITH DIFFERENTIAL/PLATELET
BASOS: 1 %
Basophils Absolute: 0 10*3/uL (ref 0.0–0.2)
EOS (ABSOLUTE): 0.1 10*3/uL (ref 0.0–0.4)
EOS: 2 %
HEMATOCRIT: 39.1 % (ref 34.0–46.6)
Hemoglobin: 13.5 g/dL (ref 11.1–15.9)
Immature Grans (Abs): 0 10*3/uL (ref 0.0–0.1)
Immature Granulocytes: 0 %
Lymphocytes Absolute: 1.1 10*3/uL (ref 0.7–3.1)
Lymphs: 18 %
MCH: 32.2 pg (ref 26.6–33.0)
MCHC: 34.5 g/dL (ref 31.5–35.7)
MCV: 93 fL (ref 79–97)
Monocytes Absolute: 0.4 10*3/uL (ref 0.1–0.9)
Monocytes: 6 %
Neutrophils Absolute: 4.5 10*3/uL (ref 1.4–7.0)
Neutrophils: 73 %
Platelets: 355 10*3/uL (ref 150–450)
RBC: 4.19 x10E6/uL (ref 3.77–5.28)
RDW: 11.9 % (ref 11.7–15.4)
WBC: 6.1 10*3/uL (ref 3.4–10.8)

## 2018-04-25 LAB — ALPHA-GAL PANEL
Alpha Gal IgE*: <0.1 kU/L (ref <0.10)
Beef (Bos spp) IgE: <0.1 kU/L (ref <0.35)
Class Interpretation: 0
Class Interpretation: 0
Class Interpretation: 0
Lamb/Mutton (Ovis spp) IgE: <0.1 kU/L (ref <0.35)
Pork (Sus spp) IgE: <0.1 kU/L (ref <0.35)

## 2018-04-25 LAB — PROTEIN ELECTROPHORESIS, SERUM
A/G Ratio: 1.4 (ref 0.7–1.7)
Albumin ELP: 3.7 g/dL (ref 2.9–4.4)
Alpha 1: 0.3 g/dL (ref 0.0–0.4)
Alpha 2: 0.8 g/dL (ref 0.4–1.0)
Beta: 1 g/dL (ref 0.7–1.3)
Gamma Globulin: 0.7 g/dL (ref 0.4–1.8)
Globulin, Total: 2.7 g/dL (ref 2.2–3.9)

## 2018-04-25 LAB — PE+INTERP(RFX IFE), 24-HR U
Albumin, U: 100 %
Alpha 1, Urine: 0 %
Alpha 2, Urine: 0 %
Beta, Urine: 0 %
Gamma Globulin, Urine: 0 %
PROTEIN UR: 8.2 mg/dL
Protein, 24H Urine: 123 mg/24 hr (ref 30–150)

## 2018-04-25 LAB — ANA W/REFLEX: Anti Nuclear Antibody(ANA): NEGATIVE

## 2018-05-04 ENCOUNTER — Ambulatory Visit: Payer: Medicare Other | Admitting: Cardiovascular Disease

## 2018-05-16 ENCOUNTER — Ambulatory Visit: Payer: Medicare Other | Admitting: Cardiovascular Disease

## 2018-05-16 ENCOUNTER — Encounter: Payer: Self-pay | Admitting: Cardiovascular Disease

## 2018-05-16 DIAGNOSIS — E7800 Pure hypercholesterolemia, unspecified: Secondary | ICD-10-CM

## 2018-05-16 DIAGNOSIS — R0789 Other chest pain: Secondary | ICD-10-CM

## 2018-05-16 DIAGNOSIS — I10 Essential (primary) hypertension: Secondary | ICD-10-CM | POA: Diagnosis not present

## 2018-05-16 NOTE — Progress Notes (Signed)
05/16/2018 [name]   [birth year]  [id number]  Primary Physician Delsa Grana, PA-C Primary Cardiologist: Lorretta Harp MD Lupe Carney, [place]  HPI:  Patricia Morton is a 85 y.o. mildly overweight widowed Caucasian female mother of 2, grandmother for grandchildren referred by [name], PA-C to be established in my practice for ongoing cardiovascular care.  She is accompanied by her daughter [name].  Her cardiovascular risk factor profile is notable for treated hypertension and hyperlipidemia.  There is a strong family history of heart disease with 3 brothers and a sister all who who have had ischemic heart disease.  She is never had a heart attack or stroke.  Other than the recent episode November of atypical chest pain that resulted in an hospital admission in [place].she was referred to a cardiologist there who apparently did a stress echo that was negative.  She has had no prior or subsequent episodes of chest pain.  Current Meds  Medication Sig  . Calcium-Magnesium-Vitamin D (CALCIUM 500 PO) Take by mouth.  . cetirizine (ZYRTEC) 10 MG tablet Take 1 tablet (10 mg total) by mouth daily.  Marland Kitchen escitalopram (LEXAPRO) 10 MG tablet Take 1 tablet (10 mg total) by mouth daily.  . famotidine (PEPCID) 20 MG tablet Take 1 tablet (20 mg total) by mouth 2 (two) times daily. For Hives  . hydrOXYzine (ATARAX/VISTARIL) 10 MG tablet Take 1-2 tablets (10-20 mg total) by mouth 3 (three) times daily as needed for itching.  . levothyroxine (SYNTHROID, LEVOTHROID) 75 MCG tablet TAKE 1 TABLET BY MOUTH EVERY MORNING ON an EMPTY stomach. wait 45 minutes BEFORE eating  . metoprolol tartrate (LOPRESSOR) 50 MG tablet TAKE 1 TABLET BY MOUTH TWICE DAILY FOR BLOOD PRESSURE  . montelukast (SINGULAIR) 10 MG tablet Take 1 tablet (10 mg total) by mouth at bedtime.  . simvastatin (ZOCOR) 40 MG tablet TAKE 1 TABLET BY MOUTH AT BEDTIME  . triamterene-hydrochlorothiazide (MAXZIDE-25) 37.5-25 MG tablet TAKE 1/2 TABLET BY  MOUTH EVERY MORNING     Allergies  Allergen Reactions  . Celexa [Citalopram Hydrobromide] Hives    Social History   Socioeconomic History  . Marital status: Widowed    Spouse name: Not on file  . Number of children: Not on file  . Years of education: Not on file  . Highest education level: Not on file  Occupational History  . Not on file  Social Needs  . Financial resource strain: Not on file  . Food insecurity:    Worry: Not on file    Inability: Not on file  . Transportation needs:    Medical: Not on file    Non-medical: Not on file  Tobacco Use  . Smoking status: Former Smoker    Years: 15.00    Types: Cigarettes    Start date: 1980    Last attempt to quit: 2004    Years since quitting: 16.1  . Smokeless tobacco: Never Used  Substance and Sexual Activity  . Alcohol use: Not Currently    Comment: wine occassionally  . Drug use: No  . Sexual activity: Not Currently  Lifestyle  . Physical activity:    Days per week: Not on file    Minutes per session: Not on file  . Stress: Not on file  Relationships  . Social connections:    Talks on phone: Not on file    Gets together: Not on file    Attends religious service: Not on file    Active  member of club or organization: Not on file    Attends meetings of clubs or organizations: Not on file    Relationship status: Not on file  . Intimate partner violence:    Fear of current or ex partner: Not on file    Emotionally abused: Not on file    Physically abused: Not on file    Forced sexual activity: Not on file  Other Topics Concern  . Not on file  Social History Narrative  . Not on file     Review of Systems: General: negative for chills, fever, night sweats or weight changes.  Cardiovascular: negative for chest pain, dyspnea on exertion, edema, orthopnea, palpitations, paroxysmal nocturnal dyspnea or shortness of breath Dermatological: negative for rash Respiratory: negative for cough or wheezing Urologic:  negative for hematuria Abdominal: negative for nausea, vomiting, diarrhea, bright red blood per rectum, melena, or hematemesis Neurologic: negative for visual changes, syncope, or dizziness All other systems reviewed and are otherwise negative except as noted above.    Blood pressure (!) 148/70, pulse 62, height 5\' 3"  (1.6 m), weight 156 lb 12.8 oz (71.1 kg).  General appearance: alert and no distress Neck: no adenopathy, no carotid bruit, no JVD, supple, symmetrical, trachea midline and thyroid not enlarged, symmetric, no tenderness/mass/nodules Lungs: clear to auscultation bilaterally Heart: regular rate and rhythm, S1, S2 normal, no murmur, click, rub or gallop Extremities: extremities normal, atraumatic, no cyanosis or edema Pulses: 2+ and symmetric Skin: Skin color, texture, turgor normal. No rashes or lesions Neurologic: Alert and oriented X 3, normal strength and tone. Normal symmetric reflexes. Normal coordination and gait  EKG normal sinus rhythm at 62 without ST or T wave changes.  I personally reviewed this EKG.  ASSESSMENT AND PLAN:   Pure hypercholesterolemia History of hyperlipidemia on statin therapy with lipid profile performed 02/26/2018 revealing total cholesterol 187, LDL of 84 and HDL of 87.  Benign essential hypertension History of essential hypertension her blood pressure measured today at 148/70.  This appears to be higher than her most recent blood pressure readings over the last month or 2.  She is on metoprolol and Maxide.  Atypical chest pain Ms. [name] was admitted to [facility] him in November for atypical chest pain.  In retrospect, she attributed this to an anxiety attack.  She ruled out for myocardial infarction.  Apparently she had some diagnostic tests which were minimally abnormal and she was recommended a cardiologist there who evaluated her.  She is moving in with her daughter [name] in [place] and is here to be established for ongoing  care.      Lorretta Harp MD FACP,FACC,FAHA, [facility] 05/16/2018 9:45 AM

## 2018-05-16 NOTE — Assessment & Plan Note (Signed)
Ms. Bonnell was admitted to [facility] him in November for atypical chest pain.  In retrospect, she attributed this to an anxiety attack.  She ruled out for myocardial infarction.  Apparently she had some diagnostic tests which were minimally abnormal and she was recommended a cardiologist there who evaluated her.  She is moving in with her daughter [name] in [place] and is here to be established for ongoing care.

## 2018-05-16 NOTE — Patient Instructions (Signed)

## 2018-05-16 NOTE — Assessment & Plan Note (Signed)
History of hyperlipidemia on statin therapy with lipid profile performed 02/26/2018 revealing total cholesterol 187, LDL of 84 and HDL of 87.

## 2018-05-16 NOTE — Assessment & Plan Note (Signed)
History of essential hypertension her blood pressure measured today at 148/70.  This appears to be higher than her most recent blood pressure readings over the last month or 2.  She is on metoprolol and Maxide.

## 2018-05-24 ENCOUNTER — Other Ambulatory Visit: Payer: Self-pay | Admitting: Allergy

## 2018-05-25 DIAGNOSIS — D485 Neoplasm of uncertain behavior of skin: Secondary | ICD-10-CM | POA: Diagnosis not present

## 2018-05-25 DIAGNOSIS — D225 Melanocytic nevi of trunk: Secondary | ICD-10-CM | POA: Diagnosis not present

## 2018-05-25 DIAGNOSIS — L820 Inflamed seborrheic keratosis: Secondary | ICD-10-CM | POA: Diagnosis not present

## 2018-05-28 ENCOUNTER — Telehealth: Payer: Self-pay | Admitting: Family Medicine

## 2018-05-28 NOTE — Telephone Encounter (Signed)
Patient's daughter called in stating that patient has had a flare up with hives. She states hives appear to be fluid filled and she has tried pepcid, Zyrtec, and Hydroxyzine with no relief. She is scheduled to see the Allergist on Wednesday to start the Xolair injections but they can not see her any sooner. She would like to know if you recommend anything in the interim. Please advise?

## 2018-05-29 ENCOUNTER — Ambulatory Visit (INDEPENDENT_AMBULATORY_CARE_PROVIDER_SITE_OTHER): Payer: Medicare Other | Admitting: Family Medicine

## 2018-05-29 ENCOUNTER — Other Ambulatory Visit: Payer: Self-pay

## 2018-05-29 VITALS — BP 112/74 | HR 60 | Temp 98.2°F | Resp 16 | Wt 157.4 lb

## 2018-05-29 DIAGNOSIS — L509 Urticaria, unspecified: Secondary | ICD-10-CM

## 2018-05-29 MED ORDER — FAMOTIDINE 20 MG PO TABS: 20.0000 mg | ORAL_TABLET | Freq: Two times a day (BID) | ORAL | 2 refills | Status: DC

## 2018-05-29 MED ORDER — CETIRIZINE HCL 10 MG PO TABS: 10.0000 mg | ORAL_TABLET | Freq: Two times a day (BID) | ORAL | 1 refills | Status: DC

## 2018-05-29 MED ORDER — DIPHENHYDRAMINE HCL 50 MG/ML IJ SOLN
50.0000 mg | Freq: Once | INTRAMUSCULAR | Status: AC
  Administered 2018-05-29: 50 mg via INTRAMUSCULAR

## 2018-05-29 MED ORDER — MONTELUKAST SODIUM 10 MG PO TABS: 10.0000 mg | ORAL_TABLET | Freq: Every day | ORAL | 1 refills | Status: DC

## 2018-05-29 MED ORDER — METHYLPREDNISOLONE ACETATE 40 MG/ML IJ SUSP
60.0000 mg | Freq: Once | INTRAMUSCULAR | Status: AC
  Administered 2018-05-29: 60 mg via INTRAMUSCULAR

## 2018-05-29 MED ORDER — EPINEPHRINE 0.3 MG/0.3ML IJ SOAJ: 0.3000 mg | INTRAMUSCULAR | 1 refills | Status: AC | PRN

## 2018-05-29 MED ORDER — EPINEPHRINE 0.3 MG/0.3ML IJ SOAJ: 0.3000 mg | INTRAMUSCULAR | 1 refills | Status: DC | PRN

## 2018-05-29 NOTE — Telephone Encounter (Signed)
Patient is scheduled to come in today. Please see below message as specialist recommended that continue treatment as they had prescribed previously until they are seen.

## 2018-05-29 NOTE — Telephone Encounter (Signed)
We would need to see them to treat appropriately.  She should be doing her antihistamines, pepcid, hydroxizine when worse.  Many other conditions can cause rash/vesicles.  I cannot call in anything over the phone.  Have they called the specialist to see if they recommend anything in the meantime?

## 2018-05-29 NOTE — Progress Notes (Signed)
Patient ID: Patricia Morton, female    DOB: [birth year], 85 y.o.   MRN: [id number]  PCP: Delsa Grana, PA-C  Chief Complaint  Patient presents with  . Urticaria    on abd, arms, neck    Subjective:   Patricia Morton is a 85 y.o. female, presents to clinic with CC of worsening hives.  Several weeks ago with worsening hives pt took left over steroid taper - prednisone 20 mg tablets Sig: 2 tabs poqday 1-3, 1 tabs poqday 4-6, 1/2 (10 mg) tabs poqday 7-10 Finished over 1-2 weeks ago. The additional steroids did seem to help decrease the hives up until about 1 to 2 weeks ago.  They are not sure about the timing of most of the medications.  After steroids were complete, patient saw dermotology last week, she is primarily having a few skin lesions removed,, they suggested she meds, (changed zyrtec BID to xyzal daily, and pepcid BID to tagament daily and I believe they also told her to stop singulair - pt and daughter not sure) and after seeing dermatology pt's rash/hives acutely worsened over 2-3 days, spreading to more areas of her body including her abdomen and her legs chest neck back and scalp.  The pt and her daughter did switch back to meds recommended by allergist (possibly not doing all meds BID though) rash improved/slowed a little bit, but overall continued to get worse.  For the past 3 to 4 days she has been taking hydroxyzine 20 mg 3 times a day also without much improvement to the hives.  The last 2 nights she has taken Benadryl in place of hydroxyzine to help her sleep because her itching has been so severe she had tremendous difficulty sleeping.  The patient and her daughter called allergist to get in but she had wait till Wednesday to be seen, she called our office to inquire and she was encouraged to be seen in person to properly assess. Sees allergist tomorrow. Patient continues to note no new foods, medications, detergents, soaps, lotions.  She has still been going between her home and her  daughter's home.  Lexapro dose and source of her medicine has been the same, she has been on the same blood pressure medicine for a long time. Hydroxyzine used to make the patient be very sleepy but she has increased the dose and taking it more frequently and she is no longer having that side effect. She believes she has retained some fluid to her abdomen she feels swollen even has a roll above her bellybutton which has not been there before and she is concerned that the frequency and use of steroids is made her cause weight. She has no scratch of broken skin, drainage, vesicles/pustules anywhere.  No longer has patches, all seem to be hives/welts scattered all over her body. Over the last 2 days she has had them progressed up her neck to her jawline she also has several scattered across her face but she denies any swelling around her eyes or of her lips.  She denies any intraoral, lingual swelling, no sensation of throat closure, no metallic taste to her mouth, no stridor no wheeze no shortness of breath  .  Patient Active Problem List   Diagnosis Date Noted  . Urticaria 03/28/2018  . Osteoarthritis, generalized 02/09/2018  . Pure hypercholesterolemia 02/09/2018  . Insomnia 02/09/2018  . Depression, controlled 02/09/2018  . Cyst of right kidney 02/09/2018  . Benign essential hypertension 02/09/2018  . Anxiety disorder,  unspecified 02/09/2018  . Acquired hypothyroidism 02/09/2018  . Atypical chest pain 02/09/2018  . Coronary artery disease involving native heart 02/09/2018  . OA (osteoarthritis) of knee 05/25/2015     Prior to Admission medications   Medication Sig Start Date End Date Taking? Authorizing Provider  cetirizine (ZYRTEC) 10 MG tablet Take 1 tablet (10 mg total) by mouth 2 (two) times daily. 05/29/18  Yes Delsa Grana, PA-C  escitalopram (LEXAPRO) 10 MG tablet Take 1 tablet (10 mg total) by mouth daily. 03/19/18  Yes Delsa Grana, PA-C  famotidine (PEPCID) 20 MG tablet Take 1  tablet (20 mg total) by mouth 2 (two) times daily. For Hives 05/29/18  Yes Delsa Grana, PA-C  hydrOXYzine (ATARAX/VISTARIL) 10 MG tablet TAKE 1 OR 2 TABLETS BY MOUTH THREE TIMES DAILY AS NEEDED FOR ITCHING 05/24/18  Yes Garnet Sierras, DO  levothyroxine (SYNTHROID, LEVOTHROID) 75 MCG tablet TAKE 1 TABLET BY MOUTH EVERY MORNING ON an EMPTY stomach. wait 45 minutes BEFORE eating 04/06/18  Yes Delsa Grana, PA-C  metoprolol tartrate (LOPRESSOR) 50 MG tablet TAKE 1 TABLET BY MOUTH TWICE DAILY FOR BLOOD PRESSURE 04/06/18  Yes Delsa Grana, PA-C  montelukast (SINGULAIR) 10 MG tablet Take 1 tablet (10 mg total) by mouth at bedtime. 05/29/18  Yes Delsa Grana, PA-C  simvastatin (ZOCOR) 40 MG tablet TAKE 1 TABLET BY MOUTH AT BEDTIME 04/06/18  Yes Delsa Grana, PA-C  triamterene-hydrochlorothiazide (MAXZIDE-25) 37.5-25 MG tablet TAKE 1/2 TABLET BY MOUTH EVERY MORNING 04/06/18  Yes [place], Modena Nunnery, MD  EPINEPHrine 0.3 mg/0.3 mL IJ SOAJ injection Inject 0.3 mLs (0.3 mg total) into the muscle as needed for anaphylaxis. 05/29/18   Delsa Grana, PA-C     Allergies  Allergen Reactions  . Celexa [Citalopram Hydrobromide] Hives     Family History  Problem Relation Age of Onset  . Heart disease Brother   . Hypothyroidism Mother   . Hypotension Sister   . Allergic rhinitis Neg Hx   . Asthma Neg Hx   . Eczema Neg Hx   . Urticaria Neg Hx      Social History   Socioeconomic History  . Marital status: Widowed    Spouse name: Not on file  . Number of children: Not on file  . Years of education: Not on file  . Highest education level: Not on file  Occupational History  . Not on file  Social Needs  . Financial resource strain: Not on file  . Food insecurity:    Worry: Not on file    Inability: Not on file  . Transportation needs:    Medical: Not on file    Non-medical: Not on file  Tobacco Use  . Smoking status: Former Smoker    Years: 15.00    Types: Cigarettes    Start date: 1980    Last attempt  to quit: 2004    Years since quitting: 16.2  . Smokeless tobacco: Never Used  Substance and Sexual Activity  . Alcohol use: Not Currently    Comment: wine occassionally  . Drug use: No  . Sexual activity: Not Currently  Lifestyle  . Physical activity:    Days per week: Not on file    Minutes per session: Not on file  . Stress: Not on file  Relationships  . Social connections:    Talks on phone: Not on file    Gets together: Not on file    Attends religious service: Not on file    Active member of club  or organization: Not on file    Attends meetings of clubs or organizations: Not on file    Relationship status: Not on file  . Intimate partner violence:    Fear of current or ex partner: Not on file    Emotionally abused: Not on file    Physically abused: Not on file    Forced sexual activity: Not on file  Other Topics Concern  . Not on file  Social History Narrative  . Not on file     Review of Systems  Constitutional: Negative.  Negative for activity change, appetite change, chills, diaphoresis and fever.  HENT: Negative.  Negative for rhinorrhea, sinus pressure, sinus pain, sneezing, sore throat, trouble swallowing and voice change.   Eyes: Negative.  Negative for photophobia.  Respiratory: Negative.  Negative for choking, chest tightness, shortness of breath and wheezing.   Cardiovascular: Negative.  Negative for chest pain.  Gastrointestinal: Negative.  Negative for abdominal pain, diarrhea, nausea and vomiting.  Endocrine: Negative.   Genitourinary: Negative.  Negative for decreased urine volume, difficulty urinating, hematuria and urgency.  Musculoskeletal: Negative.  Negative for arthralgias and joint swelling.  Skin: Negative.  Negative for wound.  Allergic/Immunologic: Negative.   Neurological: Negative.  Negative for dizziness, light-headedness and headaches.  Hematological: Negative.   Psychiatric/Behavioral: Negative.   All other systems reviewed and are  negative.      Objective:    Vitals:   05/29/18 1522  BP: 112/74  Pulse: 60  Resp: 16  Temp: 98.2 F (36.8 C)  SpO2: 96%  Weight: 157 lb 6.4 oz (71.4 kg)      Physical Exam Vitals signs and nursing note reviewed.  Constitutional:      General: She is not in acute distress.    Appearance: Normal appearance. She is well-developed. She is not ill-appearing, toxic-appearing or diaphoretic.  HENT:     Head: Normocephalic and atraumatic.     Right Ear: External ear normal. There is no impacted cerumen.     Left Ear: External ear normal. There is no impacted cerumen.     Nose: Nose normal. No congestion or rhinorrhea.     Mouth/Throat:     Mouth: Mucous membranes are moist.     Pharynx: Oropharynx is clear. Uvula midline. No oropharyngeal exudate or posterior oropharyngeal erythema.     Comments: Airway intact, stridor, uvula midline, no uvular edema or erythema Lips normal size and shape no swelling to lips or periorbital swelling Scattered wheals to face neck and scalp Eyes:     General: Lids are normal. No scleral icterus.    Conjunctiva/sclera: Conjunctivae normal.     Pupils: Pupils are equal, round, and reactive to light.  Neck:     Musculoskeletal: Normal range of motion and neck supple.     Trachea: Phonation normal. No tracheal deviation.  Cardiovascular:     Rate and Rhythm: Normal rate and regular rhythm.     Pulses: Normal pulses.          Radial pulses are 2+ on the right side and 2+ on the left side.       Posterior tibial pulses are 2+ on the right side and 2+ on the left side.     Heart sounds: Normal heart sounds. No murmur. No friction rub. No gallop.   Pulmonary:     Effort: Pulmonary effort is normal. No respiratory distress.     Breath sounds: Normal breath sounds. No stridor. No wheezing, rhonchi or rales.  Chest:  Chest wall: No tenderness.  Abdominal:     General: Abdomen is flat. Bowel sounds are normal. There is no distension.      Palpations: Abdomen is soft.     Tenderness: There is no abdominal tenderness.  Musculoskeletal: Normal range of motion.        General: No deformity.  Lymphadenopathy:     Cervical: No cervical adenopathy.  Skin:    General: Skin is warm and dry.     Capillary Refill: Capillary refill takes less than 2 seconds.     Coloration: Skin is not pale.     Findings: Rash present.     Comments: Scattered large wheals 1 to 3 to 4 cm in diameter across most of her body occluding her back abdomen lower extremities anterior neck up to her jawline, upper extremities No patches, scales, excoriations, skin appears intact, do not see any vesicles pustules No skin indurated hot to the touch or fluctuant surrounding any of the rash areas  Neurological:     Mental Status: She is alert and oriented to person, place, and time.     Motor: No weakness or abnormal muscle tone.     Gait: Gait normal.  Psychiatric:        Mood and Affect: Mood normal.        Speech: Speech normal.        Behavior: Behavior normal.           Assessment & Plan:   85 y/o female with current hives and rashes presents with worsening rash despite trying all meds    ICD-10-CM   1. Full body hives L50.9 methylPREDNISolone acetate (DEPO-MEDROL) injection 60 mg    diphenhydrAMINE (BENADRYL) injection 50 mg    EPINEPHrine 0.3 mg/0.3 mL IJ SOAJ injection    montelukast (SINGULAIR) 10 MG tablet    famotidine (PEPCID) 20 MG tablet    cetirizine (ZYRTEC) 10 MG tablet    I have seen this patient many times for reports of recurrent hives, this the first time in clinic ever that she has actually had wheals and hives present other times were erythematous maculopapular rashes that were excoriated and some thickened appeared to be from chronic scratching and itching.  Rash appears severe, I reviewed her medications at length with her and her daughter, have instructed them to reappointment the management treatment advised by dermatology,  can continue hydroxyzine 10 to 20 mg 3 times a day as needed, but could alternatively try Benadryl 25 to 50 mg p.o. every 4-6h, with a maximum dose of 300 but I have advised him to stay much lower than this around 200 and mostly to use at night for sleep.  She has no airway involvement I do not see any anaphylactic reaction,'s airway closure or signs of anaphylactic shock or allergic reaction elsewhere no other systems seem to be involved no headache no abdominal pain nausea vomiting, no wheeze or stridor.  Because of the severity of the rash the patient scratching herself very vigorously in clinic I did not want to start any more steroids but did agree to a IM dose of steroids IM Benadryl to follow-up tomorrow with allergy specialist.  Also advised they can take another dose of Benadryl at 8 or 9 PM.  Dosing printed for the patient and her daughter, dosing annotated hand written circled by me and reviewed multiple times.    Currently hives appear idiopathic - allergy results were pending, will review OV and those results when complete.  They were going to start injections, will follow   Instructions given to pt and her daughter:  Increase zyrtec to 10mg  twice a day as long as it does not cause drowsiness.   Start Pepcid 20mg  twice a day.  Continue Singulair 10mg  daily.  Only use hydroxyzine only if needed - 10 mg take, take 10-20 mg by mouth as needed up to three times a day for worsening hives on top of everything above  Or if hydroxyzine is ineffective you can take instead benadryl 25-50 mg by mouth every 4-6 hours as needed.  Max in 24 hours is 300 mg, but I would try to stay below 200 mg to avoid side effects.  Epi pen sent to pharmacy   Also signs and sx of anaphlaxtic reaction reviewed -they were first advised to take a another dose of Benadryl 50 mg dose of Pepcid and then if having signs of worsening periorbital or intraoral swelling or shortness of breath or wheeze she was  instructed to administer the EpiPen to the lateral thigh, to review this further with the pharmacy, printed material was also given.  All meds refilled and sent to different pharmacy near daughters home to make sure she can get if she stays with her daughter for a while with COVID-19  Due to potential life-threatening condition with worsening rash, pt was in clinic for > 50 min, IM injections given, pt rechecked, airway reassesed prior to leaving in good condition.  Greater than 50% of this visit was spent in direct face-to-face counseling, obtaining history and physical, discussing and educating pt on treatment plan.  Total time of this visit was 50 min.  Remainder of time involved but was not limited to reviewing chart (recent and pertinent OV notes and labs), documentation in EMR, and coordinating care and treatment plan.    Delsa Grana, PA-C 05/29/18 4:30 PM

## 2018-05-29 NOTE — Telephone Encounter (Signed)
They called the specialist and was told the soonest they could see them was Wednesday and in the meantime they were to continue all medications that were given to them.

## 2018-05-29 NOTE — Patient Instructions (Addendum)
Increase zyrtec to 10mg  twice a day as long as it does not cause drowsiness.   Start Pepcid 20mg  twice a day.  Continue Singulair 10mg  daily.  Only use hydroxyzine only if needed - 10 mg take, take 10-20 mg by mouth as needed up to three times a day for worsening hives on top of everything above  Or if hydroxyzine is ineffective you can take instead benadryl 25-50 mg by mouth every 4-6 hours as needed.  Max in 24 hours is 300 mg, but I would try to stay below 200 mg to avoid side effects.  Epi pen sent to pharmacy - have them review how to use.  Administer epipen to your self if you have swelling of lips, eyes, sensation of throat closure, stridor, shortness of breath, or wheeze, that is worsening and not improved with your meds + benadryl.   Anaphylactic Reaction, Adult An anaphylactic reaction (anaphylaxis) is a sudden, serious allergic reaction. This affects more than one part of your body. It can be life-threatening. If you have an anaphylactic reaction, you need to get medical help right away. What are the causes? This condition is caused by exposure to things that give you an allergic reaction (allergens). Common allergens include:  Foods, such as peanuts, wheat, shellfish, milk, and eggs.  Medicines.  Insect bites or stings.  Blood or parts of blood received for treatment (transfusions).  Chemicals, such as latex and dyes that are used in food and in medical tests. What are the signs or symptoms? Signs of an anaphylactic reaction may include:  Feeling warm in the face (flushed). Your face may turn red.  Itchy, red, swollen areas of skin (hives).  Swelling of the: ? Eyes. ? Lips. ? Face. ? Mouth. ? Tongue. ? Throat.  Trouble with any of these: ? Breathing. ? Talking. ? Swallowing.  Loud breathing (wheezing).  Feeling dizzy or light-headed.  Passing out (fainting).  Pain or cramps in your belly.  Throwing up (vomiting).  Watery poop (diarrhea). How  is this diagnosed? This condition is diagnosed based on:  Your symptoms.  A physical exam.  Blood tests.  Recent exposure to things that give you an allergic reaction. How is this treated? If you think you are having an anaphylactic reaction, you should do this right away:  Give yourself a shot of medicine (epinephrine) using an auto-injector "pen." Your doctor will teach you how to use this pen.  Call for emergency help. If you use a pen, you must still get treated in the hospital. There, you may be given: ? Medicines. ? Oxygen. ? Fluids in an IV tube. Follow these instructions at home: Safety  Always keep an auto-injector pen with you. This could save your life. Use it as told by your doctor.  Do not drive after a reaction. Wait until your doctor says it is safe to drive.  Make sure that you, the people who live with you, and your employer know: ? What you are allergic to, so you can stay away from it. ? How to use your auto-injector pen.  Wear a bracelet or necklace that says you have an allergy, if your doctor tells you to do this.  Learn the signs of a very bad allergic reaction. This way, you can treat it right away.  Work with your doctors to make a plan for what to do if you have a very bad reaction. It is important to be ready. If you use your auto-injector pen:  Get more medicine (epinephrine) for your pen right away. This is important in case you have another reaction.  Get help right away. To avoid a serious allergic reaction:  Avoid things that gave you a very bad allergic reaction before.  Tell your server about your allergy when you go out to eat. If you are not sure if your meal has food that you are allergic to, ask your server before you eat it. General instructions  Take over-the-counter and prescription medicines only as told by your doctor.  If you have itchy, red, swollen areas of skin or a rash: ? Use an over-the-counter medicine  (antihistamine) as told by your doctor. ? Put cold, wet cloths on your skin. ? Take a cool bath or shower. Avoid hot water.  Tell all doctors who care for you that you have an allergy.  Keep all follow-up visits as told by your doctor. This is important. Get help right away if:  You have signs of an allergic reaction. You may notice them soon after being exposed to things that give you an allergic reaction. Signs may include: ? Warmth in your face. Your face may turn red. ? Itchy, red, swollen areas of skin. ? Swelling of your:  Eyes.  Lips.  Face.  Mouth.  Tongue.  Throat. ? Trouble with any of these:  Breathing.  Talking.  Swallowing. ? Loud breathing (wheezing). ? Feeling dizzy or light-headed. ? Passing out. ? Pain or cramps in your belly. ? Throwing up. ? Watery poop.  You had to use your auto-injector pen. You must go to the emergency room even if the medicine seems to be working. This is because another allergic reaction may happen within 3 days (rebound anaphylaxis). These symptoms may be an emergency. Do not wait to see if the symptoms will go away. Do this right away:  Use your auto-injector pen as you have been told.  Get medical help. Call your local emergency services (911 in the [place] Do not drive yourself to the hospital. Summary  An anaphylactic reaction (anaphylaxis) is a sudden, serious allergic reaction.  This condition can be life-threatening. If you have a reaction, get medical help right away.  Your doctor will show you how to give yourself a shot (epinephrine injection) with an auto-injector "pen."  Always keep an auto-injector pen with you. It could save your life. Use it as told by your doctor.  If you had to use your auto-injector pen, you must go to the emergency room. Go there even if the medicine seems to be working. This information is not intended to replace advice given to you by your health care provider. Make sure you discuss  any questions you have with your health care provider. Document Released: 08/17/2007 Document Revised: 06/22/2017 Document Reviewed: 06/22/2017 Elsevier Interactive Patient Education  2019 [place] are itchy, red, swollen areas on your skin. Hives can show up on any part of your body. Hives often fade within 24 hours (acute hives). New hives can show up after old ones fade. This can go on for many days or weeks (chronic hives). Hives do not spread from person to person (are not contagious). Hives are caused by your body's response to something that you are allergic to (allergen). These are sometimes called triggers. You can get hives right after being around a trigger, or hours later. What are the causes?  Allergies to foods.  Insect bites or stings.  Pollen.  Pets.  Latex.  Chemicals.  Spending time in sunlight, heat, or cold.  Exercise.  Stress.  Some medicines.  Viruses. This includes the common cold.  Infections caused by germs (bacteria).  Allergy shots.  Blood transfusions. Sometimes, the cause is not known. What increases the risk?  Being a woman.  Being allergic to foods such as: ? Citrus fruits. ? Milk. ? Eggs. ? Peanuts. ? Tree nuts. ? Shellfish.  Being allergic to: ? Medicines. ? Latex. ? Insects. ? Animals. ? Pollen. What are the signs or symptoms?   Raised, itchy, red or white bumps or patches on your skin. These areas may: ? Get large and swollen. ? Change in shape and location. ? Stand alone or connect to each other over a large area of skin. ? Sting or hurt. ? Turn white when pressed in the center (blanch). In very bad cases, your hands, feet, and face may also get swollen. This may happen if hives start deeper in your skin. How is this treated? Treatment for this condition depends on your symptoms. Treatment may include:  Using cool, wet cloths (cool compresses) or taking cool showers to stop the  itching.  Medicines that help: ? Relieve itching (antihistamines). ? Reduce swelling (corticosteroids). ? Treat infection (antibiotics).  A medicine (omalizumab) that is given as a shot (injection). Your doctor may prescribe this if you have hives that do not get better even after other treatments.  In very bad cases, you may need a shot of a medicine called epinephrineto prevent a life-threatening allergic reaction (anaphylaxis). Follow these instructions at home: Medicines  Take or apply over-the-counter and prescription medicines only as told by your doctor.  If you were prescribed an antibiotic medicine, use it as told by your doctor. Do not stop using it even if you start to feel better. Skin care  Apply cool, wet cloths to the hives.  Do not scratch your skin. Do not rub your skin. General instructions  Do not take hot showers or baths. This can make itching worse.  Do not wear tight clothes.  Use sunscreen and wear clothes that cover your skin when you are outside.  Avoid any triggers that cause your hives. Keep a journal to help track what causes your hives. Write down: ? What medicines you take. ? What you eat and drink. ? What products you use on your skin.  Keep all follow-up visits as told by your doctor. This is important. Contact a doctor if:  Your symptoms are not better with medicine.  Your joints hurt or are swollen. Get help right away if:  You have a fever.  You have pain in your belly (abdomen).  Your tongue or lips are swollen.  Your eyelids are swollen.  Your chest or throat feels tight.  You have trouble breathing or swallowing. These symptoms may be an emergency. Do not wait to see if the symptoms will go away. Get medical help right away. Call your local emergency services (911 in the [place] Do not drive yourself to the hospital. Summary  Hives are itchy, red, swollen areas on your skin.  Treatment for this condition depends on your  symptoms.  Avoid things that cause your hives. Keep a journal to help track what causes your hives.  Take and apply over-the-counter and prescription medicines only as told by your doctor.  Keep all follow-up visits as told by your doctor. This is important. This information is not intended to replace advice given to you by your  health care provider. Make sure you discuss any questions you have with your health care provider. Document Released: 12/08/2007 Document Revised: 09/13/2017 Document Reviewed: 09/13/2017 Elsevier Interactive Patient Education  2019 [employer].

## 2018-05-30 ENCOUNTER — Encounter: Payer: Self-pay | Admitting: Allergy

## 2018-05-30 ENCOUNTER — Telehealth: Payer: Self-pay

## 2018-05-30 ENCOUNTER — Encounter: Payer: Self-pay | Admitting: Family Medicine

## 2018-05-30 ENCOUNTER — Ambulatory Visit: Payer: Medicare Other | Admitting: Allergy

## 2018-05-30 ENCOUNTER — Other Ambulatory Visit: Payer: Self-pay

## 2018-05-30 VITALS — BP 122/70 | HR 69 | Temp 98.2°F | Resp 16 | Ht 62.99 in | Wt 158.4 lb

## 2018-05-30 DIAGNOSIS — L509 Urticaria, unspecified: Secondary | ICD-10-CM

## 2018-05-30 DIAGNOSIS — L501 Idiopathic urticaria: Secondary | ICD-10-CM

## 2018-05-30 MED ORDER — OMALIZUMAB 150 MG ~~LOC~~ SOLR
300.0000 mg | SUBCUTANEOUS | Status: DC
  Administered 2018-07-02 – 2019-12-18 (×18): 300 mg via SUBCUTANEOUS

## 2018-05-30 NOTE — Telephone Encounter (Signed)
Got it will reach out to patient to discuss affordability for Xolair

## 2018-05-30 NOTE — Addendum Note (Signed)
Addended by: Delsa Grana on: 05/30/2018 12:42 PM   Modules accepted: Level of Service

## 2018-05-30 NOTE — Patient Instructions (Addendum)
Urticaria Currently having a flare.  Start prednisone taper.   Start Xolair injections every 4 weeks.  I have prescribed epinephrine injectable and demonstrated proper use. For mild symptoms you can take over the counter antihistamines such as Benadryl and monitor symptoms closely. If symptoms worsen or if you have severe symptoms including breathing issues, throat closure, significant swelling, whole body hives, severe diarrhea and vomiting, lightheadedness then inject epinephrine and seek immediate medical care afterwards.   Continue zyrtec to 10mg  twice a day as long as it does not cause drowsiness.   Continue Pepcid 20mg  twice a day.  Continue Singulair 10mg  daily.  Only use hydroxyzine only if needed.  Follow up in 3 months or sooner if needed.

## 2018-05-30 NOTE — Progress Notes (Signed)
Immunotherapy   Patient Details  Name: Patricia Morton MRN: [id number] Date of Birth: 04/23/1926  05/30/2018  [name] started injections for  Xolair 300 mg. Following schedule:   Frequency: every 4 weeks Epi-Pen:Epi-Pen Available  Consent signed and patient instructions given.   [name] 05/30/2018, 11:23 AM

## 2018-05-30 NOTE — Telephone Encounter (Signed)
Noted  

## 2018-05-30 NOTE — Progress Notes (Signed)
Follow Up Note  RE: Patricia Morton MRN: [id number] DOB: 1926/03/19 Date of Office Visit: 05/30/2018  Referring provider: Delsa Grana, PA-C Primary care provider: Delsa Grana, PA-C  Chief Complaint: Urticaria (since November, comes and goes, prednisone helps, but then comes back) and Urticaria (hives were looked like blisters, went to PCP, got a steroid injection and some bendaryl)  History of Present Illness: I had the pleasure of seeing Patricia Morton for a follow up visit at the [facility] on 05/30/2018. She is a 85 y.o. female, who is being followed for urticaria. Today she is here for new complaint of worsening symptoms. She is accompanied today by her daughter who provided/contributed to the history. Her previous allergy office visit was on 04/18/2018 with Dr. Maudie Mercury.   Urticaria Patient had a flare a few weeks ago and took oral prednisone at home which helped but about 8 days afterwards the hives started back up again.   Went to see PCP yesterday and was given IM steroid injection. Still having full body hives.  Denies any changes in diet, medications, personal care products. Only steroids seem to work but has been gaining weight.   Currently on zyrtec 10mg  BID, Pepcid 20mg  BID and Singulair daily and hydroxyzine as needed with minimal benefit.   Assessment and Plan: Chauntel is a 85 y.o. female with: Urticaria Past history - 3 episodes of rash/hives flare up since November 2019. Initially it was thought to be due from her medication change to Celexa however had 2 flares since stopping Celexa. The prednisone and antihistamines do seem to help. Other than the above medication changes denies any changes in diet, personal care products, recent infections. She did have bloodwork which showed elevated TSH level and her synthroid dose was adjusted.  Interim history - Still having issues, major flare started last week after she finished prednisone taper. Had IM steroid injection  yesterday at PCP's office but full body hives today.  Start prednisone taper.   Start Xolair 300mg  injections every 4 weeks. Sample dose given in the office. No issues after 1 hour waiting period. Reviewed risks and benefits.   I have prescribed epinephrine injectable and demonstrated proper use. For mild symptoms you can take over the counter antihistamines such as Benadryl and monitor symptoms closely. If symptoms worsen or if you have severe symptoms including breathing issues, throat closure, significant swelling, whole body hives, severe diarrhea and vomiting, lightheadedness then inject epinephrine and seek immediate medical care afterwards.  Continue zyrtec to 10mg  twice a day as long as it does not cause drowsiness.   Continue Pepcid 20mg  twice a day.  Continue Singulair 10mg  daily.  Only use hydroxyzine only if needed.  Return in about 3 months (around 08/30/2018).  Meds ordered this encounter  Medications  . omalizumab Arvid Right) injection 300 mg    Diagnostics: None.   Medication List:  Current Outpatient Medications  Medication Sig Dispense Refill  . cetirizine (ZYRTEC) 10 MG tablet Take 1 tablet (10 mg total) by mouth 2 (two) times daily. 180 tablet 1  . EPINEPHrine 0.3 mg/0.3 mL IJ SOAJ injection Inject 0.3 mLs (0.3 mg total) into the muscle as needed for anaphylaxis. 1 Device 1  . escitalopram (LEXAPRO) 10 MG tablet Take 1 tablet (10 mg total) by mouth daily. 90 tablet 1  . famotidine (PEPCID) 20 MG tablet Take 1 tablet (20 mg total) by mouth 2 (two) times daily. For Hives 60 tablet 2  . hydrOXYzine (ATARAX/VISTARIL) 10  MG tablet TAKE 1 OR 2 TABLETS BY MOUTH THREE TIMES DAILY AS NEEDED FOR ITCHING 30 tablet 0  . levothyroxine (SYNTHROID, LEVOTHROID) 75 MCG tablet TAKE 1 TABLET BY MOUTH EVERY MORNING ON an EMPTY stomach. wait 45 minutes BEFORE eating 90 tablet 0  . metoprolol tartrate (LOPRESSOR) 50 MG tablet TAKE 1 TABLET BY MOUTH TWICE DAILY FOR BLOOD PRESSURE 180  tablet 0  . montelukast (SINGULAIR) 10 MG tablet Take 1 tablet (10 mg total) by mouth at bedtime. 90 tablet 1  . simvastatin (ZOCOR) 40 MG tablet TAKE 1 TABLET BY MOUTH AT BEDTIME 90 tablet 0  . triamterene-hydrochlorothiazide (MAXZIDE-25) 37.5-25 MG tablet TAKE 1/2 TABLET BY MOUTH EVERY MORNING 45 tablet 1   Current Facility-Administered Medications  Medication Dose Route Frequency Provider Last Rate Last Dose  . omalizumab Arvid Right) injection 300 mg  300 mg Subcutaneous Q28 days Garnet Sierras, DO       Allergies: Allergies  Allergen Reactions  . Celexa [Citalopram Hydrobromide] Hives   I reviewed her past medical history, social history, family history, and environmental history and no significant changes have been reported from previous visit on 04/18/2018.  Review of Systems  Constitutional: Negative for appetite change, chills, fever and unexpected weight change.  HENT: Negative for congestion and rhinorrhea.   Eyes: Negative for itching.  Respiratory: Negative for cough, chest tightness, shortness of breath and wheezing.   Cardiovascular: Negative for chest pain.  Gastrointestinal: Negative for abdominal pain.  Genitourinary: Negative for difficulty urinating.  Skin: Positive for rash.  Neurological: Negative for headaches.   Objective: BP 122/70   Pulse 69   Temp 98.2 F (36.8 C) (Oral)   Resp 16   Ht 5' 2.99" (1.6 m)   Wt 158 lb 6.4 oz (71.8 kg)   SpO2 96%   BMI 28.07 kg/m  Body mass index is 28.07 kg/m. Physical Exam  Constitutional: She is oriented to person, place, and time. She appears well-developed and well-nourished.  HENT:  Head: Normocephalic and atraumatic.  Eyes: Conjunctivae and EOM are normal.  Neck: Neck supple.  Cardiovascular: Normal rate, regular rhythm and normal heart sounds. Exam reveals no gallop and no friction rub.  No murmur heard. Pulmonary/Chest: Effort normal and breath sounds normal. She has no wheezes. She has no rales.  Abdominal:  Soft.  Neurological: She is alert and oriented to person, place, and time.  Skin: Skin is warm. Rash noted.  Diffuse urticarial rash on torso, upper extremities b/l.  Psychiatric: She has a normal mood and affect. Her behavior is normal.  Nursing note and vitals reviewed.  Previous notes and tests were reviewed. The plan was reviewed with the patient/family, and all questions/concerned were addressed.  It was my pleasure to see Raeann today and participate in her care. Please feel free to contact me with any questions or concerns.  Sincerely,  Rexene Alberts, DO Allergy & Immunology  [facility] office: [phone] [facility] office: [phone]

## 2018-05-30 NOTE — Assessment & Plan Note (Signed)
Past history - 3 episodes of rash/hives flare up since November 2019. Initially it was thought to be due from her medication change to Celexa however had 2 flares since stopping Celexa. The prednisone and antihistamines do seem to help. Other than the above medication changes denies any changes in diet, personal care products, recent infections. She did have bloodwork which showed elevated TSH level and her synthroid dose was adjusted.  Interim history - Still having issues, major flare started last week after she finished prednisone taper. Had IM steroid injection yesterday at PCP's office but full body hives today.  Start prednisone taper.   Start Xolair 300mg  injections every 4 weeks. Sample dose given in the office. No issues after 1 hour waiting period. Reviewed risks and benefits.   I have prescribed epinephrine injectable and demonstrated proper use. For mild symptoms you can take over the counter antihistamines such as Benadryl and monitor symptoms closely. If symptoms worsen or if you have severe symptoms including breathing issues, throat closure, significant swelling, whole body hives, severe diarrhea and vomiting, lightheadedness then inject epinephrine and seek immediate medical care afterwards.  Continue zyrtec to 10mg  twice a day as long as it does not cause drowsiness.   Continue Pepcid 20mg  twice a day.  Continue Singulair 10mg  daily.  Only use hydroxyzine only if needed.

## 2018-05-30 NOTE — Telephone Encounter (Signed)
Pt started Xolair given sample per Dr Maudie Mercury. Chronic urticaria 300 mg every 4 weeks. Please submit patient

## 2018-06-01 ENCOUNTER — Other Ambulatory Visit (INDEPENDENT_AMBULATORY_CARE_PROVIDER_SITE_OTHER): Payer: Medicare Other

## 2018-06-01 ENCOUNTER — Other Ambulatory Visit: Payer: Self-pay

## 2018-06-01 ENCOUNTER — Encounter: Payer: Self-pay | Admitting: Internal Medicine

## 2018-06-01 DIAGNOSIS — E039 Hypothyroidism, unspecified: Secondary | ICD-10-CM | POA: Diagnosis not present

## 2018-06-01 LAB — T4, FREE: Free T4: 1.23 ng/dL (ref 0.60–1.60)

## 2018-06-01 LAB — TSH: TSH: 1.13 u[IU]/mL (ref 0.35–4.50)

## 2018-06-11 ENCOUNTER — Other Ambulatory Visit: Payer: Self-pay | Admitting: Family Medicine

## 2018-06-11 DIAGNOSIS — L509 Urticaria, unspecified: Secondary | ICD-10-CM

## 2018-06-12 ENCOUNTER — Other Ambulatory Visit: Payer: Self-pay | Admitting: Allergy

## 2018-06-13 ENCOUNTER — Telehealth: Payer: Self-pay | Admitting: *Deleted

## 2018-06-13 MED ORDER — PREDNISONE 10 MG PO TABS: ORAL_TABLET | ORAL | 0 refills | Status: DC

## 2018-06-13 NOTE — Telephone Encounter (Signed)
Please call patient back.  Did the hives completely go away with xolair? How many days afterwards did they see improvement? When is the last date she took prednisone?  Is she taking all these medications as prescribed?  Continue zyrtec to 10mg  twice a day as long as it does not cause drowsiness.   Continue Pepcid20mg  twice a day.  ContinueSingulair10mg  daily.  Only use hydroxyzineonlyif needed.  Can you check if we have her Xolair in stock at the office?

## 2018-06-13 NOTE — Telephone Encounter (Signed)
Patient's daughter confirmed that patient is taking all of the medications as prescribed.

## 2018-06-13 NOTE — Telephone Encounter (Signed)
Patient's daughter called and stated that the patient is complaining that her hives are back and are as bad as ever. She does not feel the Hydroxyzine is helping. Is there anything else she can have to help her until her next Xolair? She did have some relief up until last Thursday.

## 2018-06-13 NOTE — Telephone Encounter (Signed)
Please tell her to continue all meds.  I sent in prednisone 10mg  tablets to take 2 tablets for 3 days and then 1 tablet for 4 days.  She can come in to get Xolair 300mg  next injection after 4/8 - which is 3 weeks from last injection. May get sample again if her Xolair is not in by then. Make sure they picked up the epipen script before coming.  Thank you.

## 2018-06-13 NOTE — Telephone Encounter (Signed)
Prednisone was given on 3/18. Hives completely went away until last Thursday. Submitted to [employer] yesterday for [employer] [facility].

## 2018-06-13 NOTE — Telephone Encounter (Signed)
Call to pt to inform her of above instructions.  Spoke with her daughter Patricia Morton, she will inform her mother on what to do.  Let her know prednisone was at the pharmacy and she can come in around 06/20/2018 for her next Xolair. Pt daughter states that she was able to obtain her Epipen.

## 2018-06-13 NOTE — Telephone Encounter (Signed)
Can you call patient to make sure she is taking her other medications - zyrtec, pepcid, singulair?  We can give a short course of prednisone until the Xolair arrives but I do not like to give so much prednisone due to side effects.

## 2018-06-19 DIAGNOSIS — L905 Scar conditions and fibrosis of skin: Secondary | ICD-10-CM | POA: Diagnosis not present

## 2018-06-19 DIAGNOSIS — D485 Neoplasm of uncertain behavior of skin: Secondary | ICD-10-CM | POA: Diagnosis not present

## 2018-06-19 DIAGNOSIS — L988 Other specified disorders of the skin and subcutaneous tissue: Secondary | ICD-10-CM | POA: Diagnosis not present

## 2018-06-24 ENCOUNTER — Other Ambulatory Visit: Payer: Self-pay | Admitting: Allergy

## 2018-06-24 DIAGNOSIS — L509 Urticaria, unspecified: Secondary | ICD-10-CM

## 2018-06-27 ENCOUNTER — Ambulatory Visit: Payer: Medicare Other

## 2018-07-02 ENCOUNTER — Other Ambulatory Visit: Payer: Self-pay

## 2018-07-02 ENCOUNTER — Ambulatory Visit (INDEPENDENT_AMBULATORY_CARE_PROVIDER_SITE_OTHER): Payer: Medicare Other

## 2018-07-02 DIAGNOSIS — L509 Urticaria, unspecified: Secondary | ICD-10-CM

## 2018-07-03 ENCOUNTER — Other Ambulatory Visit: Payer: Self-pay | Admitting: Family Medicine

## 2018-07-17 ENCOUNTER — Telehealth: Payer: Self-pay | Admitting: *Deleted

## 2018-07-17 NOTE — Telephone Encounter (Signed)
Patients daughter called today stating that her Mother's Xolair works well for the first 2 days then it does come back again. She has only received 2 injections thus far. Her next appt for her dose is 07/30/18. Patient's daughter was wondering if she can get her dose earlier this Friday. Called and consulted with Tammy and she explained that we would not be able to give her the dose early due to the program that [place] has her enrolled in so that she can receive her Xolair for free. Tammy did state that it would at least take 3 doses to start seeing better results if Xolair is actually working for her. Patient's daughter did state that due to her age she does not want her to receive more Prednisone to manage her urticaria. Called and left detailed voicemail per DPR admission letting daughter know everything that was stated by Tammy and that I would call her back once heard back from Dr. Maudie Mercury on what we could possibly do. Patient's daughter did state that she is taking her other medications. Please advise.

## 2018-07-18 NOTE — Telephone Encounter (Signed)
Can someone call the patient and ask her to call next week to see if her Xolair got here. We may be able to give it next week. See Tammy's message below.

## 2018-07-18 NOTE — Telephone Encounter (Signed)
I can try to get her on every 3 weeks.  She is due for her 3rd injection in less than 2 weeks so probably can get her medication here around mid week next week if she wants to call Wednesday or after to see if it has arrived and come in a few days early

## 2018-07-18 NOTE — Telephone Encounter (Signed)
Spoke to pt daughter, and let her know the medication is in our office, the daughter wanted to know if she can come this Friday 07/20/18 because she's not doing well today, she's put on the schedule for Friday 07/19/17. Pt also said she didn't want prednisone pack due to her age.

## 2018-07-18 NOTE — Telephone Encounter (Signed)
Will the program allow the Xolair to be administered every 3 weeks?   Sometimes I have done that in the past. Make sure she is still taking her antihistamines on a daily basis.   Continue zyrtec to 10mg  twice a day as long as it does not cause drowsiness.   Continue Pepcid20mg  twice a day.  ContinueSingulair10mg  daily.

## 2018-07-20 ENCOUNTER — Ambulatory Visit: Payer: Medicare Other | Admitting: Internal Medicine

## 2018-07-20 ENCOUNTER — Other Ambulatory Visit: Payer: Self-pay

## 2018-07-20 ENCOUNTER — Ambulatory Visit (INDEPENDENT_AMBULATORY_CARE_PROVIDER_SITE_OTHER): Payer: Medicare Other | Admitting: *Deleted

## 2018-07-20 VITALS — BP 118/62 | HR 64 | Temp 98.6°F | Wt 159.8 lb

## 2018-07-20 DIAGNOSIS — L501 Idiopathic urticaria: Secondary | ICD-10-CM

## 2018-07-20 DIAGNOSIS — E039 Hypothyroidism, unspecified: Secondary | ICD-10-CM

## 2018-07-20 DIAGNOSIS — L509 Urticaria, unspecified: Secondary | ICD-10-CM

## 2018-07-20 LAB — TSH: TSH: 0.72 u[IU]/mL (ref 0.35–4.50)

## 2018-07-20 LAB — T4, FREE: Free T4: 1.13 ng/dL (ref 0.60–1.60)

## 2018-07-20 NOTE — Progress Notes (Signed)
Name: Patricia Morton  MRN/ DOB: [id number], 16-Oct-1926    Age/ Sex: 85 y.o., female     PCP: Patricia Grana, PA-C   Reason for Endocrinology Evaluation: Hypothyroidism      Initial Endocrinology Clinic Visit: 04/20/2018    PATIENT IDENTIFIER: Patricia Morton is a 85 y.o., female with a past medical history of Hypothyroidism, HTN, OA and dyslipidemia . She has followed with [facility] since 04/20/2018 for consultative assistance with management of her Hypothyroidism.   HISTORICAL SUMMARY: The patient was first diagnosed with hypothyroidism over 35 yrs ago. Over the years her dose has been adjusted multiple times due to difficult to control TSH. Dose ranging 50-88 mcg daily  SUBJECTIVE:   During last visit (04/20/2018): Continued Levothyroxine 75 mcg daily   Today (07/20/2018):  Patricia Morton is here for a 3 month follow up on hypothyroidism. She states compliance with Lt-4 replacement. She developed a pruritic rash since november 2019 , she is questioning if this is related to her LT-4 replacement.   She has chronic fatigue, she also has chronic insomnia but recently has been taking medications to help her anxiety and help her sleep.  Denies constipation Has mild depression   No local neck symptoms.   ROS:  As per HPI.   HISTORY:  Past Medical History:  Past Medical History:  Diagnosis Date  . Anxiety   . Arthritis   . Cataract    surgically removed  . Hyperlipemia   . Hypertension   . Hypothyroid   . Urticaria    Past Surgical History:  Past Surgical History:  Procedure Laterality Date  . ABDOMINAL HYSTERECTOMY    . BREAST SURGERY     right breast -lumpectomy-benign  . CYSTECTOMY    . EYE SURGERY    . TOTAL KNEE ARTHROPLASTY Right 05/25/2015   Procedure: TOTAL KNEE ARTHROPLASTY;  Surgeon: Patricia Arabian, Morton;  Location: [facility];  Service: Orthopedics;  Laterality: Right;  . TOTAL KNEE ARTHROPLASTY Left 12/14/2015   Procedure: LEFT TOTAL KNEE ARTHROPLASTY;   Surgeon: Patricia Arabian, Morton;  Location: [facility];  Service: Orthopedics;  Laterality: Left;    Social History:  reports that she quit smoking about 16 years ago. Her smoking use included cigarettes. She started smoking about 40 years ago. She quit after 15.00 years of use. She has never used smokeless tobacco. She reports previous alcohol use. She reports that she does not use drugs. Family History:  Family History  Problem Relation Age of Onset  . Heart disease Brother   . Hypothyroidism Mother   . Hypotension Sister   . Allergic rhinitis Neg Hx   . Asthma Neg Hx   . Eczema Neg Hx   . Urticaria Neg Hx      HOME MEDICATIONS: Allergies as of 07/20/2018      Reactions   Celexa [citalopram Hydrobromide] Hives      Medication List       Accurate as of Jul 20, 2018 11:34 AM. If you have any questions, ask your nurse or doctor.        STOP taking these medications   predniSONE 10 MG tablet Commonly known as:  DELTASONE Stopped by:  Patricia Morton     TAKE these medications   cetirizine 10 MG tablet Commonly known as:  ZYRTEC Take 1 tablet (10 mg total) by mouth 2 (two) times daily.   EPINEPHrine 0.3 mg/0.3 mL Soaj injection Commonly known as:  EPI-PEN Inject 0.3 mLs (  0.3 mg total) into the muscle as needed for anaphylaxis.   escitalopram 10 MG tablet Commonly known as:  Lexapro Take 1 tablet (10 mg total) by mouth daily.   famotidine 20 MG tablet Commonly known as:  PEPCID TAKE ONE CAPSULE BY MOUTH TWICE DAILY FOR HIVES   hydrOXYzine 10 MG tablet Commonly known as:  ATARAX/VISTARIL TAKE 1 OR 2 TABLETS BY MOUTH THREE TIMES DAILY AS NEEDED FOR ITCHING   levothyroxine 75 MCG tablet Commonly known as:  SYNTHROID TAKE 1 TABLET BY MOUTH EVERY MORNING ON an EMPTY stomach. wait 45 minutes BEFORE eating   metoprolol tartrate 50 MG tablet Commonly known as:  LOPRESSOR TAKE 1 TABLET BY MOUTH TWICE DAILY FOR BLOOD PRESSURE   montelukast 10 MG tablet Commonly known  as:  SINGULAIR TAKE 1 TABLET BY MOUTH AT BEDTIME   simvastatin 40 MG tablet Commonly known as:  ZOCOR TAKE 1 TABLET BY MOUTH AT BEDTIME   triamterene-hydrochlorothiazide 37.5-25 MG tablet Commonly known as:  MAXZIDE-25 TAKE 1/2 TABLET BY MOUTH EVERY MORNING         OBJECTIVE:   PHYSICAL EXAM: VS: BP 118/62 (BP Location: Right Arm, Patient Position: Sitting, Cuff Size: Normal)   Pulse 64   Temp 98.6 F (37 C) (Oral)   Wt 159 lb 12.8 oz (72.5 kg)   BMI 28.31 kg/m    EXAM: General: Pt appears well and is in NAD  Neck: General: Supple without adenopathy. Thyroid: Thyroid size normal.  No goiter or nodules appreciated. No thyroid bruit.  Lungs: Clear with good BS bilat with no rales, rhonchi, or wheezes  Heart: Auscultation: RRR.  Abdomen: Normoactive bowel sounds, soft, nontender, without masses or organomegaly palpable  Extremities:  BL LE: No pretibial edema normal ROM and strength.  Skin: Hair: Texture and amount normal with gender appropriate distribution Skin Inspection: scattered hives on the back, extremities and trunk Skin Palpation: Skin temperature, texture, and thickness normal to palpation  Neuro: Cranial nerves: II - XII grossly intact  Motor: Normal strength throughout DTRs: 2+ and symmetric in UE without delay in relaxation phase  Mental Status: Judgment, insight: Intact Orientation: Oriented to time, place, and person Mood and affect: No depression, anxiety, or agitation     DATA REVIEWED: Results for KEENAN, DIMITROV (MRN [id number]) as of 07/23/2018 07:07  Ref. Range 07/20/2018 09:57  TSH Latest Ref Range: 0.35 - 4.50 uIU/mL 0.72  T4,Free(Direct) Latest Ref Range: 0.60 - 1.60 ng/dL 1.13     ASSESSMENT / PLAN / RECOMMENDATIONS:   1. Hypothyroidism:  - Clinically euthyroid - No local neck symptoms.  - Pt educated extensively on the correct way to take levothyroxine (first thing in the morning with water, 30 minutes before eating or taking other  medications). - Pt encouraged to double dose the following day if she were to miss a dose given long half-life of levothyroxine. - Per daughter patient had a total of 5 prednisone courses in the past few months, the last one ~ 1 month ago, I explained to them that prednisone will affect TFT and will not be making any changes at this time.   Medications  Levothyroxine 75 mcg daily    2. Idiopathic Hives:   - Pt was started on the current dose of levothyroxine 75 mcg daily in 03/2018, hives started in 01/2018.  - Reassurance provided at this time - She was advised in addition to continuing the medications provided by her allergist , to use calamine lotion as there's evidence of  excoriation marks .     F/u in 1 yr    Signed electronically by: Mack Guise, Morton  [facility]  [address] [place], [place] [address] Phone: [phone] FAX: [phone]      CC: Danna Hefty [facility] [address] [place] [address] Phone: [phone]  Fax: [phone]   Return to Endocrinology clinic as below: Future Appointments  Date Time Provider [place]  08/17/2018 11:30 AM [facility] NURSE [facility] None  08/29/2018 11:00 AM Garnet Sierras, DO [facility] None  07/22/2019  9:30 AM Elfida Shimada, Melanie Crazier, Morton [facility] None

## 2018-07-20 NOTE — Patient Instructions (Signed)

## 2018-07-23 ENCOUNTER — Encounter: Payer: Self-pay | Admitting: Internal Medicine

## 2018-07-30 ENCOUNTER — Ambulatory Visit: Payer: Self-pay

## 2018-08-14 ENCOUNTER — Telehealth: Payer: Self-pay | Admitting: *Deleted

## 2018-08-14 DIAGNOSIS — L509 Urticaria, unspecified: Secondary | ICD-10-CM

## 2018-08-14 NOTE — Telephone Encounter (Signed)
Patient's daughter called and stated that patient would like to get tested for dog via blood work to see if this may be contributing to her hives. Please advise.

## 2018-08-15 NOTE — Telephone Encounter (Signed)
Can you please order area with zone 2 and tell patient to get bloodwork when she is able to. Does not need to be fasting or off any medications.  Thank you.

## 2018-08-15 NOTE — Telephone Encounter (Signed)
I have placed the order and put in the lab for Patricia Morton. I spoke with Epifania Gore daughter, and informed her of the need for labs. They will be here Friday for an injection so will get the labs drawn at that time.

## 2018-08-15 NOTE — Addendum Note (Signed)
Addended by: Lucrezia Starch I on: 08/15/2018 08:49 AM   Modules accepted: Orders

## 2018-08-17 ENCOUNTER — Ambulatory Visit (INDEPENDENT_AMBULATORY_CARE_PROVIDER_SITE_OTHER): Payer: Medicare Other

## 2018-08-17 ENCOUNTER — Other Ambulatory Visit: Payer: Self-pay

## 2018-08-17 DIAGNOSIS — L509 Urticaria, unspecified: Secondary | ICD-10-CM

## 2018-08-17 DIAGNOSIS — L501 Idiopathic urticaria: Secondary | ICD-10-CM | POA: Diagnosis not present

## 2018-08-20 LAB — ALLERGENS W/TOTAL IGE AREA 2

## 2018-08-23 ENCOUNTER — Other Ambulatory Visit: Payer: Self-pay | Admitting: Family Medicine

## 2018-08-26 ENCOUNTER — Other Ambulatory Visit: Payer: Self-pay | Admitting: Allergy

## 2018-08-26 ENCOUNTER — Other Ambulatory Visit: Payer: Self-pay | Admitting: Family Medicine

## 2018-08-26 DIAGNOSIS — E039 Hypothyroidism, unspecified: Secondary | ICD-10-CM

## 2018-08-26 DIAGNOSIS — L509 Urticaria, unspecified: Secondary | ICD-10-CM

## 2018-08-29 ENCOUNTER — Ambulatory Visit: Payer: Medicare Other | Admitting: Allergy

## 2018-08-29 ENCOUNTER — Encounter: Payer: Self-pay | Admitting: Allergy

## 2018-08-29 ENCOUNTER — Ambulatory Visit (INDEPENDENT_AMBULATORY_CARE_PROVIDER_SITE_OTHER): Payer: Medicare Other | Admitting: Allergy

## 2018-08-29 ENCOUNTER — Other Ambulatory Visit: Payer: Self-pay

## 2018-08-29 DIAGNOSIS — L509 Urticaria, unspecified: Secondary | ICD-10-CM

## 2018-08-29 NOTE — Progress Notes (Signed)
RE: Patricia Morton MRN: [id number] DOB: Nov 26, 1926 Date of Telemedicine Visit: 08/29/2018  Referring provider: Delsa Grana, PA-C Primary care provider: Delsa Grana, PA-C  Chief Complaint: Urticaria   Telemedicine Follow Up Visit via Telephone: I connected with Patricia Morton for a follow up on 08/29/18 by telephone and verified that I am speaking with the correct person using two identifiers.   I discussed the limitations, risks, security and privacy concerns of performing an evaluation and management service by telephone and the availability of in person appointments. I also discussed with the patient that there may be a patient responsible charge related to this service. The patient expressed understanding and agreed to proceed.  Patient is at home accompanied by daughter who provided/contributed to the history.  Provider is at the office.  Visit start time: 11:30AM Visit end time: 11:45AM Insurance consent/check in by: front desk Medical consent and medical assistant/nurse: Rickard Rhymes.  History of Present Illness: She is a 85 y.o. female, who is being followed for urticaria. Her previous allergy office visit was on 05/30/2018 with Dr. Maudie Morton. Today is a regular follow up visit.  Urticaria Currently on Xolair 300mg  every 3 weeks because getting a few hives at the end of second week/early third week. This is better than every 4 week regimen.  Not needed anymore prednisone.  Still taking zyrtec 10mg  BID, Pepcid 20mg  BID, Singulair at night. Weaned off hydroxyzine and benadryl.   Being followed by endocrinology for her thyroid issues.   Assessment and Plan: Patricia Morton is a 85 y.o. female with: Urticaria Past history - 3 episodes of rash/hives flare up since November 2019. Initially it was thought to be due from her medication change to Celexa however had 2 flares since stopping Celexa. The prednisone and antihistamines do seem to help. Other than the above medication changes denies any changes  in diet, personal care products, recent infections. She did have bloodwork which showed elevated TSH level and her synthroid dose was adjusted. 2020 environmental allergy panel was negative.  Interim history - Much improved with Xolair but breaking out in 2nd/3rd week slightly.  Continue Xolair injections every 3 weeks. Monitor symptoms.   Continue zyrtec to 10mg  twice a day as long as it does not cause drowsiness.   Continue Pepcid20mg  twice a day.  ContinueSingulair10mg  daily.  Only use hydroxyzineif needed.  Once hive free for at least 1 month then we can discuss tapering off the oral medications.   Return in about 3 months (around 11/29/2018).  Diagnostics: None.  Medication List:  Current Outpatient Medications  Medication Sig Dispense Refill   calcium carbonate (CALCIUM 600) 1500 (600 Ca) MG TABS tablet Take by mouth 2 (two) times daily with a meal.     cetirizine (ZYRTEC) 10 MG tablet TAKE 1 TABLET BY MOUTH EVERY DAY 30 tablet 0   EPINEPHrine 0.3 mg/0.3 mL IJ SOAJ injection Inject 0.3 mLs (0.3 mg total) into the muscle as needed for anaphylaxis. 1 Device 1   escitalopram (LEXAPRO) 10 MG tablet TAKE 1 TABLET BY MOUTH EVERY DAY 90 tablet 1   famotidine (PEPCID) 20 MG tablet TAKE ONE CAPSULE BY MOUTH TWICE DAILY FOR HIVES 60 tablet 5   hydrOXYzine (ATARAX/VISTARIL) 10 MG tablet TAKE 1 OR 2 TABLETS BY MOUTH THREE TIMES DAILY AS NEEDED FOR ITCHING 270 tablet 1   levothyroxine (SYNTHROID) 75 MCG tablet TAKE 1 TABLET BY MOUTH EVERY MORNING ON AN EMPTY STOMACH - WAIT 45 MINUTES BEFORE EATING 90 tablet 1   metoprolol  tartrate (LOPRESSOR) 50 MG tablet TAKE 1 TABLET BY MOUTH TWICE DAILY FOR BLOOD PRESSURE 180 tablet 0   montelukast (SINGULAIR) 10 MG tablet TAKE 1 TABLET BY MOUTH AT BEDTIME 90 tablet 1   simvastatin (ZOCOR) 40 MG tablet TAKE 1 TABLET BY MOUTH AT BEDTIME 90 tablet 0   triamterene-hydrochlorothiazide (MAXZIDE-25) 37.5-25 MG tablet TAKE 1/2 TABLET BY MOUTH  EVERY MORNING 45 tablet 1   Current Facility-Administered Medications  Medication Dose Route Frequency Provider Last Rate Last Dose   omalizumab Arvid Right) injection 300 mg  300 mg Subcutaneous Q28 days Garnet Sierras, DO   300 mg at 08/17/18 1208   Allergies: Allergies  Allergen Reactions   Celexa [Citalopram Hydrobromide] Hives   I reviewed her past medical history, social history, family history, and environmental history and no significant changes have been reported from previous visit on 05/30/2018.  Review of Systems  Constitutional: Negative for appetite change, chills, fever and unexpected weight change.  HENT: Negative for congestion and rhinorrhea.   Eyes: Negative for itching.  Respiratory: Negative for cough, chest tightness, shortness of breath and wheezing.   Cardiovascular: Negative for chest pain.  Gastrointestinal: Negative for abdominal pain.  Genitourinary: Negative for difficulty urinating.  Skin: Positive for rash.  Neurological: Negative for headaches.   Objective: Physical Exam   Not obtained as encounter was done via telephone.   Previous notes and tests were reviewed.  I discussed the assessment and treatment plan with the patient. The patient was provided an opportunity to ask questions and all were answered. The patient agreed with the plan and demonstrated an understanding of the instructions. After visit summary/patient instructions available via mail.   The patient was advised to call back or seek an in-person evaluation if the symptoms worsen or if the condition fails to improve as anticipated.  I provided 15 minutes of non-face-to-face time during this encounter.  It was my pleasure to participate in [place] care today. Please feel free to contact me with any questions or concerns.   Sincerely,  Rexene Alberts, DO Allergy & Immunology  [facility] office: [phone] [facility] office: [phone]

## 2018-08-29 NOTE — Patient Instructions (Addendum)
Urticaria  Continue Xolair injections every 3 weeks. Monitor symptoms.    Continue zyrtec to 10mg  twice a day as long as it does not cause drowsiness.   Continue Pepcid20mg  twice a day.  ContinueSingulair10mg  daily.  Only use hydroxyzineif needed.  Once hive free for at least 1 month then we can discuss tapering off the oral medications.   Follow up in 3 months or sooner if needed.

## 2018-08-29 NOTE — Assessment & Plan Note (Signed)
Past history - 3 episodes of rash/hives flare up since November 2019. Initially it was thought to be due from her medication change to Celexa however had 2 flares since stopping Celexa. The prednisone and antihistamines do seem to help. Other than the above medication changes denies any changes in diet, personal care products, recent infections. She did have bloodwork which showed elevated TSH level and her synthroid dose was adjusted. 2020 environmental allergy panel was negative.  Interim history - Much improved with Xolair but breaking out in 2nd/3rd week slightly.  Continue Xolair injections every 3 weeks. Monitor symptoms.   Continue zyrtec to 10mg  twice a day as long as it does not cause drowsiness.   Continue Pepcid20mg  twice a day.  ContinueSingulair10mg  daily.  Only use hydroxyzineif needed.  Once hive free for at least 1 month then we can discuss tapering off the oral medications.

## 2018-09-18 DIAGNOSIS — L905 Scar conditions and fibrosis of skin: Secondary | ICD-10-CM | POA: Diagnosis not present

## 2018-09-21 ENCOUNTER — Ambulatory Visit: Payer: Self-pay

## 2018-09-23 ENCOUNTER — Other Ambulatory Visit: Payer: Self-pay | Admitting: Family Medicine

## 2018-09-25 ENCOUNTER — Other Ambulatory Visit: Payer: Self-pay

## 2018-09-25 ENCOUNTER — Ambulatory Visit (INDEPENDENT_AMBULATORY_CARE_PROVIDER_SITE_OTHER): Payer: Medicare Other | Admitting: *Deleted

## 2018-09-25 DIAGNOSIS — L501 Idiopathic urticaria: Secondary | ICD-10-CM | POA: Diagnosis not present

## 2018-09-25 DIAGNOSIS — L509 Urticaria, unspecified: Secondary | ICD-10-CM

## 2018-10-16 ENCOUNTER — Ambulatory Visit: Payer: Self-pay

## 2018-10-21 ENCOUNTER — Other Ambulatory Visit: Payer: Self-pay | Admitting: Allergy

## 2018-10-21 DIAGNOSIS — L509 Urticaria, unspecified: Secondary | ICD-10-CM

## 2018-10-22 ENCOUNTER — Other Ambulatory Visit: Payer: Self-pay | Admitting: Family Medicine

## 2018-10-22 DIAGNOSIS — L509 Urticaria, unspecified: Secondary | ICD-10-CM

## 2018-10-23 ENCOUNTER — Ambulatory Visit (INDEPENDENT_AMBULATORY_CARE_PROVIDER_SITE_OTHER): Payer: Medicare Other | Admitting: *Deleted

## 2018-10-23 DIAGNOSIS — L501 Idiopathic urticaria: Secondary | ICD-10-CM | POA: Diagnosis not present

## 2018-11-13 ENCOUNTER — Ambulatory Visit: Payer: Medicare Other

## 2018-11-22 ENCOUNTER — Ambulatory Visit (INDEPENDENT_AMBULATORY_CARE_PROVIDER_SITE_OTHER): Payer: Medicare Other | Admitting: *Deleted

## 2018-11-22 ENCOUNTER — Other Ambulatory Visit: Payer: Self-pay | Admitting: Allergy

## 2018-11-22 ENCOUNTER — Other Ambulatory Visit: Payer: Self-pay | Admitting: Family Medicine

## 2018-11-22 DIAGNOSIS — L501 Idiopathic urticaria: Secondary | ICD-10-CM

## 2018-11-22 DIAGNOSIS — L509 Urticaria, unspecified: Secondary | ICD-10-CM

## 2018-11-23 ENCOUNTER — Other Ambulatory Visit: Payer: Self-pay | Admitting: Family Medicine

## 2018-11-23 DIAGNOSIS — L509 Urticaria, unspecified: Secondary | ICD-10-CM

## 2018-12-03 ENCOUNTER — Other Ambulatory Visit: Payer: Self-pay

## 2018-12-03 ENCOUNTER — Ambulatory Visit (INDEPENDENT_AMBULATORY_CARE_PROVIDER_SITE_OTHER): Payer: Medicare Other | Admitting: Allergy

## 2018-12-03 ENCOUNTER — Encounter: Payer: Self-pay | Admitting: Allergy

## 2018-12-03 VITALS — BP 136/68 | HR 60 | Temp 97.3°F | Resp 18

## 2018-12-03 DIAGNOSIS — L509 Urticaria, unspecified: Secondary | ICD-10-CM | POA: Diagnosis not present

## 2018-12-03 NOTE — Progress Notes (Signed)
Follow Up Note  RE: Patricia BOHLS MRN: [id number] DOB: 05/07/1926 Date of Office Visit: 12/03/2018  Referring provider: Delsa Grana, PA-C Primary care provider: Alycia Rossetti, MD  Chief Complaint: Urticaria (doing much better since last visit. no break outs since starting her injections. )  History of Present Illness: I had the pleasure of seeing Patricia Morton for a follow up visit at the [facility] on 12/03/2018. She is a 85 y.o. female, who is being followed for urticaria. Today she is here for regular follow up visit. Her previous allergy office visit was on 08/29/2018 with Dr. Maudie Mercury via telemedicine.   Urticaria Started on Xolair injections in April and for the past 2 months she had no issues. No pruritis or itching. She has been receiving Xolair 300mg  every 4 weeks.  Still taking zyrtec 10mg  Bid and Pepcid 20mg  BID. Stopped Singulair about 1 weeks ago as she was concerned about the side effects.   Assessment and Plan: [name] is a 85 y.o. female with: Urticaria Past history - 3 episodes of rash/hives flare up since November 2019. Initially it was thought to be due from her medication change to Celexa however had 2 flares since stopping Celexa. The prednisone and antihistamines do seem to help. Other than the above medication changes denies any changes in diet, personal care products, recent infections. She did have bloodwork which showed elevated TSH level and her synthroid dose was adjusted. 2020 environmental allergy panel was negative.  Interim history - No hives for the past 2 months with Xolair 300mg  SQ Q4 week dosing. Stopped Singulair 1 week ago due to concerns for side effects. No worsening rash/hives.   Continue Xolair injections every 4 weeks. Monitor symptoms.   Continuezyrtec 10mg  twice a day.  Decrease pepcid 20mg  to once a day. If no hives/itching for 2 weeks then:  Stop Pepcid completely and continue zyrtec 10mg  twice a day. If no hives/itching for  2 weeks then:  Decrease zyrtec to 10mg  once a day. If no hives/itching for 2 weeks then:  Stop zyrtec completely.  If you have any issues with itching/rash then may restart zyrtec and Pepcid.  Return in about 3 months (around 03/04/2019).  Diagnostics: None.   Medication List:  Current Outpatient Medications  Medication Sig Dispense Refill  . calcium carbonate (CALCIUM 600) 1500 (600 Ca) MG TABS tablet Take by mouth 2 (two) times daily with a meal.    . cetirizine (ZYRTEC) 10 MG tablet Take 10 mg by mouth daily.    Marland Kitchen EPINEPHrine 0.3 mg/0.3 mL IJ SOAJ injection Inject 0.3 mLs (0.3 mg total) into the muscle as needed for anaphylaxis. 1 Device 1  . escitalopram (LEXAPRO) 10 MG tablet TAKE 1 TABLET BY MOUTH EVERY DAY 90 tablet 1  . famotidine (PEPCID) 20 MG tablet TAKE 1 TABLET (20 MG TOTAL) BY MOUTH 2 (TWO) TIMES DAILY. FOR HIVES 180 tablet 0  . hydrOXYzine (ATARAX/VISTARIL) 10 MG tablet TAKE 1 OR 2 TABLETS BY MOUTH THREE TIMES DAILY AS NEEDED FOR ITCHING 270 tablet 1  . levothyroxine (SYNTHROID) 75 MCG tablet TAKE 1 TABLET BY MOUTH EVERY MORNING ON AN EMPTY STOMACH - WAIT 45 MINUTES BEFORE EATING 90 tablet 1  . metoprolol tartrate (LOPRESSOR) 50 MG tablet TAKE 1 TABLET BY MOUTH TWICE DAILY FOR BLOOD PRESSURE 180 tablet 1  . simvastatin (ZOCOR) 40 MG tablet TAKE 1 TABLET BY MOUTH AT BEDTIME 90 tablet 0  . triamterene-hydrochlorothiazide (MAXZIDE-25) 37.5-25 MG tablet TAKE 1/2  TABLET BY MOUTH EVERY MORNING 45 tablet 1   Current Facility-Administered Medications  Medication Dose Route Frequency Provider Last Rate Last Dose  . omalizumab Arvid Right) injection 300 mg  300 mg Subcutaneous Q28 days Garnet Sierras, DO   300 mg at 11/22/18 1847   Allergies: Allergies  Allergen Reactions  . Celexa [Citalopram Hydrobromide] Hives   I reviewed her past medical history, social history, family history, and environmental history and no significant changes have been reported from previous visit on  08/29/2018.  Review of Systems  Constitutional: Negative for appetite change, chills, fever and unexpected weight change.  HENT: Negative for congestion and rhinorrhea.   Eyes: Negative for itching.  Respiratory: Negative for cough, chest tightness, shortness of breath and wheezing.   Cardiovascular: Negative for chest pain.  Gastrointestinal: Negative for abdominal pain.  Genitourinary: Negative for difficulty urinating.  Skin: Negative for rash.  Neurological: Negative for headaches.   Objective: BP 136/68 (BP Location: Left Arm, Patient Position: Sitting, Cuff Size: Normal)   Pulse 60   Temp (!) 97.3 F (36.3 C) (Temporal)   Resp 18   SpO2 97%  There is no height or weight on file to calculate BMI. Physical Exam  Constitutional: She is oriented to person, place, and time. She appears well-developed and well-nourished.  HENT:  Head: Normocephalic and atraumatic.  Eyes: Conjunctivae and EOM are normal.  Neck: Neck supple.  Cardiovascular: Normal rate, regular rhythm and normal heart sounds. Exam reveals no gallop and no friction rub.  No murmur heard. Pulmonary/Chest: Effort normal and breath sounds normal. She has no wheezes. She has no rales.  Abdominal: Soft.  Neurological: She is alert and oriented to person, place, and time.  Skin: Skin is warm. No rash noted.  Psychiatric: She has a normal mood and affect. Her behavior is normal.  Nursing note and vitals reviewed.  Previous notes and tests were reviewed. The plan was reviewed with the patient/family, and all questions/concerned were addressed.  It was my pleasure to see Patricia Morton today and participate in her care. Please feel free to contact me with any questions or concerns.  Sincerely,  Rexene Alberts, DO Allergy & Immunology  [facility] office: [phone] [facility] office: [place] office: [phone]

## 2018-12-03 NOTE — Assessment & Plan Note (Signed)
Past history - 3 episodes of rash/hives flare up since November 2019. Initially it was thought to be due from her medication change to Celexa however had 2 flares since stopping Celexa. The prednisone and antihistamines do seem to help. Other than the above medication changes denies any changes in diet, personal care products, recent infections. She did have bloodwork which showed elevated TSH level and her synthroid dose was adjusted. 2020 environmental allergy panel was negative.  Interim history - No hives for the past 2 months with Xolair 300mg  SQ Q4 week dosing. Stopped Singulair 1 week ago due to concerns for side effects. No worsening rash/hives.   Continue Xolair injections every 4 weeks. Monitor symptoms.   Continuezyrtec 10mg  twice a day.  Decrease pepcid 20mg  to once a day. If no hives/itching for 2 weeks then:  Stop Pepcid completely and continue zyrtec 10mg  twice a day. If no hives/itching for 2 weeks then:  Decrease zyrtec to 10mg  once a day. If no hives/itching for 2 weeks then:  Stop zyrtec completely.  If you have any issues with itching/rash then may restart zyrtec and Pepcid.

## 2018-12-03 NOTE — Patient Instructions (Addendum)
Urticaria   Continue Xolair injections every 4 weeks. Monitor symptoms.   Continuezyrtec 10mg  twice a day.  Decrease pepcid 20mg  to once a day. If no hives/itching for 2 weeks then:  Stop Pepcid completely and continue zyrtec 10mg  twice a day. If no hives/itching for 2 weeks then:  Decrease zyrtec to 10mg  once a day. If no hives/itching for 2 weeks then:  Stop zyrtec completely.    If you have any issues with itching/rash then may restart zyrtec and Pepcid.  Follow up in 3 months or sooner if needed.

## 2018-12-13 ENCOUNTER — Ambulatory Visit: Payer: Medicare Other

## 2018-12-21 ENCOUNTER — Ambulatory Visit (INDEPENDENT_AMBULATORY_CARE_PROVIDER_SITE_OTHER): Payer: Medicare Other | Admitting: *Deleted

## 2018-12-21 ENCOUNTER — Ambulatory Visit: Payer: Self-pay | Admitting: *Deleted

## 2018-12-21 ENCOUNTER — Other Ambulatory Visit: Payer: Self-pay

## 2018-12-21 DIAGNOSIS — L501 Idiopathic urticaria: Secondary | ICD-10-CM

## 2018-12-21 DIAGNOSIS — L509 Urticaria, unspecified: Secondary | ICD-10-CM

## 2018-12-26 ENCOUNTER — Ambulatory Visit (INDEPENDENT_AMBULATORY_CARE_PROVIDER_SITE_OTHER): Payer: Medicare Other | Admitting: Family Medicine

## 2018-12-26 ENCOUNTER — Other Ambulatory Visit: Payer: Self-pay

## 2018-12-26 ENCOUNTER — Encounter: Payer: Self-pay | Admitting: Family Medicine

## 2018-12-26 VITALS — BP 126/82 | HR 68 | Temp 97.9°F | Resp 16 | Ht 63.0 in | Wt 159.0 lb

## 2018-12-26 DIAGNOSIS — F411 Generalized anxiety disorder: Secondary | ICD-10-CM

## 2018-12-26 DIAGNOSIS — I2510 Atherosclerotic heart disease of native coronary artery without angina pectoris: Secondary | ICD-10-CM | POA: Diagnosis not present

## 2018-12-26 DIAGNOSIS — R200 Anesthesia of skin: Secondary | ICD-10-CM

## 2018-12-26 DIAGNOSIS — R202 Paresthesia of skin: Secondary | ICD-10-CM

## 2018-12-26 DIAGNOSIS — E7800 Pure hypercholesterolemia, unspecified: Secondary | ICD-10-CM | POA: Diagnosis not present

## 2018-12-26 DIAGNOSIS — K30 Functional dyspepsia: Secondary | ICD-10-CM

## 2018-12-26 DIAGNOSIS — G4700 Insomnia, unspecified: Secondary | ICD-10-CM

## 2018-12-26 DIAGNOSIS — L509 Urticaria, unspecified: Secondary | ICD-10-CM

## 2018-12-26 DIAGNOSIS — M720 Palmar fascial fibromatosis [Dupuytren]: Secondary | ICD-10-CM

## 2018-12-26 DIAGNOSIS — I10 Essential (primary) hypertension: Secondary | ICD-10-CM

## 2018-12-26 MED ORDER — FAMOTIDINE 20 MG PO TABS: ORAL_TABLET | ORAL | 0 refills | Status: DC

## 2018-12-26 NOTE — Patient Instructions (Addendum)
Restart pepcid 20mg  once a day for indigestion For her tingling/numbness in hands, I am checking B12 level, other possibilities neuropathy associated with her contractures/arthritis, or carpal tunnel  If her B12 level is normal, I would like for her to see a hand specialist- we will set this up I dont want to put her on any nerve meds such as gabapentin just yet due to side effects and all her other meds  We will call with her lab results  F/U 4 months for wellness visit

## 2018-12-26 NOTE — Assessment & Plan Note (Addendum)
Blood pressure is controlled no change in medication.  He is followed by cardiology.  sHe is on statin drug.

## 2018-12-26 NOTE — Progress Notes (Signed)
Subjective:    Patient ID: Patricia Morton, female    DOB: Mar 03, 1927, 85 y.o.   MRN: [id number]  Patient presents for Numbness (x2 weeks- tingling and numbness to fingers and toes- also states that she has buring and pain in palms of hands) and GERD (states that she stopped the Pepcid on Sunday, but is having some increased indigestion)  Pt here to f/u chronic medical problems   She had had indigestion last few days.  She had stopped Pepcid which was given to her by her allergist for urticaria want to know she could resume this. Denies any vomiting episodes no change in her bowels.   Numbness in finger fingers , also feels a burning sensation in the palms of her hands for the past 2 weeks.  Her right hand is worse than the left.  She does not feel like she can tell on her chart or open a lid because of the change in sensation.  She also has significant contractures in her hand she has had surgery on her left hand she is not sure if this is contributing.  She also has arthritis. He does get some tingling in her toes but this is been going on for many years.  denies neck pain or injury    Hypothyrodism-   Followed by endocrinology    GAD- taking lexapro 10mg  she is sleeping well with this medication her anxiety is also controlled states that this one works for her.   HTN- taking meds as prescribed without any difficulty.   also seen by allergist for her urticaria- she is on immunotherapy with Xolair injections monthly   zyrtec 10mg  BID, singulair 10mg  and pepcid, prn atarax she has not taken the Atarax because her pharmacy alerted her that this is not safe for over 65   Review Of Systems:  GEN- denies fatigue, fever, weight loss,weakness, recent illness HEENT- denies eye drainage, change in vision, nasal discharge, CVS- denies chest pain, palpitations RESP- denies SOB, cough, wheeze ABD- denies N/V, change in stools, abd pain GU- denies dysuria, hematuria, dribbling, incontinence MSK- +  joint pain, muscle aches, injury Neuro- denies headache, dizziness, syncope, seizure activity       Objective:    BP 126/82   Pulse 68   Temp 97.9 F (36.6 C) (Oral)   Resp 16   Ht 5\' 3"  (1.6 m)   Wt 159 lb (72.1 kg)   SpO2 98%   BMI 28.17 kg/m  GEN- NAD, alert and oriented x3 HEENT- PERRL, EOMI, non injected sclera, pink conjunctiva, MMM, oropharynx clear Neck- Supple, no thyromegaly, fair ROM  CVS- RRR, no murmur RESP-CTAB ABD-NABS,soft,NT,ND MSK- bilat hand curvutrues and contractures, bilat 5th digits unable to extend, unable to make closed fist, decreased grasp bilat  Neuro- CNII-XII grossly in tact , normal monofilament hands ,mild decrease in toes bilat  Psych- normal affect and mood  EXT- No edema, varicose veins Pulses- Radial, DP- palpated         Assessment & Plan:      Problem List Items Addressed This Visit      Unprioritized   Anxiety disorder, unspecified    Doing well with Lexapro no changes.      Benign essential hypertension - Primary    Blood pressure is controlled no change in medication.  He is followed by cardiology.  sHe is on statin drug.      Relevant Orders   CBC with Differential/Platelet   Comprehensive metabolic panel  Coronary artery disease involving native heart   Dupuytren's contracture of both hands   Indigestion    Restart pepcid once a day       Insomnia   Pure hypercholesterolemia    Check her lipid panel.  Triglycerides may be little elevated she did eat breakfast.      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  But she does not want surgery   Relevant Orders   Vitamin B12      Note: This dictation was prepared with Dragon dictation along with smaller phrase technology. Any transcriptional errors that result from this process are unintentional.

## 2018-12-26 NOTE — Assessment & Plan Note (Signed)
Check her lipid panel.  Triglycerides may be little elevated she did eat breakfast.

## 2018-12-26 NOTE — Assessment & Plan Note (Signed)
Doing well with Lexapro no changes.

## 2018-12-26 NOTE — Assessment & Plan Note (Signed)
Restart pepcid once a day

## 2018-12-27 LAB — CBC WITH DIFFERENTIAL/PLATELET
Absolute Monocytes: 529 cells/uL (ref 200–950)
Basophils Absolute: 0 cells/uL (ref 0–200)
Basophils Relative: 0 %
Eosinophils Absolute: 158 cells/uL (ref 15–500)
Eosinophils Relative: 2.5 %
HCT: 42.8 % (ref 35.0–45.0)
Hemoglobin: 13.9 g/dL (ref 11.7–15.5)
Lymphs Abs: 1588 cells/uL (ref 850–3900)
MCH: 29.6 pg (ref 27.0–33.0)
MCHC: 32.5 g/dL (ref 32.0–36.0)
MCV: 91.3 fL (ref 80.0–100.0)
MPV: 9.8 fL (ref 7.5–12.5)
Monocytes Relative: 8.4 %
Neutro Abs: 4026 cells/uL (ref 1500–7800)
Neutrophils Relative %: 63.9 %
Platelets: 359 10*3/uL (ref 140–400)
RBC: 4.69 10*6/uL (ref 3.80–5.10)
RDW: 13.3 % (ref 11.0–15.0)
Total Lymphocyte: 25.2 %
WBC: 6.3 10*3/uL (ref 3.8–10.8)

## 2018-12-27 LAB — COMPREHENSIVE METABOLIC PANEL
AG Ratio: 1.6 (calc) (ref 1.0–2.5)
ALT: 11 U/L (ref 6–29)
AST: 18 U/L (ref 10–35)
Albumin: 3.9 g/dL (ref 3.6–5.1)
Alkaline phosphatase (APISO): 61 U/L (ref 37–153)
BUN: 12 mg/dL (ref 7–25)
CO2: 26 mmol/L (ref 20–32)
Calcium: 9.2 mg/dL (ref 8.6–10.4)
Chloride: 102 mmol/L (ref 98–110)
Creat: 0.84 mg/dL (ref 0.60–0.88)
Globulin: 2.4 g/dL (calc) (ref 1.9–3.7)
Glucose, Bld: 89 mg/dL (ref 65–99)
Potassium: 4.5 mmol/L (ref 3.5–5.3)
Sodium: 137 mmol/L (ref 135–146)
Total Bilirubin: 0.5 mg/dL (ref 0.2–1.2)
Total Protein: 6.3 g/dL (ref 6.1–8.1)

## 2018-12-27 LAB — LIPID PANEL
Cholesterol: 178 mg/dL (ref <200)
HDL: 68 mg/dL (ref >=50)
LDL Cholesterol (Calc): 86 mg/dL (calc)
Non-HDL Cholesterol (Calc): 110 mg/dL (calc) (ref <130)
Total CHOL/HDL Ratio: 2.6 (calc) (ref <5.0)
Triglycerides: 138 mg/dL (ref <150)

## 2018-12-27 LAB — VITAMIN B12: Vitamin B-12: 394 pg/mL (ref 200–1100)

## 2019-01-08 DIAGNOSIS — G5603 Carpal tunnel syndrome, bilateral upper limbs: Secondary | ICD-10-CM | POA: Diagnosis not present

## 2019-01-18 ENCOUNTER — Ambulatory Visit: Payer: Self-pay

## 2019-01-19 ENCOUNTER — Other Ambulatory Visit: Payer: Self-pay | Admitting: Family Medicine

## 2019-01-19 DIAGNOSIS — L509 Urticaria, unspecified: Secondary | ICD-10-CM

## 2019-01-25 ENCOUNTER — Ambulatory Visit: Payer: Self-pay

## 2019-01-29 ENCOUNTER — Ambulatory Visit (INDEPENDENT_AMBULATORY_CARE_PROVIDER_SITE_OTHER): Payer: Medicare Other | Admitting: *Deleted

## 2019-01-29 ENCOUNTER — Other Ambulatory Visit: Payer: Self-pay

## 2019-01-29 DIAGNOSIS — L501 Idiopathic urticaria: Secondary | ICD-10-CM

## 2019-01-29 DIAGNOSIS — L509 Urticaria, unspecified: Secondary | ICD-10-CM

## 2019-02-12 HISTORY — PX: HAND SURGERY: SHX662

## 2019-02-19 ENCOUNTER — Other Ambulatory Visit: Payer: Self-pay | Admitting: Family Medicine

## 2019-02-19 DIAGNOSIS — E039 Hypothyroidism, unspecified: Secondary | ICD-10-CM

## 2019-02-26 ENCOUNTER — Ambulatory Visit: Payer: Self-pay

## 2019-02-27 DIAGNOSIS — G5603 Carpal tunnel syndrome, bilateral upper limbs: Secondary | ICD-10-CM | POA: Diagnosis not present

## 2019-02-28 ENCOUNTER — Other Ambulatory Visit: Payer: Self-pay | Admitting: Family Medicine

## 2019-02-28 DIAGNOSIS — E039 Hypothyroidism, unspecified: Secondary | ICD-10-CM

## 2019-03-05 ENCOUNTER — Ambulatory Visit (INDEPENDENT_AMBULATORY_CARE_PROVIDER_SITE_OTHER): Payer: Medicare Other

## 2019-03-05 ENCOUNTER — Other Ambulatory Visit: Payer: Self-pay

## 2019-03-05 DIAGNOSIS — L509 Urticaria, unspecified: Secondary | ICD-10-CM

## 2019-03-05 DIAGNOSIS — G5603 Carpal tunnel syndrome, bilateral upper limbs: Secondary | ICD-10-CM | POA: Diagnosis not present

## 2019-03-05 DIAGNOSIS — L501 Idiopathic urticaria: Secondary | ICD-10-CM

## 2019-03-13 ENCOUNTER — Ambulatory Visit: Payer: Medicare Other | Admitting: Allergy

## 2019-03-13 DIAGNOSIS — G5601 Carpal tunnel syndrome, right upper limb: Secondary | ICD-10-CM | POA: Diagnosis not present

## 2019-03-13 NOTE — Progress Notes (Deleted)
Follow Up Note  RE: Patricia Morton MRN: [id number] DOB: 1927/03/08 Date of Office Visit: 03/13/2019  Referring provider: Alycia Rossetti, MD Primary care provider: Alycia Rossetti, MD  Chief Complaint: No chief complaint on file.  History of Present Illness: I had the pleasure of seeing Patricia Morton for a follow up visit at the [facility] on 03/13/2019. She is a 85 y.o. female, who is being followed for urticaria. Her previous allergy office visit was on 12/03/2018 with Dr. Maudie Mercury. Today is a regular follow up visit.  Urticaria Past history - 3 episodes of rash/hives flare up since November 2019. Initially it was thought to be due from her medication change to Celexa however had 2 flares since stopping Celexa. The prednisone and antihistamines do seem to help. Other than the above medication changes denies any changes in diet, personal care products, recent infections. She did have bloodwork which showed elevated TSH level and her synthroid dose was adjusted. 2020 environmental allergy panel was negative.  Interim history - No hives for the past 2 months with Xolair 300mg  SQ Q4 week dosing. Stopped Singulair 1 week ago due to concerns for side effects. No worsening rash/hives.   Continue Xolair injections every 4 weeks. Monitor symptoms.   Continuezyrtec 10mg  twice a day.  Decrease pepcid 20mg  to once a day. If no hives/itching for 2 weeks then: ? Stop Pepcid completely and continue zyrtec 10mg  twice a day. If no hives/itching for 2 weeks then:  Decrease zyrtec to 10mg  once a day. If no hives/itching for 2 weeks then:  Stop zyrtec completely.  If you have any issues with itching/rash then may restart zyrtec and Pepcid.  Return in about 3 months (around 03/04/2019).  Assessment and Plan: Patricia Morton is a 85 y.o. female with: No problem-specific Assessment & Plan notes found for this encounter.  No follow-ups on file.  No orders of the defined types were placed in  this encounter.  Lab Orders  No laboratory test(s) ordered today    Diagnostics: Spirometry:  Tracings reviewed. Her effort: {Blank single:19197::"Good reproducible efforts.","It was hard to get consistent efforts and there is a question as to whether this reflects a maximal maneuver.","Poor effort, data can not be interpreted."} FVC: ***L FEV1: ***L, ***% predicted FEV1/FVC ratio: ***% Interpretation: {Blank single:19197::"Spirometry consistent with mild obstructive disease","Spirometry consistent with moderate obstructive disease","Spirometry consistent with severe obstructive disease","Spirometry consistent with possible restrictive disease","Spirometry consistent with mixed obstructive and restrictive disease","Spirometry uninterpretable due to technique","Spirometry consistent with normal pattern","No overt abnormalities noted given today's efforts"}.  Please see scanned spirometry results for details.  Skin Testing: {Blank single:19197::"Select foods","Environmental allergy panel","Environmental allergy panel and select foods","Food allergy panel","None","Deferred due to recent antihistamines use"}. Positive test to: ***. Negative test to: ***.  Results discussed with patient/family.   Medication List:  Current Outpatient Medications  Medication Sig Dispense Refill  . calcium carbonate (CALCIUM 600) 1500 (600 Ca) MG TABS tablet Take by mouth 2 (two) times daily with a meal.    . cetirizine (ZYRTEC) 10 MG tablet Take 10 mg by mouth daily.    Marland Kitchen EPINEPHrine 0.3 mg/0.3 mL IJ SOAJ injection Inject 0.3 mLs (0.3 mg total) into the muscle as needed for anaphylaxis. 1 Device 1  . escitalopram (LEXAPRO) 10 MG tablet TAKE 1 TABLET BY MOUTH EVERY DAY 90 tablet 1  . famotidine (PEPCID) 20 MG tablet TAKE 1 TABLET (20 MG TOTAL) BY MOUTH 2 (TWO) TIMES DAILY. FOR HIVES 180 tablet 0  .  hydrOXYzine (ATARAX/VISTARIL) 10 MG tablet TAKE 1 OR 2 TABLETS BY MOUTH THREE TIMES DAILY AS NEEDED FOR ITCHING 270  tablet 1  . levothyroxine (SYNTHROID) 75 MCG tablet TAKE 1 TABLET BY MOUTH EVERY MORNING ON AN EMPTY STOMACH - WAIT 45 MINUTES BEFORE EATING 90 tablet 1  . metoprolol tartrate (LOPRESSOR) 50 MG tablet TAKE 1 TABLET BY MOUTH TWICE DAILY FOR BLOOD PRESSURE 180 tablet 1  . simvastatin (ZOCOR) 40 MG tablet TAKE 1 TABLET BY MOUTH AT BEDTIME 90 tablet 0  . triamterene-hydrochlorothiazide (MAXZIDE-25) 37.5-25 MG tablet TAKE 1/2 TABLET BY MOUTH EVERY MORNING 45 tablet 1   Current Facility-Administered Medications  Medication Dose Route Frequency Provider Last Rate Last Admin  . omalizumab Arvid Right) injection 300 mg  300 mg Subcutaneous Q28 days Garnet Sierras, DO   300 mg at 03/05/19 1346   Allergies: Allergies  Allergen Reactions  . Celexa [Citalopram Hydrobromide] Hives   I reviewed her past medical history, social history, family history, and environmental history and no significant changes have been reported from her previous visit.  Review of Systems  Constitutional: Negative for appetite change, chills, fever and unexpected weight change.  HENT: Negative for congestion and rhinorrhea.   Eyes: Negative for itching.  Respiratory: Negative for cough, chest tightness, shortness of breath and wheezing.   Cardiovascular: Negative for chest pain.  Gastrointestinal: Negative for abdominal pain.  Genitourinary: Negative for difficulty urinating.  Skin: Negative for rash.  Neurological: Negative for headaches.   Objective: There were no vitals taken for this visit. There is no height or weight on file to calculate BMI. Physical Exam  Constitutional: She is oriented to person, place, and time. She appears well-developed and well-nourished.  HENT:  Head: Normocephalic and atraumatic.  Eyes: Conjunctivae and EOM are normal.  Cardiovascular: Normal rate, regular rhythm and normal heart sounds. Exam reveals no gallop and no friction rub.  No murmur heard. Pulmonary/Chest: Effort normal and breath  sounds normal. She has no wheezes. She has no rales.  Abdominal: Soft.  Musculoskeletal:     Cervical back: Neck supple.  Neurological: She is alert and oriented to person, place, and time.  Skin: Skin is warm. No rash noted.  Psychiatric: She has a normal mood and affect. Her behavior is normal.  Nursing note and vitals reviewed.  Previous notes and tests were reviewed. The plan was reviewed with the patient/family, and all questions/concerned were addressed.  It was my pleasure to see Kaylanna today and participate in her care. Please feel free to contact me with any questions or concerns.  Sincerely,  Rexene Alberts, DO Allergy & Immunology  [facility] office: [phone] [facility] office: [place] office: [phone]

## 2019-03-20 ENCOUNTER — Other Ambulatory Visit: Payer: Self-pay | Admitting: Family Medicine

## 2019-03-22 ENCOUNTER — Other Ambulatory Visit: Payer: Self-pay | Admitting: Family Medicine

## 2019-03-22 DIAGNOSIS — L509 Urticaria, unspecified: Secondary | ICD-10-CM

## 2019-04-02 ENCOUNTER — Ambulatory Visit: Payer: Self-pay

## 2019-04-02 ENCOUNTER — Other Ambulatory Visit: Payer: Self-pay | Admitting: Family Medicine

## 2019-04-04 DIAGNOSIS — H0015 Chalazion left lower eyelid: Secondary | ICD-10-CM | POA: Diagnosis not present

## 2019-04-05 ENCOUNTER — Ambulatory Visit (INDEPENDENT_AMBULATORY_CARE_PROVIDER_SITE_OTHER): Payer: Medicare Other

## 2019-04-05 ENCOUNTER — Ambulatory Visit: Payer: Self-pay

## 2019-04-05 DIAGNOSIS — L501 Idiopathic urticaria: Secondary | ICD-10-CM

## 2019-04-30 ENCOUNTER — Encounter: Payer: Self-pay | Admitting: Family Medicine

## 2019-04-30 ENCOUNTER — Ambulatory Visit (INDEPENDENT_AMBULATORY_CARE_PROVIDER_SITE_OTHER): Payer: Medicare Other | Admitting: Family Medicine

## 2019-04-30 ENCOUNTER — Other Ambulatory Visit: Payer: Self-pay

## 2019-04-30 VITALS — BP 122/64 | HR 72 | Temp 97.9°F | Resp 14 | Ht 63.0 in | Wt 154.0 lb

## 2019-04-30 DIAGNOSIS — Z0000 Encounter for general adult medical examination without abnormal findings: Secondary | ICD-10-CM | POA: Diagnosis not present

## 2019-04-30 DIAGNOSIS — I10 Essential (primary) hypertension: Secondary | ICD-10-CM

## 2019-04-30 DIAGNOSIS — I2510 Atherosclerotic heart disease of native coronary artery without angina pectoris: Secondary | ICD-10-CM

## 2019-04-30 DIAGNOSIS — M159 Polyosteoarthritis, unspecified: Secondary | ICD-10-CM | POA: Diagnosis not present

## 2019-04-30 DIAGNOSIS — E441 Mild protein-calorie malnutrition: Secondary | ICD-10-CM

## 2019-04-30 DIAGNOSIS — F411 Generalized anxiety disorder: Secondary | ICD-10-CM

## 2019-04-30 DIAGNOSIS — K30 Functional dyspepsia: Secondary | ICD-10-CM

## 2019-04-30 DIAGNOSIS — E039 Hypothyroidism, unspecified: Secondary | ICD-10-CM

## 2019-04-30 DIAGNOSIS — Z66 Do not resuscitate: Secondary | ICD-10-CM

## 2019-04-30 DIAGNOSIS — E7800 Pure hypercholesterolemia, unspecified: Secondary | ICD-10-CM

## 2019-04-30 MED ORDER — CETIRIZINE HCL 10 MG PO TABS: 10.0000 mg | ORAL_TABLET | Freq: Every day | ORAL | 1 refills | Status: DC

## 2019-04-30 NOTE — Progress Notes (Signed)
Subjective:   Patient presents for Medicare Annual/Subsequent preventive examination.   Here for Medicare wellness visit.  Had a birthday last month she is currently 86 years old. She is accompanied by her daughter  She is followed by allergist for her chronic urticaria she is on immunotherapy with Xolair still.  She is also on Zyrtec and Singulair  Acid reflux she was restarted back on her Pepcid at the last visit her stomach is now back to baseline.  States that her appetite is good.  Her weight is down 4 pounds since her visit in October.  Her bowels are normal  Hypertension she is taking her blood pressure medicine without difficulty  Hypothyroidism this is managed by her endocrinologist, but she is worried about her hair thinning and would like to have her levels checked.  She does not have another appointment set up in the setting of COVID-19  Generalized  anxiety she has been doing well with Lexapro no concerns  Her last visit she was sent to hand surgeon because of her contractions of her hands as well as neuropathy symptoms diagnosis carpal tunnel syndrome she underwent carpal tunnel release back in December.  Still has numbness in her hands.  They cannot fix the contractures     Review Past Medical/Family/Social: Per EMR    Risk Factors  Current exercise habits: Tries to stay active Dietary issues discussed: No major concerns  Cardiac risk factors: CAD, HTN  Depression Screen  (Note: if answer to either of the following is "Yes", a more complete depression screening is indicated)  Over the past two weeks, have you felt down, depressed or hopeless? No Over the past two weeks, have you felt little interest or pleasure in doing things? No Have you lost interest or pleasure in daily life? No Do you often feel hopeless? No Do you cry easily over simple problems? No   Activities of Daily Living  In your present state of health, do you have any difficulty performing the  following activities?:  Driving? Yes Managing money? No  Feeding yourself? No  Getting from bed to chair? No  Climbing a flight of stairs? Yes  Preparing food and eating?: No  Bathing or showering? No  Getting dressed: No  Getting to the toilet? No  Using the toilet:No  Moving around from place to place: sometimes In the past year have you fallen or had a near fall?:No  Are you sexually active? No  Do you have more than one partner? No   Hearing Difficulties: No  Do you often ask people to speak up or repeat themselves? No  Do you experience ringing or noises in your ears? No Do you have difficulty understanding soft or whispered voices? No  Do you feel that you have a problem with memory? No Do you often misplace items? No  Do you feel safe at home? Yes  Cognitive Testing  Alert? Yes Normal Appearance?Yes  Oriented to person? Yes Place? Yes  Time? Yes  Recall of three objects? Yes  Can perform simple calculations? Yes  Displays appropriate judgment?Yes  Can read the correct time from a watch face?Yes   List the Names of Other Physician/Practitioners you currently use:  Allergy Endocrinology  Screening Tests / Date Colonoscopy    Over max age                  Shingrix UTD Mammogram  Pt declines further Bone density Influenza Vaccine UTD Pneumonia- UTD Tetanus/tdap UTD COVID 19  vaccine- Jan 15th ,  Feb  5th 2nd vaccine    ROS: GEN- denies fatigue, fever, weight loss,weakness, recent illness HEENT- denies eye drainage, change in vision, nasal discharge, CVS- denies chest pain, palpitations RESP- denies SOB, cough, wheeze ABD- denies N/V, change in stools, abd pain GU- denies dysuria, hematuria, dribbling, incontinence MSK- denies joint pain, muscle aches, injury Neuro- denies headache, dizziness, syncope, seizure activity   Physical- vitals reviewed  GEN- NAD, alert and oriented x3 HEENT- PERRL, EOMI, non injected sclera, pink conjunctiva, MMM, oropharynx  clear Neck- Supple, no thyromegaly, fair ROM  CVS- RRR, no murmur RESP-CTAB ABD-NABS,soft,NT,ND Psych- normal affect and mood  EXT- No edema, varicose veins Pulses- Radial, DP- palpated Assessment:    Annual wellness medicare exam   Plan:    During the course of the visit the patient was educated and counseled about appropriate screening and preventive services including:   Wellness visit- prevention UTD, over max age for mammo/bone density I see no benefit in obtaining these at this time   HTN- controlled  Hypothyriodism- recheck TFT, continue levothyroxine if abnormal, can coordinate with her endocrinologist   Acid indigestion improved with pepcid  GAD- continues to benefit from lexapro  CAD/Hyperlipidemia- tolerating zocor   Continue supplements, calcium and vitamin D    Generalized OA  Weight loss- mild protein calorie malnutrition overall has good appetite, some days does not eat as much, will continue to monitor, weight has fluctuated between  151-159lbs over the past year   Pt is DNR, she requested yellow DNR forms to have at home Daughter [name] is POA      CAGE/FALL/Depression screen neative  .  Diet review for nutrition referral? Yes ____ Not Indicated __x__  Patient Instructions (the written plan) was given to the patient.  Medicare Attestation  I have personally reviewed:  The patient's medical and social history  Their use of alcohol, tobacco or illicit drugs  Their current medications and supplements  The patient's functional ability including ADLs,fall risks, home safety risks, cognitive, and hearing and visual impairment  Diet and physical activities  Evidence for depression or mood disorders  The patient's weight, height, BMI, and visual acuity have been recorded in the chart. I have made referrals, counseling, and provided education to the patient based on review of the above and I have provided the patient with a written personalized care plan  for preventive services.

## 2019-04-30 NOTE — Patient Instructions (Signed)
F/u 4 months 

## 2019-05-01 LAB — T3, FREE: T3, Free: 3 pg/mL (ref 2.3–4.2)

## 2019-05-01 LAB — COMPREHENSIVE METABOLIC PANEL
AG Ratio: 1.8 (calc) (ref 1.0–2.5)
ALT: 12 U/L (ref 6–29)
AST: 18 U/L (ref 10–35)
Albumin: 4.2 g/dL (ref 3.6–5.1)
Alkaline phosphatase (APISO): 73 U/L (ref 37–153)
BUN/Creatinine Ratio: 13 (calc) (ref 6–22)
BUN: 12 mg/dL (ref 7–25)
CO2: 29 mmol/L (ref 20–32)
Calcium: 9.6 mg/dL (ref 8.6–10.4)
Chloride: 105 mmol/L (ref 98–110)
Creat: 0.89 mg/dL — ABNORMAL HIGH (ref 0.60–0.88)
Globulin: 2.3 g/dL (calc) (ref 1.9–3.7)
Glucose, Bld: 100 mg/dL — ABNORMAL HIGH (ref 65–99)
Potassium: 4.3 mmol/L (ref 3.5–5.3)
Sodium: 143 mmol/L (ref 135–146)
Total Bilirubin: 0.5 mg/dL (ref 0.2–1.2)
Total Protein: 6.5 g/dL (ref 6.1–8.1)

## 2019-05-01 LAB — LIPID PANEL
Cholesterol: 189 mg/dL (ref <200)
HDL: 72 mg/dL (ref >=50)
LDL Cholesterol (Calc): 96 mg/dL (calc)
Non-HDL Cholesterol (Calc): 117 mg/dL (calc) (ref <130)
Total CHOL/HDL Ratio: 2.6 (calc) (ref <5.0)
Triglycerides: 117 mg/dL (ref <150)

## 2019-05-01 LAB — CBC WITH DIFFERENTIAL/PLATELET
Absolute Monocytes: 469 cells/uL (ref 200–950)
Basophils Absolute: 0 cells/uL (ref 0–200)
Basophils Relative: 0 %
Eosinophils Absolute: 221 cells/uL (ref 15–500)
Eosinophils Relative: 3.3 %
HCT: 43.1 % (ref 35.0–45.0)
Hemoglobin: 13.9 g/dL (ref 11.7–15.5)
Lymphs Abs: 1173 cells/uL (ref 850–3900)
MCH: 30.5 pg (ref 27.0–33.0)
MCHC: 32.3 g/dL (ref 32.0–36.0)
MCV: 94.7 fL (ref 80.0–100.0)
MPV: 9.6 fL (ref 7.5–12.5)
Monocytes Relative: 7 %
Neutro Abs: 4837 cells/uL (ref 1500–7800)
Neutrophils Relative %: 72.2 %
Platelets: 356 10*3/uL (ref 140–400)
RBC: 4.55 10*6/uL (ref 3.80–5.10)
RDW: 12.6 % (ref 11.0–15.0)
Total Lymphocyte: 17.5 %
WBC: 6.7 10*3/uL (ref 3.8–10.8)

## 2019-05-01 LAB — T4, FREE: Free T4: 1.5 ng/dL (ref 0.8–1.8)

## 2019-05-01 LAB — TSH: TSH: 1.09 m[IU]/L (ref 0.40–4.50)

## 2019-05-03 ENCOUNTER — Ambulatory Visit (INDEPENDENT_AMBULATORY_CARE_PROVIDER_SITE_OTHER): Payer: Medicare Other

## 2019-05-03 ENCOUNTER — Other Ambulatory Visit: Payer: Self-pay

## 2019-05-03 DIAGNOSIS — L501 Idiopathic urticaria: Secondary | ICD-10-CM | POA: Diagnosis not present

## 2019-05-03 DIAGNOSIS — L509 Urticaria, unspecified: Secondary | ICD-10-CM

## 2019-05-30 ENCOUNTER — Other Ambulatory Visit: Payer: Self-pay | Admitting: Family Medicine

## 2019-05-31 ENCOUNTER — Ambulatory Visit: Payer: Self-pay

## 2019-06-07 ENCOUNTER — Other Ambulatory Visit: Payer: Self-pay

## 2019-06-07 ENCOUNTER — Ambulatory Visit (INDEPENDENT_AMBULATORY_CARE_PROVIDER_SITE_OTHER): Payer: Medicare Other

## 2019-06-07 DIAGNOSIS — L509 Urticaria, unspecified: Secondary | ICD-10-CM

## 2019-06-07 DIAGNOSIS — L501 Idiopathic urticaria: Secondary | ICD-10-CM | POA: Diagnosis not present

## 2019-06-24 ENCOUNTER — Other Ambulatory Visit: Payer: Self-pay | Admitting: Family Medicine

## 2019-06-24 DIAGNOSIS — L509 Urticaria, unspecified: Secondary | ICD-10-CM

## 2019-07-02 DIAGNOSIS — H524 Presbyopia: Secondary | ICD-10-CM | POA: Diagnosis not present

## 2019-07-02 DIAGNOSIS — H43813 Vitreous degeneration, bilateral: Secondary | ICD-10-CM | POA: Diagnosis not present

## 2019-07-04 DIAGNOSIS — G5603 Carpal tunnel syndrome, bilateral upper limbs: Secondary | ICD-10-CM | POA: Diagnosis not present

## 2019-07-05 ENCOUNTER — Other Ambulatory Visit: Payer: Self-pay

## 2019-07-05 ENCOUNTER — Ambulatory Visit: Payer: Self-pay

## 2019-07-05 ENCOUNTER — Ambulatory Visit (INDEPENDENT_AMBULATORY_CARE_PROVIDER_SITE_OTHER): Payer: Medicare Other

## 2019-07-05 DIAGNOSIS — L501 Idiopathic urticaria: Secondary | ICD-10-CM

## 2019-07-05 DIAGNOSIS — L509 Urticaria, unspecified: Secondary | ICD-10-CM

## 2019-07-10 DIAGNOSIS — C441122 Basal cell carcinoma of skin of right lower eyelid, including canthus: Secondary | ICD-10-CM | POA: Diagnosis not present

## 2019-07-18 ENCOUNTER — Other Ambulatory Visit: Payer: Self-pay

## 2019-07-19 ENCOUNTER — Telehealth: Payer: Self-pay | Admitting: *Deleted

## 2019-07-19 NOTE — Telephone Encounter (Signed)
L/M for patient to sign consent form for Xolair patient assistance when she comes in for injections on 5/21 to update her application  Will fax over today to [facility]

## 2019-07-22 ENCOUNTER — Ambulatory Visit: Payer: Medicare Other | Admitting: Internal Medicine

## 2019-07-23 ENCOUNTER — Ambulatory Visit: Payer: Medicare Other | Admitting: Internal Medicine

## 2019-08-02 ENCOUNTER — Ambulatory Visit: Payer: Self-pay

## 2019-08-02 DIAGNOSIS — C441122 Basal cell carcinoma of skin of right lower eyelid, including canthus: Secondary | ICD-10-CM | POA: Diagnosis not present

## 2019-08-02 DIAGNOSIS — D2210 Melanocytic nevi of unspecified eyelid, including canthus: Secondary | ICD-10-CM | POA: Diagnosis not present

## 2019-08-07 ENCOUNTER — Ambulatory Visit: Payer: Self-pay

## 2019-08-16 ENCOUNTER — Other Ambulatory Visit: Payer: Self-pay

## 2019-08-16 ENCOUNTER — Ambulatory Visit (INDEPENDENT_AMBULATORY_CARE_PROVIDER_SITE_OTHER): Payer: Medicare Other

## 2019-08-16 DIAGNOSIS — L501 Idiopathic urticaria: Secondary | ICD-10-CM | POA: Diagnosis not present

## 2019-08-16 DIAGNOSIS — Z09 Encounter for follow-up examination after completed treatment for conditions other than malignant neoplasm: Secondary | ICD-10-CM | POA: Diagnosis not present

## 2019-08-16 DIAGNOSIS — D2310 Other benign neoplasm of skin of unspecified eyelid, including canthus: Secondary | ICD-10-CM | POA: Diagnosis not present

## 2019-08-16 DIAGNOSIS — L509 Urticaria, unspecified: Secondary | ICD-10-CM

## 2019-08-16 NOTE — Telephone Encounter (Signed)
Got it and submitted

## 2019-08-16 NOTE — Telephone Encounter (Signed)
Form has been signed and faxed back to [facility].

## 2019-08-16 NOTE — Telephone Encounter (Signed)
Noted,  Thank you!

## 2019-08-18 ENCOUNTER — Other Ambulatory Visit: Payer: Self-pay | Admitting: Family Medicine

## 2019-08-18 DIAGNOSIS — E039 Hypothyroidism, unspecified: Secondary | ICD-10-CM

## 2019-08-28 ENCOUNTER — Ambulatory Visit: Payer: Medicare Other | Admitting: Family Medicine

## 2019-09-02 ENCOUNTER — Ambulatory Visit: Payer: Medicare Other | Admitting: Family Medicine

## 2019-09-02 ENCOUNTER — Encounter: Payer: Self-pay | Admitting: Family Medicine

## 2019-09-02 ENCOUNTER — Ambulatory Visit (INDEPENDENT_AMBULATORY_CARE_PROVIDER_SITE_OTHER): Payer: Medicare Other | Admitting: Family Medicine

## 2019-09-02 ENCOUNTER — Other Ambulatory Visit: Payer: Self-pay

## 2019-09-02 VITALS — BP 126/68 | HR 60 | Temp 97.9°F | Resp 14 | Ht 63.0 in | Wt 153.0 lb

## 2019-09-02 DIAGNOSIS — I2510 Atherosclerotic heart disease of native coronary artery without angina pectoris: Secondary | ICD-10-CM | POA: Diagnosis not present

## 2019-09-02 DIAGNOSIS — F329 Major depressive disorder, single episode, unspecified: Secondary | ICD-10-CM

## 2019-09-02 DIAGNOSIS — I10 Essential (primary) hypertension: Secondary | ICD-10-CM | POA: Diagnosis not present

## 2019-09-02 DIAGNOSIS — F32A Depression, unspecified: Secondary | ICD-10-CM

## 2019-09-02 DIAGNOSIS — E7800 Pure hypercholesterolemia, unspecified: Secondary | ICD-10-CM

## 2019-09-02 DIAGNOSIS — E039 Hypothyroidism, unspecified: Secondary | ICD-10-CM | POA: Diagnosis not present

## 2019-09-02 DIAGNOSIS — R252 Cramp and spasm: Secondary | ICD-10-CM

## 2019-09-02 NOTE — Assessment & Plan Note (Signed)
No current symptoms, lipids at goal

## 2019-09-02 NOTE — Patient Instructions (Signed)
F/U 4 months  

## 2019-09-02 NOTE — Progress Notes (Signed)
   Subjective:    Patient ID: Patricia Morton, female    DOB: [birth year], 86 y.o.   MRN: [id number]  Patient presents for Follow-up (is not fasting)  Patient here to follow-up chronic medical problems.  Medications reviewed.  Hypothyroidism no longer follow with endocrinology.  She went Back for like to have her thyroid levels checked again.  She feels like she gets the sweats a lot although this is not a new symptom.  Is been no change in her weight of her appetite.  Anxiety she has been maintained on Lexapro since well with this.  Protein malnutrition her weight has been stable and her appetite has been good.  Allergies she continues to follow with her allergies she is on multiple medications as well as immunotherapy with Xolair.  She would like to consider coming off of her immunotherapy she is going to discuss this with her allergist at her next visit.   Hypertension tension/coronary artery disease she is taking her blood pressure medicine as prescribed she is also on simvastatin.  Right hand improved with her carpal tunnel surgery but she still gets some mild symptoms in the left but does not want to intervene on this.  She does get intermittant legs cramps  Review Of Systems:  GEN- denies fatigue, fever, weight loss,weakness, recent illness HEENT- denies eye drainage, change in vision, nasal discharge, CVS- denies chest pain, palpitations RESP- denies SOB, cough, wheeze ABD- denies N/V, change in stools, abd pain GU- denies dysuria, hematuria, dribbling, incontinence MSK- denies joint pain, muscle aches, injury Neuro- denies headache, dizziness, syncope, seizure activity       Objective:    BP 126/68   Pulse 60   Temp 97.9 F (36.6 C) (Temporal)   Resp 14   Ht 5\' 3"  (1.6 m)   Wt 153 lb (69.4 kg)   SpO2 93%   BMI 27.10 kg/m  GEN- NAD, alert and oriented x3 HEENT- PERRL, EOMI, non injected sclera, pink conjunctiva, MMM, oropharynx clear Neck- Supple, no  thyromegaly CVS- RRR, no murmur RESP-CTAB ABD-NABS,soft,NT,ND EXT- No edema,varicose veins Psych- normal affect and mood Pulses- Radial, DP-palpated        Assessment & Plan:      Problem List Items Addressed This Visit      Unprioritized   Acquired hypothyroidism    Check TFT Continue replacement      Relevant Orders   TSH   T3, free   Benign essential hypertension - Primary    Controlled no changes       Relevant Orders   Comprehensive metabolic panel   CBC with Differential/Platelet   Coronary artery disease involving native heart    No current symptoms, lipids at goal      Relevant Orders   Comprehensive metabolic panel   CBC with Differential/Platelet   Depression, controlled    Continue current meds with lexapro Symptoms controlled       Pure hypercholesterolemia    Other Visit Diagnoses    Leg cramps       keep hydrated, check K level      Note: This dictation was prepared with Dragon dictation along with smaller phrase technology. Any transcriptional errors that result from this process are unintentional.

## 2019-09-02 NOTE — Assessment & Plan Note (Signed)
Check TFT Continue replacement

## 2019-09-02 NOTE — Assessment & Plan Note (Signed)
Controlled no changes 

## 2019-09-02 NOTE — Assessment & Plan Note (Signed)
Continue current meds with lexapro Symptoms controlled

## 2019-09-03 LAB — CBC WITH DIFFERENTIAL/PLATELET
Absolute Monocytes: 587 cells/uL (ref 200–950)
Basophils Absolute: 0 cells/uL (ref 0–200)
Basophils Relative: 0 %
Eosinophils Absolute: 248 cells/uL (ref 15–500)
Eosinophils Relative: 3.6 %
HCT: 42.1 % (ref 35.0–45.0)
Hemoglobin: 13.8 g/dL (ref 11.7–15.5)
Lymphs Abs: 2153 cells/uL (ref 850–3900)
MCH: 30.7 pg (ref 27.0–33.0)
MCHC: 32.8 g/dL (ref 32.0–36.0)
MCV: 93.6 fL (ref 80.0–100.0)
MPV: 9.6 fL (ref 7.5–12.5)
Monocytes Relative: 8.5 %
Neutro Abs: 3912 cells/uL (ref 1500–7800)
Neutrophils Relative %: 56.7 %
Platelets: 410 10*3/uL — ABNORMAL HIGH (ref 140–400)
RBC: 4.5 10*6/uL (ref 3.80–5.10)
RDW: 12.4 % (ref 11.0–15.0)
Total Lymphocyte: 31.2 %
WBC: 6.9 10*3/uL (ref 3.8–10.8)

## 2019-09-03 LAB — COMPREHENSIVE METABOLIC PANEL
AG Ratio: 1.5 (calc) (ref 1.0–2.5)
ALT: 13 U/L (ref 6–29)
AST: 20 U/L (ref 10–35)
Albumin: 4.1 g/dL (ref 3.6–5.1)
Alkaline phosphatase (APISO): 68 U/L (ref 37–153)
BUN/Creatinine Ratio: 11 (calc) (ref 6–22)
BUN: 10 mg/dL (ref 7–25)
CO2: 25 mmol/L (ref 20–32)
Calcium: 9.7 mg/dL (ref 8.6–10.4)
Chloride: 100 mmol/L (ref 98–110)
Creat: 0.89 mg/dL — ABNORMAL HIGH (ref 0.60–0.88)
Globulin: 2.7 g/dL (calc) (ref 1.9–3.7)
Glucose, Bld: 86 mg/dL (ref 65–99)
Potassium: 4.8 mmol/L (ref 3.5–5.3)
Sodium: 136 mmol/L (ref 135–146)
Total Bilirubin: 0.5 mg/dL (ref 0.2–1.2)
Total Protein: 6.8 g/dL (ref 6.1–8.1)

## 2019-09-03 LAB — TSH: TSH: 0.32 mIU/L — ABNORMAL LOW (ref 0.40–4.50)

## 2019-09-03 LAB — T3, FREE: T3, Free: 2.9 pg/mL (ref 2.3–4.2)

## 2019-09-06 ENCOUNTER — Other Ambulatory Visit: Payer: Self-pay | Admitting: *Deleted

## 2019-09-06 DIAGNOSIS — E039 Hypothyroidism, unspecified: Secondary | ICD-10-CM

## 2019-09-06 MED ORDER — LEVOTHYROXINE SODIUM 50 MCG PO TABS
50.0000 ug | ORAL_TABLET | Freq: Every day | ORAL | 0 refills | Status: DC
Start: 2019-09-06 — End: 2019-11-19

## 2019-09-09 ENCOUNTER — Ambulatory Visit: Payer: Medicare Other | Admitting: Allergy

## 2019-09-09 ENCOUNTER — Other Ambulatory Visit: Payer: Self-pay

## 2019-09-09 ENCOUNTER — Encounter: Payer: Self-pay | Admitting: Allergy

## 2019-09-09 VITALS — BP 120/64 | HR 56 | Temp 97.5°F | Resp 16 | Wt 150.8 lb

## 2019-09-09 DIAGNOSIS — L509 Urticaria, unspecified: Secondary | ICD-10-CM

## 2019-09-09 NOTE — Assessment & Plan Note (Signed)
Past history - 3 episodes of rash/hives flare up since November 2019. Initially it was thought to be due from her medication change to Celexa however had 2 flares since stopping Celexa. The prednisone and antihistamines do seem to help. She did have bloodwork which showed elevated TSH level and her synthroid dose was adjusted. 2020 environmental allergy panel was negative.  Interim history - No hives since the last visit. Only taking zyrtec 10mg  once a day for allergic symptoms. A few times received injection on the 5th week with no issues.   DECREASE Xolair injections to 150mg  every 4 weeks. Monitor symptoms.   Continuewith zyrtec 10mg  once a day for allergies.   If you have any issues with itching/rash then may restart zyrtec and Pepcid.

## 2019-09-09 NOTE — Progress Notes (Signed)
Follow Up Note  RE: Patricia Morton MRN: [id number] DOB: Jan 04, 1927 Date of Office Visit: 09/09/2019  Referring provider: Alycia Rossetti, MD Primary care provider: Alycia Rossetti, MD  Chief Complaint: Follow-up and Urticaria  History of Present Illness: I had the pleasure of seeing Patricia Morton for a follow up visit at the [facility] [place] on 09/09/2019. She is a 86 y.o. female, who is being followed for urticaria. Her previous allergy office visit was on 12/03/2018 with Dr. Maudie Mercury. Today is a regular follow up visit. She is accompanied today by her daughter who provided/contributed to the history. Up to date with COVID-19 vaccine: yes  Urticaria No outbreaks since the last visit. Currently on Xolair 300mg  every four weeks with no issues. A few times she received it 5 weeks out with no outbreaks.  Currently on zyrtec 10mg  daily only for allergies.  She was able to wean off Pepcid.   Assessment and Plan: Patricia Morton is a 86 y.o. female with: Urticaria Past history - 3 episodes of rash/hives flare up since November 2019. Initially it was thought to be due from her medication change to Celexa however had 2 flares since stopping Celexa. The prednisone and antihistamines do seem to help. She did have bloodwork which showed elevated TSH level and her synthroid dose was adjusted. 2020 environmental allergy panel was negative.  Interim history - No hives since the last visit. Only taking zyrtec 10mg  once a day for allergic symptoms. A few times received injection on the 5th week with no issues.   DECREASE Xolair injections to 150mg  every 4 weeks. Monitor symptoms.   Continuewith zyrtec 10mg  once a day for allergies.   If you have any issues with itching/rash then may restart zyrtec and Pepcid.  Return in about 4 months (around 01/09/2020).  Diagnostics: None.  Medication List:  Current Outpatient Medications  Medication Sig Dispense Refill  . calcium carbonate (CALCIUM 600)  1500 (600 Ca) MG TABS tablet Take by mouth 2 (two) times daily with a meal.    . cetirizine (ZYRTEC) 10 MG tablet Take 1 tablet (10 mg total) by mouth daily. 90 tablet 1  . EPINEPHrine 0.3 mg/0.3 mL IJ SOAJ injection Inject 0.3 mLs (0.3 mg total) into the muscle as needed for anaphylaxis. 1 Device 1  . escitalopram (LEXAPRO) 10 MG tablet TAKE 1 TABLET BY MOUTH EVERY DAY 90 tablet 1  . levothyroxine (SYNTHROID) 50 MCG tablet Take 1 tablet (50 mcg total) by mouth daily. 90 tablet 0  . metoprolol tartrate (LOPRESSOR) 50 MG tablet TAKE 1 TABLET BY MOUTH TWICE DAILY FOR BLOOD PRESSURE 180 tablet 1  . simvastatin (ZOCOR) 40 MG tablet TAKE 1 TABLET BY MOUTH AT BEDTIME 90 tablet 2  . triamterene-hydrochlorothiazide (MAXZIDE-25) 37.5-25 MG tablet TAKE 1/2 TABLET BY MOUTH EVERY MORNING 45 tablet 1   Current Facility-Administered Medications  Medication Dose Route Frequency Provider Last Rate Last Admin  . omalizumab Arvid Right) injection 300 mg  300 mg Subcutaneous Q28 days Garnet Sierras, DO   300 mg at 08/16/19 1501   Allergies: Allergies  Allergen Reactions  . Celexa [Citalopram Hydrobromide] Hives   I reviewed her past medical history, social history, family history, and environmental history and no significant changes have been reported from her previous visit.  Review of Systems  Constitutional: Negative for appetite change, chills, fever and unexpected weight change.  HENT: Negative for congestion and rhinorrhea.   Eyes: Negative for itching.  Respiratory: Negative for cough,  chest tightness, shortness of breath and wheezing.   Cardiovascular: Negative for chest pain.  Gastrointestinal: Negative for abdominal pain.  Genitourinary: Negative for difficulty urinating.  Skin: Negative for rash.  Neurological: Negative for headaches.   Objective: BP 120/64 (BP Location: Left Arm, Patient Position: Sitting, Cuff Size: Normal)   Pulse (!) 56   Temp (!) 97.5 F (36.4 C) (Temporal)   Resp 16   Wt  150 lb 12.8 oz (68.4 kg)   SpO2 94%   BMI 26.71 kg/m  Body mass index is 26.71 kg/m. Physical Exam Vitals and nursing note reviewed. Exam conducted with a chaperone present.  Constitutional:      Appearance: Normal appearance. She is well-developed.  HENT:     Head: Normocephalic and atraumatic.     Right Ear: Tympanic membrane and external ear normal.     Left Ear: Tympanic membrane and external ear normal.     Nose: Nose normal.     Mouth/Throat:     Mouth: Mucous membranes are moist.     Pharynx: Oropharynx is clear.  Eyes:     Conjunctiva/sclera: Conjunctivae normal.  Cardiovascular:     Rate and Rhythm: Normal rate and regular rhythm.     Heart sounds: Normal heart sounds. No murmur heard.  No friction rub. No gallop.   Pulmonary:     Effort: Pulmonary effort is normal.     Breath sounds: Normal breath sounds. No wheezing or rales.  Musculoskeletal:     Cervical back: Neck supple.  Skin:    General: Skin is warm.     Findings: No rash.  Neurological:     Mental Status: She is alert and oriented to person, place, and time.  Psychiatric:        Mood and Affect: Mood normal.        Behavior: Behavior normal.    Previous notes and tests were reviewed. The plan was reviewed with the patient/family, and all questions/concerned were addressed.  It was my pleasure to see Patricia Morton today and participate in her care. Please feel free to contact me with any questions or concerns.  Sincerely,  Rexene Alberts, DO Allergy & Immunology  [facility] office: [phone] [facility] office: [place] office: [phone]

## 2019-09-09 NOTE — Patient Instructions (Addendum)
Urticaria   DECREASE Xolair injections to 150mg  every 4 weeks. Monitor symptoms.   Continuewith zyrtec 10mg  once a day for allergies.    If you have any issues with itching/rash then may restart zyrtec and Pepcid.  Follow up in 4 months or sooner if needed.

## 2019-09-15 ENCOUNTER — Other Ambulatory Visit: Payer: Self-pay | Admitting: Family Medicine

## 2019-09-18 ENCOUNTER — Ambulatory Visit (INDEPENDENT_AMBULATORY_CARE_PROVIDER_SITE_OTHER): Payer: Medicare Other

## 2019-09-18 DIAGNOSIS — L501 Idiopathic urticaria: Secondary | ICD-10-CM

## 2019-10-01 DIAGNOSIS — D225 Melanocytic nevi of trunk: Secondary | ICD-10-CM | POA: Diagnosis not present

## 2019-10-01 DIAGNOSIS — Z1283 Encounter for screening for malignant neoplasm of skin: Secondary | ICD-10-CM | POA: Diagnosis not present

## 2019-10-01 DIAGNOSIS — D485 Neoplasm of uncertain behavior of skin: Secondary | ICD-10-CM | POA: Diagnosis not present

## 2019-10-01 DIAGNOSIS — L905 Scar conditions and fibrosis of skin: Secondary | ICD-10-CM | POA: Diagnosis not present

## 2019-10-16 ENCOUNTER — Ambulatory Visit: Payer: Self-pay

## 2019-10-23 ENCOUNTER — Ambulatory Visit (INDEPENDENT_AMBULATORY_CARE_PROVIDER_SITE_OTHER): Payer: Medicare Other

## 2019-10-23 ENCOUNTER — Other Ambulatory Visit: Payer: Self-pay

## 2019-10-23 DIAGNOSIS — L509 Urticaria, unspecified: Secondary | ICD-10-CM

## 2019-11-18 ENCOUNTER — Other Ambulatory Visit: Payer: Self-pay | Admitting: Family Medicine

## 2019-11-20 ENCOUNTER — Other Ambulatory Visit: Payer: Self-pay

## 2019-11-20 ENCOUNTER — Ambulatory Visit (INDEPENDENT_AMBULATORY_CARE_PROVIDER_SITE_OTHER): Payer: Medicare Other

## 2019-11-20 DIAGNOSIS — L501 Idiopathic urticaria: Secondary | ICD-10-CM | POA: Diagnosis not present

## 2019-12-16 ENCOUNTER — Other Ambulatory Visit: Payer: Self-pay | Admitting: Family Medicine

## 2019-12-18 ENCOUNTER — Other Ambulatory Visit: Payer: Self-pay

## 2019-12-18 ENCOUNTER — Ambulatory Visit (INDEPENDENT_AMBULATORY_CARE_PROVIDER_SITE_OTHER): Payer: Self-pay

## 2019-12-18 DIAGNOSIS — L501 Idiopathic urticaria: Secondary | ICD-10-CM

## 2019-12-23 ENCOUNTER — Other Ambulatory Visit: Payer: Self-pay

## 2019-12-23 ENCOUNTER — Ambulatory Visit: Payer: Medicare Other | Admitting: Allergy

## 2019-12-23 ENCOUNTER — Encounter: Payer: Self-pay | Admitting: Allergy

## 2019-12-23 VITALS — BP 106/62 | HR 57 | Temp 97.7°F | Resp 16

## 2019-12-23 DIAGNOSIS — L509 Urticaria, unspecified: Secondary | ICD-10-CM | POA: Diagnosis not present

## 2019-12-23 DIAGNOSIS — L501 Idiopathic urticaria: Secondary | ICD-10-CM

## 2019-12-23 NOTE — Progress Notes (Signed)
Follow Up Note  RE: Patricia Morton MRN: [id number] DOB: 1926/04/12 Date of Office Visit: 12/23/2019  Referring provider: Alycia Rossetti, MD Primary care provider: Alycia Rossetti, MD  Chief Complaint: Urticaria (doing well)  History of Present Illness: I had the pleasure of seeing Patricia Morton for a follow up visit at the [facility] [place] [facility] [place] on 12/23/2019. She is a 86 y.o. female, who is being followed for urticaria on Xolair 150 mg every 4 weeks. Her previous allergy office visit was on 09/09/2019 with Dr. Maudie Morton. Today is a regular follow up visit. She is accompanied today by her daughter who provided/contributed to the history.   Urticaria Decreased from Xolair 300mg  to 150mg  every 4 weeks with no flare in symptoms. Patient still taking zyrtec 10mg  daily for allergies as well with good benefit.  Assessment and Plan: Patricia Morton is a 86 y.o. female with: Urticaria Past history - 3 episodes of rash/hives flare up since November 2019. Initially it was thought to be due from her medication change to Celexa however had 2 flares since stopping Celexa. The prednisone and antihistamines do seem to help. She did have bloodwork which showed elevated TSH level and her synthroid dose was adjusted. 2020 environmental allergy panel was negative.  Interim history - No hives since the last visit with decrease in Xolair dose from 300mg  to 150mg . Only taking zyrtec 10mg  once a day for allergic symptoms.   CHANGE Xolair injections 150mg  to every 5 weeks x 3 doses. If no issues then will go to every 6 weeks x 3 doses then stop.   Change your next injection to 11/10 in [place].   Continuewith zyrtec 10mg  once a day for allergies.   If you have any issues with itching/rash then may restart zyrtec and Pepcid and let us know.   Return in about 4 months (around 04/24/2020).  Diagnostics: None.  Medication List:  Current Outpatient Medications  Medication Sig Dispense Refill  . calcium  carbonate (CALCIUM 600) 1500 (600 Ca) MG TABS tablet Take by mouth 2 (two) times daily with a meal.    . cetirizine (ZYRTEC) 10 MG tablet Take 1 tablet (10 mg total) by mouth daily. 90 tablet 1  . EPINEPHrine 0.3 mg/0.3 mL IJ SOAJ injection Inject 0.3 mLs (0.3 mg total) into the muscle as needed for anaphylaxis. 1 Device 1  . escitalopram (LEXAPRO) 10 MG tablet TAKE 1 TABLET BY MOUTH EVERY DAY 90 tablet 1  . levothyroxine (SYNTHROID) 50 MCG tablet TAKE 1 TABLET BY MOUTH DAILY 90 tablet 0  . metoprolol tartrate (LOPRESSOR) 50 MG tablet TAKE 1 TABLET BY MOUTH TWICE DAILY FOR BLOOD PRESSURE 180 tablet 1  . simvastatin (ZOCOR) 40 MG tablet TAKE 1 TABLET BY MOUTH AT BEDTIME 90 tablet 2  . triamterene-hydrochlorothiazide (MAXZIDE-25) 37.5-25 MG tablet TAKE 1/2 TABLET BY MOUTH EVERY MORNING 45 tablet 1   Current Facility-Administered Medications  Medication Dose Route Frequency Provider Last Rate Last Admin  . omalizumab Arvid Right) injection 300 mg  300 mg Subcutaneous Q28 days Garnet Sierras, DO   300 mg at 12/18/19 1101   Allergies: Allergies  Allergen Reactions  . Celexa [Citalopram Hydrobromide] Hives   I reviewed her past medical history, social history, family history, and environmental history and no significant changes have been reported from her previous visit.  Review of Systems  Constitutional: Negative for appetite change, chills, fever and unexpected weight change.  HENT: Negative for congestion and rhinorrhea.   Eyes: Negative  for itching.  Respiratory: Negative for cough, chest tightness, shortness of breath and wheezing.   Cardiovascular: Negative for chest pain.  Gastrointestinal: Negative for abdominal pain.  Genitourinary: Negative for difficulty urinating.  Skin: Negative for rash.  Neurological: Negative for headaches.   Objective: BP 106/62   Pulse (!) 57   Temp 97.7 F (36.5 C) (Temporal)   Resp 16   SpO2 94%  There is no height or weight on file to calculate  BMI. Physical Exam Vitals and nursing note reviewed. Exam conducted with a chaperone present.  Constitutional:      Appearance: Normal appearance. She is well-developed.  HENT:     Head: Normocephalic and atraumatic.     Right Ear: Tympanic membrane and external ear normal.     Left Ear: Tympanic membrane and external ear normal.     Nose: Nose normal.     Mouth/Throat:     Mouth: Mucous membranes are moist.     Pharynx: Oropharynx is clear.  Eyes:     Conjunctiva/sclera: Conjunctivae normal.  Cardiovascular:     Rate and Rhythm: Normal rate and regular rhythm.     Heart sounds: Normal heart sounds. No murmur heard.  No friction rub. No gallop.   Pulmonary:     Effort: Pulmonary effort is normal.     Breath sounds: Normal breath sounds. No wheezing or rales.  Musculoskeletal:     Cervical back: Neck supple.  Skin:    General: Skin is warm.     Findings: No rash.  Neurological:     Mental Status: She is alert and oriented to person, place, and time.  Psychiatric:        Mood and Affect: Mood normal.        Behavior: Behavior normal.    Previous notes and tests were reviewed. The plan was reviewed with the patient/family, and all questions/concerned were addressed.  It was my pleasure to see Patricia Morton today and participate in her care. Please feel free to contact me with any questions or concerns.  Sincerely,  Rexene Alberts, DO Allergy & Immunology  [facility] office: [place] office: [phone]

## 2019-12-23 NOTE — Patient Instructions (Addendum)
Urticaria   CHANGE Xolair injections 150mg  to every 5 weeks. Monitor symptoms.   Change your next injection to 11/10 in [place].   Continuewith zyrtec 10mg  once a day for allergies.    If you have any issues with itching/rash then may restart zyrtec and Pepcid and let us know.   Follow up in 4 months - after your third Xolair injections.   We may do this via televisit if you would like.

## 2019-12-23 NOTE — Assessment & Plan Note (Addendum)
Past history - 3 episodes of rash/hives flare up since November 2019. Initially it was thought to be due from her medication change to Celexa however had 2 flares since stopping Celexa. The prednisone and antihistamines do seem to help. She did have bloodwork which showed elevated TSH level and her synthroid dose was adjusted. 2020 environmental allergy panel was negative.  Interim history - No hives since the last visit with decrease in Xolair dose from 300mg  to 150mg . Only taking zyrtec 10mg  once a day for allergic symptoms.   CHANGE Xolair injections 150mg  to every 5 weeks x 3 doses. If no issues then will go to every 6 weeks x 3 doses then stop.   Change your next injection to 11/10 in [place].   Continuewith zyrtec 10mg  once a day for allergies.   If you have any issues with itching/rash then may restart zyrtec and Pepcid and let us know.

## 2020-01-03 ENCOUNTER — Ambulatory Visit: Payer: Medicare Other | Admitting: Family Medicine

## 2020-01-15 ENCOUNTER — Ambulatory Visit: Payer: Self-pay

## 2020-01-17 ENCOUNTER — Encounter: Payer: Self-pay | Admitting: Family Medicine

## 2020-01-17 ENCOUNTER — Other Ambulatory Visit: Payer: Self-pay

## 2020-01-17 ENCOUNTER — Ambulatory Visit (INDEPENDENT_AMBULATORY_CARE_PROVIDER_SITE_OTHER): Payer: Medicare Other | Admitting: Family Medicine

## 2020-01-17 VITALS — BP 128/70 | HR 92 | Temp 97.7°F | Resp 16 | Ht 63.0 in | Wt 155.0 lb

## 2020-01-17 DIAGNOSIS — I8390 Asymptomatic varicose veins of unspecified lower extremity: Secondary | ICD-10-CM | POA: Insufficient documentation

## 2020-01-17 DIAGNOSIS — I2510 Atherosclerotic heart disease of native coronary artery without angina pectoris: Secondary | ICD-10-CM

## 2020-01-17 DIAGNOSIS — I10 Essential (primary) hypertension: Secondary | ICD-10-CM | POA: Diagnosis not present

## 2020-01-17 DIAGNOSIS — F32A Depression, unspecified: Secondary | ICD-10-CM

## 2020-01-17 DIAGNOSIS — I8393 Asymptomatic varicose veins of bilateral lower extremities: Secondary | ICD-10-CM | POA: Diagnosis not present

## 2020-01-17 DIAGNOSIS — E039 Hypothyroidism, unspecified: Secondary | ICD-10-CM

## 2020-01-17 NOTE — Assessment & Plan Note (Signed)
Recheck TFT Weight has come back up Continue levothyroxine

## 2020-01-17 NOTE — Progress Notes (Signed)
   Subjective:    Patient ID: Patricia Morton, female    DOB: [birth year], 86 y.o.   MRN: [id number]  Patient presents for Follow-up (is not fasting)  Pt here to f/u chronic medical problems. Medications reviewed     Due for repeat TSH on thyroid, dose reduced to 72mcg at last visit in June her weight is actually up 5 pounds now that she is on the reduced dose.  States her appetite is good.  No problems with her bowels.  Patient is reviewed she still taking her blood pressure medicines as prescribed has not had any side effects with the medication she feels well.  Today 2 weeks ago had some spontaneous bleeding from her lower leg she thought was related to the Covid vaccine.  She has varicose veins and states that there was a vein that was popped up and that is the one that was bleeding it is now scabbed over has not had any redness and pain  Review Of Systems:  GEN- denies fatigue, fever, weight loss,weakness, recent illness HEENT- denies eye drainage, change in vision, nasal discharge, CVS- denies chest pain, palpitations RESP- denies SOB, cough, wheeze ABD- denies N/V, change in stools, abd pain GU- denies dysuria, hematuria, dribbling, incontinence MSK- denies joint pain, muscle aches, injury Neuro- denies headache, dizziness, syncope, seizure activity       Objective:    BP 128/70   Pulse 92   Temp 97.7 F (36.5 C) (Temporal)   Resp 16   Ht 5\' 3"  (1.6 m)   Wt 155 lb (70.3 kg)   SpO2 99%   BMI 27.46 kg/m  GEN- NAD, alert and oriented x3 HEENT- PERRL, EOMI, non injected sclera, pink conjunctiva  Neck- Supple, no thyromegaly CVS- RRR, no murmur RESP-CTAB ABD-NABS,soft,NT,ND EXT- No edema,varicose veins, scab on left lower leg in 3 spots, no erythema, NT  Pulses- Radial, DP-palpated        Assessment & Plan:      Problem List Items Addressed This Visit      Unprioritized   Acquired hypothyroidism    Recheck TFT Weight has come back up Continue levothyroxine       Relevant Orders   TSH   T3, free   T4, free   Benign essential hypertension - Primary    Controlled no changes      Relevant Orders   CBC with Differential/Platelet   Basic metabolic panel   Coronary artery disease involving native heart    No current symptoms       Relevant Orders   CBC with Differential/Platelet   Basic metabolic panel   Depression, controlled    Doing well on lexapro      Varicose vein of leg    I suspect she had spontaneous rupture of a vein, now resolved          Note: This dictation was prepared with Dragon dictation along with smaller phrase technology. Any transcriptional errors that result from this process are unintentional.

## 2020-01-17 NOTE — Assessment & Plan Note (Signed)
No current symptoms

## 2020-01-17 NOTE — Assessment & Plan Note (Signed)
I suspect she had spontaneous rupture of a vein, now resolved

## 2020-01-17 NOTE — Patient Instructions (Signed)
F/U 6 months

## 2020-01-17 NOTE — Assessment & Plan Note (Signed)
Doing well on lexapro 

## 2020-01-17 NOTE — Assessment & Plan Note (Signed)
Controlled no changes 

## 2020-01-18 LAB — CBC WITH DIFFERENTIAL/PLATELET
Absolute Monocytes: 561 cells/uL (ref 200–950)
Basophils Absolute: 0 cells/uL (ref 0–200)
Basophils Relative: 0 %
Eosinophils Absolute: 317 cells/uL (ref 15–500)
Eosinophils Relative: 4.8 %
HCT: 43.8 % (ref 35.0–45.0)
Hemoglobin: 14.3 g/dL (ref 11.7–15.5)
Lymphs Abs: 2310 cells/uL (ref 850–3900)
MCH: 30.8 pg (ref 27.0–33.0)
MCHC: 32.6 g/dL (ref 32.0–36.0)
MCV: 94.4 fL (ref 80.0–100.0)
MPV: 9.7 fL (ref 7.5–12.5)
Monocytes Relative: 8.5 %
Neutro Abs: 3412 cells/uL (ref 1500–7800)
Neutrophils Relative %: 51.7 %
Platelets: 407 10*3/uL — ABNORMAL HIGH (ref 140–400)
RBC: 4.64 10*6/uL (ref 3.80–5.10)
RDW: 12.9 % (ref 11.0–15.0)
Total Lymphocyte: 35 %
WBC: 6.6 10*3/uL (ref 3.8–10.8)

## 2020-01-18 LAB — BASIC METABOLIC PANEL
BUN: 8 mg/dL (ref 7–25)
CO2: 26 mmol/L (ref 20–32)
Calcium: 9.5 mg/dL (ref 8.6–10.4)
Chloride: 99 mmol/L (ref 98–110)
Creat: 0.81 mg/dL (ref 0.60–0.88)
Glucose, Bld: 83 mg/dL (ref 65–99)
Potassium: 4.2 mmol/L (ref 3.5–5.3)
Sodium: 136 mmol/L (ref 135–146)

## 2020-01-18 LAB — T4, FREE: Free T4: 1.1 ng/dL (ref 0.8–1.8)

## 2020-01-18 LAB — T3, FREE: T3, Free: 2.1 pg/mL — ABNORMAL LOW (ref 2.3–4.2)

## 2020-01-18 LAB — TSH: TSH: 22.3 mIU/L — ABNORMAL HIGH (ref 0.40–4.50)

## 2020-01-22 ENCOUNTER — Ambulatory Visit (INDEPENDENT_AMBULATORY_CARE_PROVIDER_SITE_OTHER): Payer: Medicare Other

## 2020-01-22 ENCOUNTER — Other Ambulatory Visit: Payer: Self-pay | Admitting: *Deleted

## 2020-01-22 ENCOUNTER — Other Ambulatory Visit: Payer: Self-pay

## 2020-01-22 DIAGNOSIS — L501 Idiopathic urticaria: Secondary | ICD-10-CM | POA: Diagnosis not present

## 2020-01-22 MED ORDER — OMALIZUMAB 150 MG ~~LOC~~ SOLR
150.0000 mg | SUBCUTANEOUS | Status: AC
Start: 2020-01-22 — End: ?
  Administered 2020-01-22 – 2020-09-09 (×7): 150 mg via SUBCUTANEOUS

## 2020-01-22 MED ORDER — LEVOTHYROXINE SODIUM 25 MCG PO TABS: 12.5000 ug | ORAL_TABLET | Freq: Every day | ORAL | 1 refills | Status: DC

## 2020-02-19 ENCOUNTER — Ambulatory Visit: Payer: Self-pay

## 2020-02-26 ENCOUNTER — Ambulatory Visit: Payer: Self-pay

## 2020-03-04 ENCOUNTER — Other Ambulatory Visit: Payer: Self-pay

## 2020-03-04 ENCOUNTER — Ambulatory Visit (INDEPENDENT_AMBULATORY_CARE_PROVIDER_SITE_OTHER): Payer: Medicare Other

## 2020-03-04 DIAGNOSIS — L501 Idiopathic urticaria: Secondary | ICD-10-CM

## 2020-03-15 ENCOUNTER — Other Ambulatory Visit: Payer: Self-pay | Admitting: Family Medicine

## 2020-03-16 ENCOUNTER — Other Ambulatory Visit: Payer: Self-pay | Admitting: Family Medicine

## 2020-04-01 ENCOUNTER — Ambulatory Visit: Payer: Self-pay

## 2020-04-04 ENCOUNTER — Ambulatory Visit (HOSPITAL_COMMUNITY)
Admission: RE | Admit: 2020-04-04 | Discharge: 2020-04-04 | Disposition: A | Discharge status: Home / Self Care | Payer: Medicare Other | Source: Ambulatory Visit | Attending: Pulmonary Disease | Admitting: Pulmonary Disease

## 2020-04-04 ENCOUNTER — Other Ambulatory Visit: Payer: Self-pay | Admitting: Physician Assistant

## 2020-04-04 DIAGNOSIS — I10 Essential (primary) hypertension: Secondary | ICD-10-CM

## 2020-04-04 DIAGNOSIS — I2510 Atherosclerotic heart disease of native coronary artery without angina pectoris: Secondary | ICD-10-CM

## 2020-04-04 DIAGNOSIS — U071 COVID-19: Secondary | ICD-10-CM | POA: Insufficient documentation

## 2020-04-04 MED ORDER — FAMOTIDINE IN NACL 20-0.9 MG/50ML-% IV SOLN: 20.0000 mg | Freq: Once | INTRAVENOUS | Status: DC | PRN

## 2020-04-04 MED ORDER — SOTROVIMAB 500 MG/8ML IV SOLN
500.0000 mg | Freq: Once | INTRAVENOUS | Status: AC
  Administered 2020-04-04: 500 mg via INTRAVENOUS

## 2020-04-04 MED ORDER — METHYLPREDNISOLONE SODIUM SUCC 125 MG IJ SOLR: 125.0000 mg | Freq: Once | INTRAMUSCULAR | Status: DC | PRN

## 2020-04-04 MED ORDER — ALBUTEROL SULFATE HFA 108 (90 BASE) MCG/ACT IN AERS: 2.0000 | INHALATION_SPRAY | Freq: Once | RESPIRATORY_TRACT | Status: DC | PRN

## 2020-04-04 MED ORDER — SODIUM CHLORIDE 0.9 % IV SOLN: INTRAVENOUS | Status: DC | PRN

## 2020-04-04 MED ORDER — DIPHENHYDRAMINE HCL 50 MG/ML IJ SOLN: 50.0000 mg | Freq: Once | INTRAMUSCULAR | Status: DC | PRN

## 2020-04-04 MED ORDER — EPINEPHRINE 0.3 MG/0.3ML IJ SOAJ: 0.3000 mg | Freq: Once | INTRAMUSCULAR | Status: DC | PRN

## 2020-04-04 NOTE — Progress Notes (Signed)
I connected by phone with [name] on 04/04/2020 at 11:36 AM to discuss the potential use of a new treatment for mild to moderate COVID-19 viral infection in non-hospitalized patients.  This patient is a 87 y.o. female that meets the FDA criteria for Emergency Use Authorization of COVID monoclonal antibody sotrovimab, casirivimab/imdevimab or bamlamivimab/estevimab.  Has a (+) direct SARS-CoV-2 viral test result  Has mild or moderate COVID-19   Is NOT hospitalized due to COVID-19  Is within 10 days of symptom onset  Has at least one of the high risk factor(s) for progression to severe COVID-19 and/or hospitalization as defined in EUA.  Specific high risk criteria : Older age (>/= 85 yo), BMI > 25 and Other high risk medical condition per CDC:  vaccinated but not boosted, HTN   I have spoken and communicated the following to the patient or parent/caregiver regarding COVID monoclonal antibody treatment:  1. FDA has authorized the emergency use for the treatment of mild to moderate COVID-19 in adults and pediatric patients with positive results of direct SARS-CoV-2 viral testing who are 26 years of age and older weighing at least 40 kg, and who are at high risk for progressing to severe COVID-19 and/or hospitalization.  2. The significant known and potential risks and benefits of COVID monoclonal antibody, and the extent to which such potential risks and benefits are unknown.  3. Information on available alternative treatments and the risks and benefits of those alternatives, including clinical trials.  4. Patients treated with COVID monoclonal antibody should continue to self-isolate and use infection control measures (e.g., wear mask, isolate, social distance, avoid sharing personal items, clean and disinfect "high touch" surfaces, and frequent handwashing) according to CDC guidelines.   5. The patient or parent/caregiver has the option to accept or refuse COVID monoclonal antibody  treatment.  After reviewing this information with the patient, the patient has agreed to receive one of the available covid 19 monoclonal antibodies and will be provided an appropriate fact sheet prior to infusion.  Sx onset 1/21. Set up for infusion on 1/22 @ 12:30pm. Directions given to [facility]. Pt is aware that insurance will be charged an infusion fee. Pt is vaccinated but not boosted.  Angelena Form 04/04/2020 11:36 AM

## 2020-04-04 NOTE — Progress Notes (Signed)
Patient reviewed Fact Sheet for Patients, Parents, and Caregivers for Emergency Use Authorization (EUA) of sotrovimab for the Treatment of Coronavirus. Patient also reviewed and is agreeable to the estimated cost of treatment. Patient is agreeable to proceed.   

## 2020-04-04 NOTE — Progress Notes (Signed)
Diagnosis: COVID-19  Physician: Dr. Patrick Wright  Procedure: [facility] Infusion [facility] Med: Sotrovimab infusion - Provided patient with sotrovimab fact sheet for patients, parents, and caregivers prior to infusion.   Complications: No immediate complications noted  Discharge: Discharged home    

## 2020-04-04 NOTE — Discharge Instructions (Signed)

## 2020-04-15 ENCOUNTER — Other Ambulatory Visit: Payer: Self-pay

## 2020-04-15 ENCOUNTER — Ambulatory Visit (INDEPENDENT_AMBULATORY_CARE_PROVIDER_SITE_OTHER): Payer: Medicare Other

## 2020-04-15 DIAGNOSIS — L501 Idiopathic urticaria: Secondary | ICD-10-CM

## 2020-05-13 ENCOUNTER — Other Ambulatory Visit: Payer: Self-pay

## 2020-05-13 ENCOUNTER — Ambulatory Visit (INDEPENDENT_AMBULATORY_CARE_PROVIDER_SITE_OTHER): Payer: Medicare Other

## 2020-05-13 DIAGNOSIS — L501 Idiopathic urticaria: Secondary | ICD-10-CM

## 2020-06-02 DIAGNOSIS — H905 Unspecified sensorineural hearing loss: Secondary | ICD-10-CM | POA: Diagnosis not present

## 2020-06-08 ENCOUNTER — Other Ambulatory Visit: Payer: Self-pay | Admitting: Family Medicine

## 2020-06-12 ENCOUNTER — Other Ambulatory Visit: Payer: Self-pay

## 2020-06-12 ENCOUNTER — Ambulatory Visit (INDEPENDENT_AMBULATORY_CARE_PROVIDER_SITE_OTHER): Payer: Medicare Other

## 2020-06-12 DIAGNOSIS — L501 Idiopathic urticaria: Secondary | ICD-10-CM

## 2020-06-13 ENCOUNTER — Other Ambulatory Visit: Payer: Self-pay | Admitting: Family Medicine

## 2020-07-10 ENCOUNTER — Ambulatory Visit: Payer: Self-pay

## 2020-07-13 ENCOUNTER — Ambulatory Visit: Payer: Medicare Other | Admitting: Allergy

## 2020-07-13 ENCOUNTER — Other Ambulatory Visit: Payer: Self-pay

## 2020-07-13 ENCOUNTER — Encounter: Payer: Self-pay | Admitting: Allergy

## 2020-07-13 VITALS — BP 116/70 | HR 59 | Temp 97.8°F | Resp 14 | Ht 64.0 in | Wt 154.8 lb

## 2020-07-13 DIAGNOSIS — L509 Urticaria, unspecified: Secondary | ICD-10-CM

## 2020-07-13 NOTE — Patient Instructions (Addendum)
Urticaria   Get next Xolair injection on 5/6 and then get one in 6 weeks (on 6/17)  If no issues then we will stop the injections.    Continuewith zyrtec 10mg  once a day for allergies.    If you have any issues with itching/rash then let us know.  Follow up as needed.

## 2020-07-13 NOTE — Assessment & Plan Note (Signed)
Past history - 3 episodes of rash/hives flare up since November 2019. Initially it was thought to be due from her medication change to Celexa however had 2 flares since stopping Celexa. The prednisone and antihistamines do seem to help. She did have bloodwork which showed elevated TSH level and her synthroid dose was adjusted. 2020 environmental allergy panel was negative.  Interim history - No hives for many months. Only taking zyrtec 10mg  once a day for allergic symptoms.   Get next Xolair 150mg  injection on 5/6 (which will be 5 weeks from last dose) and then get one in 6 weeks (on 6/17) then stop.   Continuewith zyrtec 10mg  once a day for allergies.   If you have any issues with itching/rash then let us know.

## 2020-07-13 NOTE — Progress Notes (Signed)
Follow Up Note  RE: Patricia Morton MRN: [id number] DOB: 1926/05/09 Date of Office Visit: 07/13/2020  Referring provider: No ref. provider found Primary care provider: Susy Frizzle, MD  Chief Complaint: Urticaria (Says she has not had any issues. Wants to get off injections since she is doing well thus far.)  History of Present Illness: I had the pleasure of seeing Patricia Morton for a follow up visit at the [facility] [place] on 07/13/2020. She is a 87 y.o. female, who is being followed for urticaria on Xolair. Her previous allergy office visit was on 12/23/2019 with Dr. Maudie Mercury. Today is a regular follow up visit. She is accompanied today by her daughter who provided/contributed to the history.   Urticaria No hives or itching for many months.  Still taking zyrtec 10mg  once a day which help her with the rhinitis symptoms as well.   Wants to stop Xolair.   Assessment and Plan: Patricia Morton is a 87 y.o. female with: Urticaria Past history - 3 episodes of rash/hives flare up since November 2019. Initially it was thought to be due from her medication change to Celexa however had 2 flares since stopping Celexa. The prednisone and antihistamines do seem to help. She did have bloodwork which showed elevated TSH level and her synthroid dose was adjusted. 2020 environmental allergy panel was negative.  Interim history - No hives for many months. Only taking zyrtec 10mg  once a day for allergic symptoms.   Get next Xolair 150mg  injection on 5/6 (which will be 5 weeks from last dose) and then get one in 6 weeks (on 6/17) then stop.   Continuewith zyrtec 10mg  once a day for allergies.   If you have any issues with itching/rash then let us know.  Return if symptoms worsen or fail to improve.  No orders of the defined types were placed in this encounter.  Lab Orders  No laboratory test(s) ordered today    Diagnostics: None.  Medication List:  Current Outpatient Medications  Medication  Sig Dispense Refill  . calcium carbonate (OSCAL) 1500 (600 Ca) MG TABS tablet Take by mouth 2 (two) times daily with a meal.    . cetirizine (ZYRTEC) 10 MG tablet TAKE 1 TABLET BY MOUTH EVERY DAY 90 tablet 2  . EPINEPHrine 0.3 mg/0.3 mL IJ SOAJ injection Inject 0.3 mLs (0.3 mg total) into the muscle as needed for anaphylaxis. 1 Device 1  . escitalopram (LEXAPRO) 10 MG tablet TAKE 1 TABLET BY MOUTH EVERY DAY 90 tablet 0  . levothyroxine (SYNTHROID) 25 MCG tablet TAKE 1/2 TABLET BY MOUTH EVERY DAY BEFORE BREAKFAST. TAKE IN ADDITION TO 50MCG 15 tablet 1  . levothyroxine (SYNTHROID) 50 MCG tablet TAKE 1 TABLET BY MOUTH DAILY 90 tablet 1  . metoprolol tartrate (LOPRESSOR) 50 MG tablet TAKE 1 TABLET BY MOUTH TWICE DAILY FOR BLOOD PRESSURE 180 tablet 1  . simvastatin (ZOCOR) 40 MG tablet TAKE 1 TABLET BY MOUTH AT BEDTIME 90 tablet 2  . triamterene-hydrochlorothiazide (MAXZIDE-25) 37.5-25 MG tablet TAKE 1/2 TABLET BY MOUTH EVERY MORNING (Patient not taking: Reported on 07/13/2020) 45 tablet 1   Current Facility-Administered Medications  Medication Dose Route Frequency Provider Last Rate Last Admin  . omalizumab Arvid Right) injection 150 mg  150 mg Subcutaneous Q28 days Garnet Sierras, DO   150 mg at 06/12/20 1030  . omalizumab Arvid Right) injection 300 mg  300 mg Subcutaneous Q28 days Garnet Sierras, DO   300 mg at 12/18/19 1101  Allergies: Allergies  Allergen Reactions  . Celexa [Citalopram Hydrobromide] Hives   I reviewed her past medical history, social history, family history, and environmental history and no significant changes have been reported from her previous visit.  Review of Systems  Constitutional: Negative for appetite change, chills, fever and unexpected weight change.  HENT: Negative for congestion and rhinorrhea.   Eyes: Negative for itching.  Respiratory: Negative for cough, chest tightness, shortness of breath and wheezing.   Cardiovascular: Negative for chest pain.  Gastrointestinal:  Negative for abdominal pain.  Genitourinary: Negative for difficulty urinating.  Skin: Negative for rash.  Neurological: Negative for headaches.   Objective: BP 116/70   Pulse (!) 59   Temp 97.8 F (36.6 C)   Resp 14   Ht 5\' 4"  (1.626 m)   Wt 154 lb 12.8 oz (70.2 kg)   SpO2 97%   BMI 26.57 kg/m  Body mass index is 26.57 kg/m. Physical Exam Vitals and nursing note reviewed. Exam conducted with a chaperone present.  Constitutional:      Appearance: Normal appearance. She is well-developed.  HENT:     Head: Normocephalic and atraumatic.     Right Ear: Tympanic membrane and external ear normal.     Left Ear: Tympanic membrane and external ear normal.     Nose: Nose normal.     Mouth/Throat:     Mouth: Mucous membranes are moist.     Pharynx: Oropharynx is clear.  Eyes:     Conjunctiva/sclera: Conjunctivae normal.  Cardiovascular:     Rate and Rhythm: Normal rate and regular rhythm.     Heart sounds: Normal heart sounds. No murmur heard. No friction rub. No gallop.   Pulmonary:     Effort: Pulmonary effort is normal.     Breath sounds: Normal breath sounds. No wheezing or rales.  Musculoskeletal:     Cervical back: Neck supple.  Skin:    General: Skin is warm.     Findings: No rash.  Neurological:     Mental Status: She is alert and oriented to person, place, and time.  Psychiatric:        Mood and Affect: Mood normal.        Behavior: Behavior normal.    Previous notes and tests were reviewed. The plan was reviewed with the patient/family, and all questions/concerned were addressed.  It was my pleasure to see Patricia Morton today and participate in her care. Please feel free to contact me with any questions or concerns.  Sincerely,  Rexene Alberts, DO Allergy & Immunology  [facility] office: [place] office: [phone]

## 2020-07-23 ENCOUNTER — Encounter: Payer: Self-pay | Admitting: Family Medicine

## 2020-07-23 ENCOUNTER — Other Ambulatory Visit: Payer: Self-pay

## 2020-07-23 ENCOUNTER — Ambulatory Visit (INDEPENDENT_AMBULATORY_CARE_PROVIDER_SITE_OTHER): Payer: Medicare Other | Admitting: Family Medicine

## 2020-07-23 VITALS — BP 120/64 | HR 80 | Temp 98.0°F | Resp 14 | Ht 64.0 in | Wt 154.0 lb

## 2020-07-23 DIAGNOSIS — E7800 Pure hypercholesterolemia, unspecified: Secondary | ICD-10-CM | POA: Diagnosis not present

## 2020-07-23 DIAGNOSIS — I2510 Atherosclerotic heart disease of native coronary artery without angina pectoris: Secondary | ICD-10-CM | POA: Diagnosis not present

## 2020-07-23 DIAGNOSIS — I10 Essential (primary) hypertension: Secondary | ICD-10-CM

## 2020-07-23 DIAGNOSIS — E039 Hypothyroidism, unspecified: Secondary | ICD-10-CM | POA: Diagnosis not present

## 2020-07-23 DIAGNOSIS — Z780 Asymptomatic menopausal state: Secondary | ICD-10-CM

## 2020-07-23 NOTE — Progress Notes (Signed)
Subjective:    Patient ID: ARYONNA GUNNERSON, female    DOB: Apr 25, 1926, 87 y.o.   MRN: [id number]  HPI Patient is a very pleasant 87 year old Caucasian female here today to establish care with me.  She is accompanied by her daughter however I am very impressed with how sharp she is.  She does not appear her stated age.  She is very healthy and vibrant.  She has a history of hypothyroidism for which she takes Synthroid 62.5 mg daily.  She is due to check a TSH.  She is also on simvastatin for hyperlipidemia.  She denies any myalgias or right upper quadrant pain.  She is on a combination of Maxide and Lopressor for hypertension.  Her blood pressure today is well controlled.  Due to age she does not request any cancer screening.  However she has not had a bone density in quite some time.  I reviewed her immunization records and she is due for a booster on her COVID shot Past Medical History:  Diagnosis Date  . Anxiety   . Arthritis   . Cataract    surgically removed  . Hyperlipemia   . Hypertension   . Hypothyroid   . Urticaria    Past Surgical History:  Procedure Laterality Date  . ABDOMINAL HYSTERECTOMY    . BREAST SURGERY     right breast -lumpectomy-benign  . CYSTECTOMY    . EYE SURGERY    . HAND SURGERY Right 02/2019  . TOTAL KNEE ARTHROPLASTY Right 05/25/2015   Procedure: TOTAL KNEE ARTHROPLASTY;  Surgeon: Gaynelle Arabian, MD;  Location: [facility];  Service: Orthopedics;  Laterality: Right;  . TOTAL KNEE ARTHROPLASTY Left 12/14/2015   Procedure: LEFT TOTAL KNEE ARTHROPLASTY;  Surgeon: Gaynelle Arabian, MD;  Location: [facility];  Service: Orthopedics;  Laterality: Left;   Current Outpatient Medications on File Prior to Visit  Medication Sig Dispense Refill  . calcium carbonate (OSCAL) 1500 (600 Ca) MG TABS tablet Take by mouth 2 (two) times daily with a meal.    . cetirizine (ZYRTEC) 10 MG tablet TAKE 1 TABLET BY MOUTH EVERY DAY 90 tablet 2  . EPINEPHrine 0.3 mg/0.3 mL IJ SOAJ injection Inject  0.3 mLs (0.3 mg total) into the muscle as needed for anaphylaxis. 1 Device 1  . escitalopram (LEXAPRO) 10 MG tablet TAKE 1 TABLET BY MOUTH EVERY DAY 90 tablet 0  . levothyroxine (SYNTHROID) 25 MCG tablet TAKE 1/2 TABLET BY MOUTH EVERY DAY BEFORE BREAKFAST. TAKE IN ADDITION TO 50MCG 15 tablet 1  . levothyroxine (SYNTHROID) 50 MCG tablet TAKE 1 TABLET BY MOUTH DAILY 90 tablet 1  . metoprolol tartrate (LOPRESSOR) 50 MG tablet TAKE 1 TABLET BY MOUTH TWICE DAILY FOR BLOOD PRESSURE 180 tablet 1  . simvastatin (ZOCOR) 40 MG tablet TAKE 1 TABLET BY MOUTH AT BEDTIME 90 tablet 2  . triamterene-hydrochlorothiazide (MAXZIDE-25) 37.5-25 MG tablet TAKE 1/2 TABLET BY MOUTH EVERY MORNING 45 tablet 1   Current Facility-Administered Medications on File Prior to Visit  Medication Dose Route Frequency Provider Last Rate Last Admin  . omalizumab Arvid Right) injection 150 mg  150 mg Subcutaneous Q28 days Garnet Sierras, DO   150 mg at 06/12/20 1030   Allergies  Allergen Reactions  . Celexa [Citalopram Hydrobromide] Hives   Social History   Socioeconomic History  . Marital status: Widowed    Spouse name: Not on file  . Number of children: Not on file  . Years of education: Not on file  . Highest  education level: Not on file  Occupational History  . Not on file  Tobacco Use  . Smoking status: Former Smoker    Years: 15.00    Types: Cigarettes    Start date: 1980    Quit date: 2004    Years since quitting: 18.3  . Smokeless tobacco: Never Used  Vaping Use  . Vaping Use: Never used  Substance and Sexual Activity  . Alcohol use: Not Currently    Comment: wine occassionally  . Drug use: No  . Sexual activity: Not Currently  Other Topics Concern  . Not on file  Social History Narrative  . Not on file   Social Determinants of Health   Financial Resource Strain: Not on file  Food Insecurity: Not on file  Transportation Needs: Not on file  Physical Activity: Not on file  Stress: Not on file  Social  Connections: Not on file  Intimate Partner Violence: Not on file     Review of Systems  All other systems reviewed and are negative.      Objective:   Physical Exam Vitals reviewed.  Constitutional:      Appearance: Normal appearance.  Cardiovascular:     Rate and Rhythm: Normal rate and regular rhythm.     Heart sounds: Normal heart sounds. No murmur heard. No friction rub. No gallop.   Pulmonary:     Effort: Pulmonary effort is normal. No respiratory distress.     Breath sounds: Normal breath sounds. No wheezing, rhonchi or rales.  Abdominal:     General: Bowel sounds are normal. There is no distension.     Palpations: Abdomen is soft.     Tenderness: There is no abdominal tenderness. There is no guarding or rebound.  Musculoskeletal:     Right lower leg: No edema.     Left lower leg: No edema.  Neurological:     Mental Status: She is alert.           Assessment & Plan:  Coronary artery disease involving native heart, unspecified vessel or lesion type, unspecified whether angina present - Plan: CBC with Differential/Platelet, COMPLETE METABOLIC PANEL WITH GFR, Lipid panel, TSH  Benign essential hypertension - Plan: CBC with Differential/Platelet, COMPLETE METABOLIC PANEL WITH GFR, Lipid panel, TSH  Acquired hypothyroidism - Plan: CBC with Differential/Platelet, COMPLETE METABOLIC PANEL WITH GFR, Lipid panel, TSH  Pure hypercholesterolemia - Plan: CBC with Differential/Platelet, COMPLETE METABOLIC PANEL WITH GFR, Lipid panel, TSH  Postmenopausal estrogen deficiency - Plan: DG Bone Density  Schedule the patient for a bone density.  Check CBC, CMP, lipid panel, TSH.  Blood pressure today is outstanding at 120/64.  Goal LDL cholesterol is less than 100.  Ensure that levothyroxine is adequately dosed by ensuring the TSH is within therapeutic range.  Recommended a booster on her COVID-vaccine.

## 2020-07-24 ENCOUNTER — Ambulatory Visit (INDEPENDENT_AMBULATORY_CARE_PROVIDER_SITE_OTHER): Payer: Medicare Other

## 2020-07-24 DIAGNOSIS — L501 Idiopathic urticaria: Secondary | ICD-10-CM

## 2020-07-24 DIAGNOSIS — L509 Urticaria, unspecified: Secondary | ICD-10-CM

## 2020-07-24 LAB — CBC WITH DIFFERENTIAL/PLATELET
Absolute Monocytes: 599 cells/uL (ref 200–950)
Basophils Absolute: 0 cells/uL (ref 0–200)
Basophils Relative: 0 %
Eosinophils Absolute: 262 cells/uL (ref 15–500)
Eosinophils Relative: 3.2 %
HCT: 40.8 % (ref 35.0–45.0)
Hemoglobin: 13.5 g/dL (ref 11.7–15.5)
Lymphs Abs: 1837 cells/uL (ref 850–3900)
MCH: 31.2 pg (ref 27.0–33.0)
MCHC: 33.1 g/dL (ref 32.0–36.0)
MCV: 94.2 fL (ref 80.0–100.0)
MPV: 9.3 fL (ref 7.5–12.5)
Monocytes Relative: 7.3 %
Neutro Abs: 5502 cells/uL (ref 1500–7800)
Neutrophils Relative %: 67.1 %
Platelets: 395 10*3/uL (ref 140–400)
RBC: 4.33 10*6/uL (ref 3.80–5.10)
RDW: 12.6 % (ref 11.0–15.0)
Total Lymphocyte: 22.4 %
WBC: 8.2 10*3/uL (ref 3.8–10.8)

## 2020-07-24 LAB — COMPLETE METABOLIC PANEL WITH GFR
AG Ratio: 1.6 (calc) (ref 1.0–2.5)
ALT: 8 U/L (ref 6–29)
AST: 16 U/L (ref 10–35)
Albumin: 4.2 g/dL (ref 3.6–5.1)
Alkaline phosphatase (APISO): 66 U/L (ref 37–153)
BUN: 10 mg/dL (ref 7–25)
CO2: 24 mmol/L (ref 20–32)
Calcium: 9.6 mg/dL (ref 8.6–10.4)
Chloride: 99 mmol/L (ref 98–110)
Creat: 0.76 mg/dL (ref 0.60–0.88)
GFR, Est African American: 79 mL/min/{1.73_m2} (ref >=60)
GFR, Est Non African American: 68 mL/min/{1.73_m2} (ref >=60)
Globulin: 2.6 g/dL (calc) (ref 1.9–3.7)
Glucose, Bld: 84 mg/dL (ref 65–99)
Potassium: 4.8 mmol/L (ref 3.5–5.3)
Sodium: 136 mmol/L (ref 135–146)
Total Bilirubin: 0.4 mg/dL (ref 0.2–1.2)
Total Protein: 6.8 g/dL (ref 6.1–8.1)

## 2020-07-24 LAB — LIPID PANEL
Cholesterol: 174 mg/dL (ref <200)
HDL: 73 mg/dL (ref >=50)
LDL Cholesterol (Calc): 83 mg/dL (calc)
Non-HDL Cholesterol (Calc): 101 mg/dL (calc) (ref <130)
Total CHOL/HDL Ratio: 2.4 (calc) (ref <5.0)
Triglycerides: 89 mg/dL (ref <150)

## 2020-07-24 LAB — TSH: TSH: 4.97 mIU/L — ABNORMAL HIGH (ref 0.40–4.50)

## 2020-07-27 ENCOUNTER — Ambulatory Visit: Payer: Medicare Other | Admitting: Family Medicine

## 2020-09-01 ENCOUNTER — Other Ambulatory Visit: Payer: Medicare Other

## 2020-09-06 ENCOUNTER — Other Ambulatory Visit: Payer: Self-pay | Admitting: Family Medicine

## 2020-09-07 ENCOUNTER — Other Ambulatory Visit: Payer: Self-pay

## 2020-09-07 ENCOUNTER — Ambulatory Visit
Admission: RE | Admit: 2020-09-07 | Discharge: 2020-09-07 | Disposition: A | Discharge status: Home / Self Care | Payer: Medicare Other | Source: Ambulatory Visit | Attending: Family Medicine | Admitting: Family Medicine

## 2020-09-07 DIAGNOSIS — Z780 Asymptomatic menopausal state: Secondary | ICD-10-CM

## 2020-09-08 ENCOUNTER — Encounter: Payer: Self-pay | Admitting: *Deleted

## 2020-09-09 ENCOUNTER — Other Ambulatory Visit: Payer: Self-pay

## 2020-09-09 ENCOUNTER — Ambulatory Visit (INDEPENDENT_AMBULATORY_CARE_PROVIDER_SITE_OTHER): Payer: Medicare Other

## 2020-09-09 DIAGNOSIS — J4540 Moderate persistent asthma, uncomplicated: Secondary | ICD-10-CM

## 2020-09-09 DIAGNOSIS — J309 Allergic rhinitis, unspecified: Secondary | ICD-10-CM

## 2020-09-11 ENCOUNTER — Other Ambulatory Visit: Payer: Self-pay | Admitting: Family Medicine

## 2020-10-05 ENCOUNTER — Telehealth: Payer: Self-pay | Admitting: *Deleted

## 2020-10-05 ENCOUNTER — Other Ambulatory Visit: Payer: Self-pay | Admitting: *Deleted

## 2020-10-05 DIAGNOSIS — E039 Hypothyroidism, unspecified: Secondary | ICD-10-CM

## 2020-10-05 NOTE — Telephone Encounter (Signed)
Patient daughter [name] in office.   Reports that patient has voiced complaints of insomnia, hair thinning, loss of appetite and increased sweating. Reports that patient feels her thyroid dose is off.   States that last check was in May 2022, and TSH noted at 4.94. advised to have patient come in for labs.   Future lab orders placed.

## 2020-10-07 ENCOUNTER — Other Ambulatory Visit: Payer: Self-pay

## 2020-10-07 ENCOUNTER — Other Ambulatory Visit: Payer: Medicare Other

## 2020-10-07 DIAGNOSIS — E039 Hypothyroidism, unspecified: Secondary | ICD-10-CM

## 2020-10-08 ENCOUNTER — Telehealth: Payer: Self-pay

## 2020-10-08 ENCOUNTER — Other Ambulatory Visit: Payer: Self-pay | Admitting: *Deleted

## 2020-10-08 DIAGNOSIS — E039 Hypothyroidism, unspecified: Secondary | ICD-10-CM

## 2020-10-08 LAB — TSH: TSH: 6.08 mIU/L — ABNORMAL HIGH (ref 0.40–4.50)

## 2020-10-08 MED ORDER — LEVOTHYROXINE SODIUM 25 MCG PO TABS: ORAL_TABLET | ORAL | 1 refills | Status: DC

## 2020-10-08 NOTE — Telephone Encounter (Signed)
Spoke with daughter regarding lab results on pt. Understands that pt is to take 39mg of the tsh med and recheck in 6 wks.

## 2020-10-20 DIAGNOSIS — L905 Scar conditions and fibrosis of skin: Secondary | ICD-10-CM | POA: Diagnosis not present

## 2020-10-20 DIAGNOSIS — D225 Melanocytic nevi of trunk: Secondary | ICD-10-CM | POA: Diagnosis not present

## 2020-10-20 DIAGNOSIS — Z1283 Encounter for screening for malignant neoplasm of skin: Secondary | ICD-10-CM | POA: Diagnosis not present

## 2020-11-25 ENCOUNTER — Other Ambulatory Visit: Payer: Medicare Other

## 2020-11-25 ENCOUNTER — Other Ambulatory Visit: Payer: Self-pay

## 2020-11-25 DIAGNOSIS — E039 Hypothyroidism, unspecified: Secondary | ICD-10-CM | POA: Diagnosis not present

## 2020-11-25 LAB — TSH: TSH: 1.44 mIU/L (ref 0.40–4.50)

## 2020-12-03 ENCOUNTER — Other Ambulatory Visit: Payer: Self-pay | Admitting: Family Medicine

## 2020-12-08 DIAGNOSIS — H524 Presbyopia: Secondary | ICD-10-CM | POA: Diagnosis not present

## 2020-12-08 DIAGNOSIS — H35363 Drusen (degenerative) of macula, bilateral: Secondary | ICD-10-CM | POA: Diagnosis not present

## 2020-12-11 ENCOUNTER — Other Ambulatory Visit: Payer: Self-pay | Admitting: Family Medicine

## 2021-02-09 ENCOUNTER — Encounter: Payer: Self-pay | Admitting: Family Medicine

## 2021-02-09 ENCOUNTER — Ambulatory Visit (INDEPENDENT_AMBULATORY_CARE_PROVIDER_SITE_OTHER): Payer: Medicare Other | Admitting: Family Medicine

## 2021-02-09 ENCOUNTER — Other Ambulatory Visit: Payer: Self-pay

## 2021-02-09 VITALS — BP 120/74 | HR 58 | Temp 97.7°F | Resp 97 | Ht 64.0 in | Wt 154.0 lb

## 2021-02-09 DIAGNOSIS — R490 Dysphonia: Secondary | ICD-10-CM

## 2021-02-09 DIAGNOSIS — E041 Nontoxic single thyroid nodule: Secondary | ICD-10-CM

## 2021-02-09 DIAGNOSIS — D167 Benign neoplasm of ribs, sternum and clavicle: Secondary | ICD-10-CM

## 2021-02-09 MED ORDER — PANTOPRAZOLE SODIUM 40 MG PO TBEC: 40.0000 mg | DELAYED_RELEASE_TABLET | Freq: Every day | ORAL | 3 refills | Status: DC

## 2021-02-09 NOTE — Progress Notes (Signed)
Subjective:    Patient ID: Patricia Morton, female    DOB: 1926-09-03, 87 y.o.   MRN: [id number]  HPI Patient is a very pleasant 87 year old Caucasian female here today due to concerns about possible thyroid cancer.  When I questioned her as to why, she has felt a mass on the proximal end of her right clavicle adjacent to the sternum.  On examination today I do not appreciate any mass.  The proximal right clavicle is more pronounced compared to the left side but there is no nodularity.  There are some minimal tenderness to palpation but I do not appreciate any type of tumor.  The patient feels like she feels a nodule on her thyroid gland.  There is a small nodularity perhaps a half a centimeter in diameter on the right lobe of the thyroid.  This is not tender.  The patient also reports constant hoarseness over the last several months.  She is having indigestion frequently.  She denies any dysphagia or hemoptysis or hematemesis or odynophasia Past Medical History:  Diagnosis Date   Anxiety    Arthritis    Cataract    surgically removed   Hyperlipemia    Hypertension    Hypothyroid    Urticaria    Past Surgical History:  Procedure Laterality Date   ABDOMINAL HYSTERECTOMY     BREAST SURGERY     right breast -lumpectomy-benign   CYSTECTOMY     EYE SURGERY     HAND SURGERY Right 02/2019   TOTAL KNEE ARTHROPLASTY Right 05/25/2015   Procedure: TOTAL KNEE ARTHROPLASTY;  Surgeon: Gaynelle Arabian, MD;  Location: [facility];  Service: Orthopedics;  Laterality: Right;   TOTAL KNEE ARTHROPLASTY Left 12/14/2015   Procedure: LEFT TOTAL KNEE ARTHROPLASTY;  Surgeon: Gaynelle Arabian, MD;  Location: [facility];  Service: Orthopedics;  Laterality: Left;   Current Outpatient Medications on File Prior to Visit  Medication Sig Dispense Refill   calcium carbonate (OSCAL) 1500 (600 Ca) MG TABS tablet Take by mouth 2 (two) times daily with a meal.     cetirizine (ZYRTEC) 10 MG tablet TAKE 1 TABLET BY MOUTH EVERY DAY 90  tablet 2   EPINEPHrine 0.3 mg/0.3 mL IJ SOAJ injection Inject 0.3 mLs (0.3 mg total) into the muscle as needed for anaphylaxis. 1 Device 1   escitalopram (LEXAPRO) 10 MG tablet TAKE 1 TABLET BY MOUTH EVERY DAY 90 tablet 2   levothyroxine (SYNTHROID) 25 MCG tablet TAKE 1 TABLET BY MOUTH EVERY DAY BEFORE breakfast, take in addition TO 50 mcg TO equal 106mcg 30 tablet 1   levothyroxine (SYNTHROID) 50 MCG tablet TAKE 1 TABLET BY MOUTH DAILY 90 tablet 1   metoprolol tartrate (LOPRESSOR) 50 MG tablet TAKE 1 TABLET BY MOUTH TWICE DAILY FOR BLOOD PRESSURE 180 tablet 1   simvastatin (ZOCOR) 40 MG tablet TAKE 1 TABLET BY MOUTH AT BEDTIME 90 tablet 1   triamterene-hydrochlorothiazide (MAXZIDE-25) 37.5-25 MG tablet TAKE 1/2 TABLET BY MOUTH EVERY MORNING 45 tablet 1   Current Facility-Administered Medications on File Prior to Visit  Medication Dose Route Frequency Provider Last Rate Last Admin   omalizumab Arvid Right) injection 150 mg  150 mg Subcutaneous Q28 days Garnet Sierras, DO   150 mg at 09/09/20 1019   Allergies  Allergen Reactions   Celexa [Citalopram Hydrobromide] Hives   Social History   Socioeconomic History   Marital status: Widowed    Spouse name: Not on file   Number of children: Not on file  Years of education: Not on file   Highest education level: Not on file  Occupational History   Not on file  Tobacco Use   Smoking status: Former    Years: 15.00    Types: Cigarettes    Start date: 25    Quit date: 2004    Years since quitting: 18.9   Smokeless tobacco: Never  Vaping Use   Vaping Use: Never used  Substance and Sexual Activity   Alcohol use: Not Currently    Comment: wine occassionally   Drug use: No   Sexual activity: Not Currently  Other Topics Concern   Not on file  Social History Narrative   Not on file   Social Determinants of Health   Financial Resource Strain: Not on file  Food Insecurity: Not on file  Transportation Needs: Not on file  Physical Activity:  Not on file  Stress: Not on file  Social Connections: Not on file  Intimate Partner Violence: Not on file     Review of Systems  All other systems reviewed and are negative.     Objective:   Physical Exam Vitals reviewed.  Constitutional:      Appearance: Normal appearance.  Neck:     Thyroid: Thyroid mass present. No thyroid tenderness.  Cardiovascular:     Rate and Rhythm: Normal rate and regular rhythm.     Heart sounds: Normal heart sounds. No murmur heard.   No friction rub. No gallop.  Pulmonary:     Effort: Pulmonary effort is normal. No respiratory distress.     Breath sounds: Normal breath sounds. No wheezing, rhonchi or rales.  Abdominal:     General: Bowel sounds are normal. There is no distension.     Palpations: Abdomen is soft.     Tenderness: There is no abdominal tenderness. There is no guarding or rebound.  Musculoskeletal:     Right lower leg: No edema.     Left lower leg: No edema.  Lymphadenopathy:     Cervical: No cervical adenopathy.  Neurological:     Mental Status: She is alert.          Assessment & Plan:  Benign neoplasm of right clavicle - Plan: DG Clavicle Right  Thyroid nodule - Plan: US THYROID  Hoarseness I am reassured that the proximal right clavicle is benign.  I try to reassure the patient that I do not appreciate any mass.  I will order an x-ray for peace of mind for the patient but I feel that this is benign.  I do appreciate a small nodule on the thyroid gland.  I will obtain an ultrasound of this to evaluate further but I believe this is likely a benign nodule based on its size.  I am concerned about the hoarseness.  We discussed a referral to ENT for laryngoscopy but the patient elects to try 3 weeks of a proton pump inhibitor, Protonix 40 mg a day and if the hoarseness persist then see ENT.

## 2021-02-10 ENCOUNTER — Ambulatory Visit: Payer: Medicare Other | Admitting: Nurse Practitioner

## 2021-02-22 ENCOUNTER — Other Ambulatory Visit: Payer: Medicare Other

## 2021-02-23 ENCOUNTER — Ambulatory Visit
Admission: RE | Admit: 2021-02-23 | Discharge: 2021-02-23 | Disposition: A | Discharge status: Home / Self Care | Payer: Medicare Other | Source: Ambulatory Visit | Attending: Family Medicine | Admitting: Family Medicine

## 2021-02-23 DIAGNOSIS — E034 Atrophy of thyroid (acquired): Secondary | ICD-10-CM | POA: Diagnosis not present

## 2021-02-23 DIAGNOSIS — E041 Nontoxic single thyroid nodule: Secondary | ICD-10-CM | POA: Diagnosis not present

## 2021-03-04 ENCOUNTER — Other Ambulatory Visit: Payer: Self-pay | Admitting: Family Medicine

## 2021-03-11 ENCOUNTER — Other Ambulatory Visit: Payer: Self-pay | Admitting: Family Medicine

## 2021-05-31 ENCOUNTER — Other Ambulatory Visit: Payer: Self-pay | Admitting: Family Medicine

## 2021-05-31 ENCOUNTER — Encounter: Payer: Self-pay | Admitting: Family Medicine

## 2021-06-01 ENCOUNTER — Encounter: Payer: Self-pay | Admitting: Family Medicine

## 2021-06-29 ENCOUNTER — Ambulatory Visit (INDEPENDENT_AMBULATORY_CARE_PROVIDER_SITE_OTHER): Payer: Medicare Other | Admitting: Family Medicine

## 2021-06-29 VITALS — BP 112/68 | HR 60 | Temp 97.0°F | Resp 18 | Ht 64.0 in | Wt 151.0 lb

## 2021-06-29 DIAGNOSIS — E039 Hypothyroidism, unspecified: Secondary | ICD-10-CM

## 2021-06-29 DIAGNOSIS — M7989 Other specified soft tissue disorders: Secondary | ICD-10-CM | POA: Diagnosis not present

## 2021-06-29 DIAGNOSIS — I2510 Atherosclerotic heart disease of native coronary artery without angina pectoris: Secondary | ICD-10-CM

## 2021-06-29 DIAGNOSIS — E782 Mixed hyperlipidemia: Secondary | ICD-10-CM | POA: Diagnosis not present

## 2021-06-29 DIAGNOSIS — I10 Essential (primary) hypertension: Secondary | ICD-10-CM | POA: Diagnosis not present

## 2021-06-29 DIAGNOSIS — E038 Other specified hypothyroidism: Secondary | ICD-10-CM | POA: Diagnosis not present

## 2021-06-29 MED ORDER — FUROSEMIDE 40 MG PO TABS: 40.0000 mg | ORAL_TABLET | Freq: Every day | ORAL | 3 refills | Status: DC

## 2021-06-29 NOTE — Progress Notes (Signed)
? ?Subjective:  ? ? Patient ID: Patricia Morton, female    DOB: 10-10-1926, 88 y.o.   MRN: [id number] ? ?HPI ?Patient is a very sweet 88 year old Caucasian female who presents today with her daughter complaining of leg swelling.  This has been a change over the last few weeks.  She reports swelling in both feet from the ankles down to her toes.  At times her toes will turn almost a purplish color.  She denies any symptoms of claudication in her legs with ambulation.  However her family has recently been instructing her to raise her legs.  Therefore today the swelling is much better after she has kept her legs elevated for the last 24 hours.  She denies any chest pain or angina.  She denies any significant dyspnea or shortness of breath.  There is no erythema in the lower legs.  There is no unilateral swelling.  It is symmetric.  Today there is trace edema in both feet distal to the ankle.  The right toes are slightly violaceous in color but she has excellent dorsalis pedis and posterior tibialis pulses bilaterally with no signs of ischemia. ?Past Medical History:  ?Diagnosis Date  ? Anxiety   ? Arthritis   ? Cataract   ? surgically removed  ? Hyperlipemia   ? Hypertension   ? Hypothyroid   ? Urticaria   ? ?Past Surgical History:  ?Procedure Laterality Date  ? ABDOMINAL HYSTERECTOMY    ? BREAST SURGERY    ? right breast -lumpectomy-benign  ? CYSTECTOMY    ? EYE SURGERY    ? HAND SURGERY Right 02/2019  ? TOTAL KNEE ARTHROPLASTY Right 05/25/2015  ? Procedure: TOTAL KNEE ARTHROPLASTY;  Surgeon: Gaynelle Arabian, MD;  Location: [facility];  Service: Orthopedics;  Laterality: Right;  ? TOTAL KNEE ARTHROPLASTY Left 12/14/2015  ? Procedure: LEFT TOTAL KNEE ARTHROPLASTY;  Surgeon: Gaynelle Arabian, MD;  Location: [facility];  Service: Orthopedics;  Laterality: Left;  ? ?Current Outpatient Medications on File Prior to Visit  ?Medication Sig Dispense Refill  ? calcium carbonate (OSCAL) 1500 (600 Ca) MG TABS tablet Take by mouth 2 (two) times  daily with a meal.    ? cetirizine (ZYRTEC) 10 MG tablet TAKE 1 TABLET BY MOUTH EVERY DAY 90 tablet 2  ? EPINEPHrine 0.3 mg/0.3 mL IJ SOAJ injection Inject 0.3 mLs (0.3 mg total) into the muscle as needed for anaphylaxis. 1 Device 1  ? escitalopram (LEXAPRO) 10 MG tablet TAKE 1 TABLET BY MOUTH EVERY DAY 90 tablet 2  ? levothyroxine (SYNTHROID) 25 MCG tablet TAKE 1 TABLET BY MOUTH EVERY DAY BEFORE breakfast, take in addition TO 50 mcg TO equal 39mg 90 tablet 1  ? levothyroxine (SYNTHROID) 50 MCG tablet TAKE 1 TABLET BY MOUTH DAILY 90 tablet 3  ? metoprolol tartrate (LOPRESSOR) 50 MG tablet TAKE 1 TABLET BY MOUTH TWICE DAILY FOR BLOOD PRESSURE 180 tablet 3  ? pantoprazole (PROTONIX) 40 MG tablet Take 1 tablet (40 mg total) by mouth daily. 90 tablet 2  ? simvastatin (ZOCOR) 40 MG tablet TAKE 1 TABLET BY MOUTH AT BEDTIME 90 tablet 3  ? triamterene-hydrochlorothiazide (MAXZIDE-25) 37.5-25 MG tablet TAKE 1/2 TABLET BY MOUTH EVERY MORNING 45 tablet 3  ? ?Current Facility-Administered Medications on File Prior to Visit  ?Medication Dose Route Frequency Provider Last Rate Last Admin  ? omalizumab (Arvid Right injection 150 mg  150 mg Subcutaneous Q28 days KGarnet Sierras DO   150 mg at 09/09/20 1019  ? ?Allergies  ?  Allergen Reactions  ? Celexa [Citalopram Hydrobromide] Hives  ? ?Social History  ? ?Socioeconomic History  ? Marital status: Widowed  ?  Spouse name: Not on file  ? Number of children: Not on file  ? Years of education: Not on file  ? Highest education level: Not on file  ?Occupational History  ? Not on file  ?Tobacco Use  ? Smoking status: Former  ?  Years: 15.00  ?  Types: Cigarettes  ?  Start date: 66  ?  Quit date: 2004  ?  Years since quitting: 19.3  ? Smokeless tobacco: Never  ?Vaping Use  ? Vaping Use: Never used  ?Substance and Sexual Activity  ? Alcohol use: Not Currently  ?  Comment: wine occassionally  ? Drug use: No  ? Sexual activity: Not Currently  ?Other Topics Concern  ? Not on file  ?Social History  Narrative  ? Not on file  ? ?Social Determinants of Health  ? ?Financial Resource Strain: Not on file  ?Food Insecurity: Not on file  ?Transportation Needs: Not on file  ?Physical Activity: Not on file  ?Stress: Not on file  ?Social Connections: Not on file  ?Intimate Partner Violence: Not on file  ? ? ? ?Review of Systems  ?All other systems reviewed and are negative. ? ?   ?Objective:  ? Physical Exam ?Vitals reviewed.  ?Constitutional:   ?   Appearance: Normal appearance.  ?Cardiovascular:  ?   Rate and Rhythm: Normal rate and regular rhythm.  ?   Heart sounds: Normal heart sounds. No murmur heard. ?  No friction rub. No gallop.  ?Pulmonary:  ?   Effort: Pulmonary effort is normal. No respiratory distress.  ?   Breath sounds: Normal breath sounds. No wheezing, rhonchi or rales.  ?Abdominal:  ?   General: Bowel sounds are normal. There is no distension.  ?   Palpations: Abdomen is soft.  ?   Tenderness: There is no abdominal tenderness. There is no guarding or rebound.  ?Musculoskeletal:  ?   Right lower leg: Edema present.  ?   Left lower leg: Edema present.  ?Neurological:  ?   Mental Status: She is alert.  ? ? ? ? ? ?   ?Assessment & Plan:  ?Leg swelling - Plan: CBC with Differential/Platelet, COMPLETE METABOLIC PANEL WITH GFR, Brain natriuretic peptide ? ?Hypothyroidism, unspecified type - Plan: TSH ?Given the sudden change in the leg swelling, I want to get an echocardiogram to evaluate the heart.  I anticipate diastolic dysfunction but I will rule out congestive heart failure.  Check CBC CMP and BNP.  Discontinue Maxide and replace with furosemide 40 mg daily.  Begin to wear knee-high compression hose 15 to 20 mmHg daily.  While checking lab work we will also check a TSH.  Wait to see how the patient responds to the change to Lasix. ?

## 2021-06-30 LAB — COMPLETE METABOLIC PANEL WITH GFR
AG Ratio: 1.7 (calc) (ref 1.0–2.5)
ALT: 12 U/L (ref 6–29)
AST: 19 U/L (ref 10–35)
Albumin: 4.3 g/dL (ref 3.6–5.1)
Alkaline phosphatase (APISO): 71 U/L (ref 37–153)
BUN: 8 mg/dL (ref 7–25)
CO2: 26 mmol/L (ref 20–32)
Calcium: 9.7 mg/dL (ref 8.6–10.4)
Chloride: 99 mmol/L (ref 98–110)
Creat: 0.9 mg/dL (ref 0.60–0.95)
Globulin: 2.5 g/dL (calc) (ref 1.9–3.7)
Glucose, Bld: 82 mg/dL (ref 65–99)
Potassium: 5.4 mmol/L — ABNORMAL HIGH (ref 3.5–5.3)
Sodium: 135 mmol/L (ref 135–146)
Total Bilirubin: 0.4 mg/dL (ref 0.2–1.2)
Total Protein: 6.8 g/dL (ref 6.1–8.1)
eGFR: 60 mL/min/{1.73_m2} (ref >=60)

## 2021-06-30 LAB — BRAIN NATRIURETIC PEPTIDE: Brain Natriuretic Peptide: 52 pg/mL (ref <100)

## 2021-06-30 LAB — CBC WITH DIFFERENTIAL/PLATELET
Absolute Monocytes: 618 cells/uL (ref 200–950)
Basophils Absolute: 13 cells/uL (ref 0–200)
Basophils Relative: 0.2 %
Eosinophils Absolute: 260 cells/uL (ref 15–500)
Eosinophils Relative: 4 %
HCT: 43.1 % (ref 35.0–45.0)
Hemoglobin: 14.3 g/dL (ref 11.7–15.5)
Lymphs Abs: 1872 cells/uL (ref 850–3900)
MCH: 30.9 pg (ref 27.0–33.0)
MCHC: 33.2 g/dL (ref 32.0–36.0)
MCV: 93.1 fL (ref 80.0–100.0)
MPV: 9.7 fL (ref 7.5–12.5)
Monocytes Relative: 9.5 %
Neutro Abs: 3738 cells/uL (ref 1500–7800)
Neutrophils Relative %: 57.5 %
Platelets: 422 10*3/uL — ABNORMAL HIGH (ref 140–400)
RBC: 4.63 10*6/uL (ref 3.80–5.10)
RDW: 12.8 % (ref 11.0–15.0)
Total Lymphocyte: 28.8 %
WBC: 6.5 10*3/uL (ref 3.8–10.8)

## 2021-06-30 LAB — TSH: TSH: 2.04 mIU/L (ref 0.40–4.50)

## 2021-07-21 ENCOUNTER — Ambulatory Visit (HOSPITAL_COMMUNITY): Payer: Medicare Other | Attending: Cardiology

## 2021-07-21 DIAGNOSIS — M7989 Other specified soft tissue disorders: Secondary | ICD-10-CM

## 2021-07-21 DIAGNOSIS — I2510 Atherosclerotic heart disease of native coronary artery without angina pectoris: Secondary | ICD-10-CM

## 2021-07-21 LAB — ECHOCARDIOGRAM COMPLETE
Area-P 1/2: 3.05 cm2
S' Lateral: 1.9 cm

## 2021-08-29 ENCOUNTER — Other Ambulatory Visit: Payer: Self-pay | Admitting: Family Medicine

## 2021-08-30 NOTE — Telephone Encounter (Signed)
Requested Prescriptions  Pending Prescriptions Disp Refills  . cetirizine (ZYRTEC) 10 MG tablet [Pharmacy Med Name: cetirizine 10 mg tablet] 90 tablet 2    Sig: TAKE 1 TABLET BY MOUTH EVERY DAY     Ear, Nose, and Throat:  Antihistamines 2 Passed - 08/30/2021 12:59 PM      Passed - Cr in normal range and within 360 days    Creat  Date Value Ref Range Status  06/29/2021 0.90 0.60 - 0.95 mg/dL Final         Passed - Valid encounter within last 12 months    Recent Outpatient Visits          2 months ago Leg swelling   [place] Susy Frizzle, MD   6 months ago Benign neoplasm of right clavicle   [place] Susy Frizzle, MD   1 year ago Coronary artery disease involving native heart, unspecified vessel or lesion type, unspecified whether angina present   [place] Pickard, Cammie Mcgee, MD   1 year ago Benign essential hypertension   [place], Modena Nunnery, MD   1 year ago Benign essential hypertension   [place], Modena Nunnery, MD             . escitalopram (LEXAPRO) 10 MG tablet [Pharmacy Med Name: escitalopram 10 mg tablet] 90 tablet 1    Sig: TAKE 1 TABLET BY [place]     Psychiatry:  Antidepressants - SSRI Failed - 08/30/2021 12:59 PM      Failed - Completed PHQ-2 or PHQ-9 in the last 360 days      Passed - Valid encounter within last 6 months    Recent Outpatient Visits          2 months ago Leg swelling   [place] Susy Frizzle, MD   6 months ago Benign neoplasm of right clavicle   [place] Susy Frizzle, MD   1 year ago Coronary artery disease involving native heart, unspecified vessel or lesion type, unspecified whether angina present   [place] Pickard, Cammie Mcgee, MD   1 year ago Benign essential hypertension   [place], Modena Nunnery, MD   1 year ago Benign  essential hypertension   [place], Modena Nunnery, MD             . levothyroxine (SYNTHROID) 25 MCG tablet [Pharmacy Med Name: levothyroxine 25 mcg tablet] 90 tablet 0    Sig: TAKE 1 TABLET BY MOUTH EVERY DAY BEFORE breakfast, take in addition TO 50 mcg TO equal 15mg     Endocrinology:  Hypothyroid Agents Passed - 08/30/2021 12:59 PM      Passed - TSH in normal range and within 360 days    TSH  Date Value Ref Range Status  06/29/2021 2.04 0.40 - 4.50 mIU/L Final         Passed - Valid encounter within last 12 months    Recent Outpatient Visits          2 months ago Leg swelling   [place]   6 months ago Benign neoplasm of right clavicle   [place]   1 year ago Coronary artery disease involving native heart, unspecified vessel or lesion type, unspecified whether angina present   [place]  [facility] Pickard, Cammie Mcgee, MD   1 year ago Benign essential hypertension   [place], Modena Nunnery, MD   1 year ago Benign essential hypertension   [place], Modena Nunnery, MD

## 2021-09-26 ENCOUNTER — Telehealth: Payer: Medicare Other | Admitting: Family

## 2021-09-26 DIAGNOSIS — H00012 Hordeolum externum right lower eyelid: Secondary | ICD-10-CM

## 2021-09-26 MED ORDER — NEOMYCIN-BACITRACIN ZN-POLYMYX 5-400-10000 OP OINT: 1.0000 | TOPICAL_OINTMENT | Freq: Four times a day (QID) | OPHTHALMIC | 0 refills | Status: AC

## 2021-09-26 NOTE — Progress Notes (Signed)
  E-Visit for Stye   We are sorry that you are not feeling well. Here is how we plan to help!  Based on what you have shared with me it looks like you have a stye.  A stye is an inflammation of the eyelid.  It is often a red, painful lump near the edge of the eyelid that may look like a boil or a pimple.  A stye develops when an infection occurs at the base of an eyelash.   We have made appropriate suggestions for you based upon your presentation: Simple styes can be treated without medical intervention.  Most styes either resolve spontaneously or resolve with simple home treatment by applying warm compresses or heated washcloth to the stye for about 10-15 minutes three to four times a day. This causes the stye to drain and resolve.   I have sent in neomycin,bacitracin-polymyxin ophthalmic ointment  four times a day.   HOME CARE:  Wash your hands often! Let the stye open on its own. Don't squeeze or open it. Don't rub your eyes. This can irritate your eyes and let in bacteria.  If you need to touch your eyes, wash your hands first. Don't wear eye makeup or contact lenses until the area has healed.  GET HELP RIGHT AWAY IF:  Your symptoms do not improve. You develop blurred or loss of vision. Your symptoms worsen (increased discharge, pain or redness).   Thank you for choosing an e-visit.  Your e-visit answers were reviewed by a board certified advanced clinical practitioner to complete your personal care plan. Depending upon the condition, your plan could have included both over the counter or prescription medications.  Please review your pharmacy choice. Make sure the pharmacy is open so you can pick up prescription now. If there is a problem, you may contact your provider through [employer] and have the prescription routed to another pharmacy.  Your safety is important to [place]. If you have drug allergies check your prescription carefully.   For the next 24 hours you can use  MyChart to ask questions about today's visit, request a non-urgent call back, or ask for a work or school excuse. You will get an email in the next two days asking about your experience. I hope that your e-visit has been valuable and will speed your recovery.  Approximately 5 minutes was spent documenting and reviewing patient's chart.

## 2021-10-29 ENCOUNTER — Other Ambulatory Visit: Payer: Self-pay | Admitting: Family Medicine

## 2021-11-03 DIAGNOSIS — Z1283 Encounter for screening for malignant neoplasm of skin: Secondary | ICD-10-CM | POA: Diagnosis not present

## 2021-11-03 DIAGNOSIS — D225 Melanocytic nevi of trunk: Secondary | ICD-10-CM | POA: Diagnosis not present

## 2021-11-03 DIAGNOSIS — L820 Inflamed seborrheic keratosis: Secondary | ICD-10-CM | POA: Diagnosis not present

## 2021-11-03 DIAGNOSIS — L905 Scar conditions and fibrosis of skin: Secondary | ICD-10-CM | POA: Diagnosis not present

## 2021-11-19 ENCOUNTER — Other Ambulatory Visit: Payer: Self-pay | Admitting: Family Medicine

## 2022-02-01 DIAGNOSIS — H43813 Vitreous degeneration, bilateral: Secondary | ICD-10-CM | POA: Diagnosis not present

## 2022-02-15 ENCOUNTER — Ambulatory Visit (INDEPENDENT_AMBULATORY_CARE_PROVIDER_SITE_OTHER): Payer: Medicare Other

## 2022-02-15 VITALS — BP 128/68 | Ht 64.0 in | Wt 153.0 lb

## 2022-02-15 DIAGNOSIS — Z0000 Encounter for general adult medical examination without abnormal findings: Secondary | ICD-10-CM | POA: Diagnosis not present

## 2022-02-15 NOTE — Progress Notes (Signed)
Subjective:   Patricia Morton is a 88 y.o. female who presents for Medicare Annual (Subsequent) preventive examination.  Review of Systems     Cardiac Risk Factors include: advanced age (>26mn, >>81women);hypertension;dyslipidemia     Objective:    Today's Vitals   02/15/22 1438  BP: 128/68  Weight: 153 lb (69.4 kg)  Height: '5\' 4"'$  (1.626 m)   Body mass index is 26.26 kg/m.     02/15/2022    3:09 PM 04/30/2019    9:36 AM 12/14/2015    1:30 PM 12/14/2015    8:08 AM 12/04/2015   11:02 AM 05/25/2015   12:07 PM 05/22/2015   10:42 AM  Advanced Directives  Does Patient Have a Medical Advance Directive? Yes No Yes Yes Yes Yes Yes  Type of APrimary school teacherof AE[place] will Living will;Healthcare Power of [place] will Living will;Healthcare Power of Attorney  Does patient want to make changes to medical advance directive? No - Patient declined  No - Patient declined  No - Patient declined No - Patient declined   Copy of [place] Chart? No - copy requested  No - copy requested No - copy requested No - copy requested  No - copy requested  Would patient like information on creating a medical advance directive?  Yes (MAU/Ambulatory/Procedural Areas - Information given)         Current Medications (verified) Outpatient Encounter Medications as of 02/15/2022  Medication Sig   calcium carbonate (OSCAL) 1500 (600 Ca) MG TABS tablet Take by mouth 2 (two) times daily with a meal.   cetirizine (ZYRTEC) 10 MG tablet TAKE 1 TABLET BY MOUTH EVERY DAY   EPINEPHrine 0.3 mg/0.3 mL IJ SOAJ injection Inject 0.3 mLs (0.3 mg total) into the muscle as needed for anaphylaxis.   escitalopram (LEXAPRO) 10 MG tablet TAKE 1 TABLET BY MOUTH EVERY DAY   furosemide (LASIX) 40 MG tablet TAKE 1 TABLET BY MOUTH DAILY   levothyroxine (SYNTHROID) 25 MCG tablet TAKE 1 TABLET BY MOUTH EVERY DAY BEFORE  breakfast, take in addition TO 50 mcg TO equal 746m   levothyroxine (SYNTHROID) 50 MCG tablet TAKE 1 TABLET BY MOUTH DAILY   metoprolol tartrate (LOPRESSOR) 50 MG tablet TAKE 1 TABLET BY MOUTH TWICE DAILY FOR BLOOD PRESSURE   neomycin-bacitracin-polymyxin (NEOSPORIN) ophthalmic ointment Place 1 Application into the right eye 4 (four) times daily.   pantoprazole (PROTONIX) 40 MG tablet Take 1 tablet (40 mg total) by mouth daily.   simvastatin (ZOCOR) 40 MG tablet TAKE 1 TABLET BY MOUTH AT BEDTIME   triamterene-hydrochlorothiazide (MAXZIDE-25) 37.5-25 MG tablet TAKE 1/2 TABLET BY MOUTH EVERY MORNING   Facility-Administered Encounter Medications as of 02/15/2022  Medication   omalizumab (XArvid Rightinjection 150 mg    Allergies (verified) Celexa [citalopram hydrobromide]   History: Past Medical History:  Diagnosis Date   Anxiety    Arthritis    Cataract    surgically removed   Hyperlipemia    Hypertension    Hypothyroid    Urticaria    Past Surgical History:  Procedure Laterality Date   ABDOMINAL HYSTERECTOMY     BREAST SURGERY     right breast -lumpectomy-benign   CYSTECTOMY     EYE SURGERY     HAND SURGERY Right 02/2019   TOTAL KNEE ARTHROPLASTY Right 05/25/2015   Procedure: TOTAL KNEE ARTHROPLASTY;  Surgeon: FrGaynelle ArabianMD;  Location: [facility];  Service:  Orthopedics;  Laterality: Right;   TOTAL KNEE ARTHROPLASTY Left 12/14/2015   Procedure: LEFT TOTAL KNEE ARTHROPLASTY;  Surgeon: Gaynelle Arabian, MD;  Location: [facility];  Service: Orthopedics;  Laterality: Left;   Family History  Problem Relation Age of Onset   Heart disease Brother    Hypothyroidism Mother    Hypotension Sister    Allergic rhinitis Neg Hx    Asthma Neg Hx    Eczema Neg Hx    Urticaria Neg Hx    Social History   Socioeconomic History   Marital status: Widowed    Spouse name: Not on file   Number of children: 2   Years of education: Not on file   Highest education level: Not on file  Occupational  History   Not on file  Tobacco Use   Smoking status: Former    Years: 15.00    Types: Cigarettes    Start date: 65    Quit date: 2004    Years since quitting: 19.9   Smokeless tobacco: Never  Vaping Use   Vaping Use: Never used  Substance and Sexual Activity   Alcohol use: Not Currently    Comment: wine occassionally   Drug use: No   Sexual activity: Not Currently  Other Topics Concern   Not on file  Social History Narrative   Daughter is primary caregiver    Worked at [employer] as Scientist, water quality    Social Determinants of Health   Financial Resource Strain: [place]  (02/15/2022)   Overall Financial Resource Strain (CARDIA)    Difficulty of Paying Living Expenses: Not hard at all  Food Insecurity: No Food Insecurity (02/15/2022)   Hunger Vital Sign    Worried About Running Out of Food in the Last Year: Never true    [place] in the Last Year: Never true  Transportation Needs: No Transportation Needs (02/15/2022)   PRAPARE - Hydrologist (Medical): No    Lack of Transportation (Non-Medical): No  Physical Activity: Unknown (02/15/2022)   Exercise Vital Sign    Days of Exercise per Week: Not on file    Minutes of Exercise per Session: 0 min  Stress: No Stress Concern Present (02/15/2022)   [place]    Feeling of Stress : Not at all  Social Connections: Moderately Integrated (02/15/2022)   Social Connection and Isolation Panel [NHANES]    Frequency of Communication with Friends and Family: More than three times a week    Frequency of Social Gatherings with Friends and Family: Three times a week    Attends Religious Services: More than 4 times per year    Active Member of Clubs or Organizations: Yes    Attends Archivist Meetings: More than 4 times per year    Marital Status: Widowed    Tobacco Counseling Counseling given: Not Answered   Clinical  Intake:  Pre-visit preparation completed: Yes  Pain : No/denies pain        How often do you need to have someone help you when you read instructions, pamphlets, or other written materials from your doctor or pharmacy?: 2 - Rarely  Diabetic?No  Interpreter Needed?: No  Information entered by :: Denman George LPN   Activities of Daily Living    02/15/2022    3:09 PM  In your present state of health, do you have any difficulty performing the following activities:  Hearing? 1  Vision?  0  Difficulty concentrating or making decisions? 0  Walking or climbing stairs? 1  Dressing or bathing? 0  Doing errands, shopping? 0  Preparing Food and eating ? N  Using the Toilet? N  In the past six months, have you accidently leaked urine? N  Do you have problems with loss of bowel control? N  Managing your Medications? N  Managing your Finances? N  Housekeeping or managing your Housekeeping? N    Patient Care Team: Susy Frizzle, MD as PCP - General (Family Medicine)  Indicate any recent Medical Services you may have received from other than [facility] providers in the past year (date may be approximate).     Assessment:   This is a routine wellness examination for [place].  Hearing/Vision screen Hearing Screening - Comments:: Wears bilateral hearing aids; hard of hearing   Vision Screening - Comments:: Up to date with routine eye exams with Dr. Leticia Clas    Dietary issues and exercise activities discussed: Current Exercise Habits: The patient does not participate in regular exercise at present   Goals Addressed             This Visit's Progress    Prevent falls        Depression Screen    02/15/2022    3:08 PM 07/23/2020   11:23 AM 04/30/2019    9:36 AM 02/07/2018    9:04 AM  PHQ 2/9 Scores  PHQ - 2 Score 0 0 1 3  PHQ- 9 Score    9    Fall Risk    02/15/2022    2:58 PM 07/23/2020   11:23 AM 04/30/2019    9:36 AM 02/07/2018    9:04 AM  Fall Risk   Falls  in the past year? 1 0 0 0  Number falls in past yr: 1   0  Injury with Fall? 1   0  Risk for fall due to : History of fall(s);Impaired balance/gait No Fall Risks No Fall Risks   Follow up Education provided;Falls prevention discussed;Falls evaluation completed Falls evaluation completed Falls evaluation completed Falls evaluation completed;Falls prevention discussed    FALL RISK PREVENTION PERTAINING TO THE HOME:  Any stairs in or around the home? Yes  If so, are there any without handrails? No  Home free of loose throw rugs in walkways, pet beds, electrical cords, etc? Yes  Adequate lighting in your home to reduce risk of falls? Yes   ASSISTIVE DEVICES UTILIZED TO PREVENT FALLS:  Life alert? Yes  Use of a cane, walker or w/c? Yes  Grab bars in the bathroom? Yes  Shower chair or bench in shower? Yes  Elevated toilet seat or a handicapped toilet? Yes   TIMED UP AND GO:  Was the test performed? Yes .  Length of time to ambulate 10 feet: 5 sec.   Gait slow and steady without use of assistive device  Cognitive Function:        02/15/2022    2:51 PM  6CIT Screen  What Year? 0 points  What month? 0 points  What time? 0 points  Count back from 20 0 points  Months in reverse 0 points  Repeat phrase 0 points  Total Score 0 points    Immunizations Immunization History  Administered Date(s) Administered   Influenza, High Dose Seasonal PF 12/22/2017, 12/10/2018   Influenza,inj,Quad PF,6+ Mos 12/27/2019   Influenza-Unspecified 12/10/2013, 12/03/2014, 12/06/2016, 12/12/2020   PFIZER(Purple Top)SARS-COV-2 Vaccination 03/29/2019, 04/19/2019   Pneumococcal Conjugate-13 12/10/2013  Pneumococcal Polysaccharide-23 01/13/1995   Tdap 07/24/2012   Zoster Recombinat (Shingrix) 07/25/2017, 10/23/2017    TDAP status: Up to date  Flu Vaccine status: Up to date  Pneumococcal vaccine status: Up to date  Covid-19 vaccine status: Information provided on how to obtain vaccines.    Qualifies for Shingles Vaccine? Yes   Zostavax completed No   Shingrix Completed?: Yes  Screening Tests Health Maintenance  Topic Date Due   INFLUENZA VACCINE  10/12/2021   COVID-19 Vaccine (3 - 2023-24 season) 11/12/2021   DTaP/Tdap/Td (2 - Td or Tdap) 07/25/2022   Medicare Annual Wellness (AWV)  02/16/2023   Pneumonia Vaccine 25+ Years old  Completed   DEXA SCAN  Completed   Zoster Vaccines- Shingrix  Completed   HPV VACCINES  Aged Out    Health Maintenance  Health Maintenance Due  Topic Date Due   INFLUENZA VACCINE  10/12/2021   COVID-19 Vaccine (3 - 2023-24 season) 11/12/2021    Colorectal cancer screening: No longer required.   Mammogram status: No longer required due to age.  Bone Density status: Completed 09/07/20. Results reflect: Bone density results: OSTEOPOROSIS. Repeat every 2 years.  Lung Cancer Screening: (Low Dose CT Chest recommended if Age 61-80 years, 30 pack-year currently smoking OR have quit w/in 15years.) does not qualify.   Lung Cancer Screening Referral: n/a  Additional Screening:  Hepatitis C Screening: does not qualify;   Vision Screening: Recommended annual ophthalmology exams for early detection of glaucoma and other disorders of the eye. Is the patient up to date with their annual eye exam?  Yes  Who is the provider or what is the name of the office in which the patient attends annual eye exams? Dr. Leticia Clas  If pt is not established with a provider, would they like to be referred to a provider to establish care?  N/a .   Dental Screening: Recommended annual dental exams for proper oral hygiene  Community Resource Referral / Chronic Care Management: CRR required this visit?  No   CCM required this visit?  No      Plan:     I have personally reviewed and noted the following in the patient's chart:   Medical and social history Use of alcohol, tobacco or illicit drugs  Current medications and supplements including opioid  prescriptions. Patient is not currently taking opioid prescriptions. Functional ability and status Nutritional status Physical activity Advanced directives List of other physicians Hospitalizations, surgeries, and ER visits in previous 12 months Vitals Screenings to include cognitive, depression, and falls Referrals and appointments  In addition, I have reviewed and discussed with patient certain preventive protocols, quality metrics, and best practice recommendations. A written personalized care plan for preventive services as well as general preventive health recommendations were provided to patient.     Denman George [place], [place]   [id number]   Nurse Notes: No concerns; patient scheduled for routine follow up with provider

## 2022-02-15 NOTE — Patient Instructions (Signed)
Patricia Morton , Thank you for taking time to come for your Medicare Wellness Visit. I appreciate your ongoing commitment to your health goals. Please review the following plan we discussed and let me know if I can assist you in the future.   These are the goals we discussed:  Goals   None     This is a list of the screening recommended for you and due dates:  Health Maintenance  Topic Date Due   Flu Shot  10/12/2021   COVID-19 Vaccine (3 - 2023-24 season) 11/12/2021   DTaP/Tdap/Td vaccine (2 - Td or Tdap) 07/25/2022   Medicare Annual Wellness Visit  02/16/2023   Pneumonia Vaccine  Completed   DEXA scan (bone density measurement)  Completed   Zoster (Shingles) Vaccine  Completed   HPV Vaccine  Aged Out    Advanced directives: Please bring a copy of your health care power of attorney and living will to the office to be added to your chart at your convenience.   Conditions/risks identified: Aim for 30 minutes of exercise or brisk walking, 6-8 glasses of water, and 5 servings of fruits and vegetables each day.   Next appointment: Follow up in one year for your annual wellness visit    Preventive Care 65 Years and Older, Female Preventive care refers to lifestyle choices and visits with your health care provider that can promote health and wellness. What does preventive care include? A yearly physical exam. This is also called an annual well check. Dental exams once or twice a year. Routine eye exams. Ask your health care provider how often you should have your eyes checked. Personal lifestyle choices, including: Daily care of your teeth and gums. Regular physical activity. Eating a healthy diet. Avoiding tobacco and drug use. Limiting alcohol use. Practicing safe sex. Taking low-dose aspirin every day. Taking vitamin and mineral supplements as recommended by your health care provider. What happens during an annual well check? The services and screenings done by your health care  provider during your annual well check will depend on your age, overall health, lifestyle risk factors, and family history of disease. Counseling  Your health care provider may ask you questions about your: Alcohol use. Tobacco use. Drug use. Emotional well-being. Home and relationship well-being. Sexual activity. Eating habits. History of falls. Memory and ability to understand (cognition). Work and work Statistician. Reproductive health. Screening  You may have the following tests or measurements: Height, weight, and BMI. Blood pressure. Lipid and cholesterol levels. These may be checked every 5 years, or more frequently if you are over 51 years old. Skin check. Lung cancer screening. You may have this screening every year starting at age 88 if you have a 30-pack-year history of smoking and currently smoke or have quit within the past 15 years. Fecal occult blood test (FOBT) of the stool. You may have this test every year starting at age 3. Flexible sigmoidoscopy or colonoscopy. You may have a sigmoidoscopy every 5 years or a colonoscopy every 10 years starting at age 81. Hepatitis C blood test. Hepatitis B blood test. Sexually transmitted disease (STD) testing. Diabetes screening. This is done by checking your blood sugar (glucose) after you have not eaten for a while (fasting). You may have this done every 1-3 years. Bone density scan. This is done to screen for osteoporosis. You may have this done starting at age 29. Mammogram. This may be done every 1-2 years. Talk to your health care provider about how often you  should have regular mammograms. Talk with your health care provider about your test results, treatment options, and if necessary, the need for more tests. Vaccines  Your health care provider may recommend certain vaccines, such as: Influenza vaccine. This is recommended every year. Tetanus, diphtheria, and acellular pertussis (Tdap, Td) vaccine. You may need a Td  booster every 10 years. Zoster vaccine. You may need this after age 62. Pneumococcal 13-valent conjugate (PCV13) vaccine. One dose is recommended after age 76. Pneumococcal polysaccharide (PPSV23) vaccine. One dose is recommended after age 38. Talk to your health care provider about which screenings and vaccines you need and how often you need them. This information is not intended to replace advice given to you by your health care provider. Make sure you discuss any questions you have with your health care provider. Document Released: 03/27/2015 Document Revised: 11/18/2015 Document Reviewed: 12/30/2014 Elsevier Interactive Patient Education  2017 [place] Prevention in the Home Falls can cause injuries. They can happen to people of all ages. There are many things you can do to make your home safe and to help prevent falls. What can I do on the outside of my home? Regularly fix the edges of walkways and driveways and fix any cracks. Remove anything that might make you trip as you walk through a door, such as a raised step or threshold. Trim any bushes or trees on the path to your home. Use bright outdoor lighting. Clear any walking paths of anything that might make someone trip, such as rocks or tools. Regularly check to see if handrails are loose or broken. Make sure that both sides of any steps have handrails. Any raised decks and porches should have guardrails on the edges. Have any leaves, snow, or ice cleared regularly. Use sand or salt on walking paths during winter. Clean up any spills in your garage right away. This includes oil or grease spills. What can I do in the bathroom? Use night lights. Install grab bars by the toilet and in the tub and shower. Do not use towel bars as grab bars. Use non-skid mats or decals in the tub or shower. If you need to sit down in the shower, use a plastic, non-slip stool. Keep the floor dry. Clean up any water that spills on the floor  as soon as it happens. Remove soap buildup in the tub or shower regularly. Attach bath mats securely with double-sided non-slip rug tape. Do not have throw rugs and other things on the floor that can make you trip. What can I do in the bedroom? Use night lights. Make sure that you have a light by your bed that is easy to reach. Do not use any sheets or blankets that are too big for your bed. They should not hang down onto the floor. Have a firm chair that has side arms. You can use this for support while you get dressed. Do not have throw rugs and other things on the floor that can make you trip. What can I do in the kitchen? Clean up any spills right away. Avoid walking on wet floors. Keep items that you use a lot in easy-to-reach places. If you need to reach something above you, use a strong step stool that has a grab bar. Keep electrical cords out of the way. Do not use floor polish or wax that makes floors slippery. If you must use wax, use non-skid floor wax. Do not have throw rugs and other things on the  floor that can make you trip. What can I do with my stairs? Do not leave any items on the stairs. Make sure that there are handrails on both sides of the stairs and use them. Fix handrails that are broken or loose. Make sure that handrails are as long as the stairways. Check any carpeting to make sure that it is firmly attached to the stairs. Fix any carpet that is loose or worn. Avoid having throw rugs at the top or bottom of the stairs. If you do have throw rugs, attach them to the floor with carpet tape. Make sure that you have a light switch at the top of the stairs and the bottom of the stairs. If you do not have them, ask someone to add them for you. What else can I do to help prevent falls? Wear shoes that: Do not have high heels. Have rubber bottoms. Are comfortable and fit you well. Are closed at the toe. Do not wear sandals. If you use a stepladder: Make sure that it is  fully opened. Do not climb a closed stepladder. Make sure that both sides of the stepladder are locked into place. Ask someone to hold it for you, if possible. Clearly mark and make sure that you can see: Any grab bars or handrails. First and last steps. Where the edge of each step is. Use tools that help you move around (mobility aids) if they are needed. These include: Canes. Walkers. Scooters. Crutches. Turn on the lights when you go into a dark area. Replace any light bulbs as soon as they burn out. Set up your furniture so you have a clear path. Avoid moving your furniture around. If any of your floors are uneven, fix them. If there are any pets around you, be aware of where they are. Review your medicines with your doctor. Some medicines can make you feel dizzy. This can increase your chance of falling. Ask your doctor what other things that you can do to help prevent falls. This information is not intended to replace advice given to you by your health care provider. Make sure you discuss any questions you have with your health care provider. Document Released: 12/25/2008 Document Revised: 08/06/2015 Document Reviewed: 04/04/2014 Elsevier Interactive Patient Education  2017 [employer].

## 2022-02-19 ENCOUNTER — Other Ambulatory Visit: Payer: Self-pay | Admitting: Family Medicine

## 2022-02-22 ENCOUNTER — Ambulatory Visit: Payer: Medicare Other | Admitting: Family Medicine

## 2022-02-24 ENCOUNTER — Ambulatory Visit (INDEPENDENT_AMBULATORY_CARE_PROVIDER_SITE_OTHER): Payer: Medicare Other | Admitting: Family Medicine

## 2022-02-24 ENCOUNTER — Ambulatory Visit: Payer: Medicare Other | Admitting: Family Medicine

## 2022-02-24 ENCOUNTER — Encounter: Payer: Self-pay | Admitting: Family Medicine

## 2022-02-24 VITALS — BP 118/62 | HR 62 | Ht 64.0 in | Wt 153.8 lb

## 2022-02-24 DIAGNOSIS — E039 Hypothyroidism, unspecified: Secondary | ICD-10-CM | POA: Diagnosis not present

## 2022-02-24 DIAGNOSIS — I10 Essential (primary) hypertension: Secondary | ICD-10-CM

## 2022-02-24 DIAGNOSIS — I2510 Atherosclerotic heart disease of native coronary artery without angina pectoris: Secondary | ICD-10-CM

## 2022-02-24 NOTE — Progress Notes (Signed)
Subjective:    Patient ID: Patricia Morton, female    DOB: October 11, 1926, 88 y.o.   MRN: [id number]  HPI Patient is here today with her daughter.  She has fallen 2 times.  Once was walking down steps into her been trying to carry objects.  The other time she lost her balance due to disequilibrium.  She is on a fluid pill/Lasix.  She is no longer taking try and tearing.  She is also on a blood pressure pill metoprolol.  Her blood pressure today is very low for her age.  She denies any syncope or irregular heartbeats.  She denies any vertigo.  She is not exercising regularly.  She does have grab bars in the shower.  She has an elevated toilet seat.  They have removed all of the rugs from her home.  She has nightlights to help like the way to the bathroom and down the hallways. Past Medical History:  Diagnosis Date   Anxiety    Arthritis    Cataract    surgically removed   Hyperlipemia    Hypertension    Hypothyroid    Urticaria    Past Surgical History:  Procedure Laterality Date   ABDOMINAL HYSTERECTOMY     BREAST SURGERY     right breast -lumpectomy-benign   CYSTECTOMY     EYE SURGERY     HAND SURGERY Right 02/2019   TOTAL KNEE ARTHROPLASTY Right 05/25/2015   Procedure: TOTAL KNEE ARTHROPLASTY;  Surgeon: Gaynelle Arabian, MD;  Location: [facility];  Service: Orthopedics;  Laterality: Right;   TOTAL KNEE ARTHROPLASTY Left 12/14/2015   Procedure: LEFT TOTAL KNEE ARTHROPLASTY;  Surgeon: Gaynelle Arabian, MD;  Location: [facility];  Service: Orthopedics;  Laterality: Left;   Current Outpatient Medications on File Prior to Visit  Medication Sig Dispense Refill   calcium carbonate (OSCAL) 1500 (600 Ca) MG TABS tablet Take by mouth 2 (two) times daily with a meal.     cetirizine (ZYRTEC) 10 MG tablet TAKE 1 TABLET BY MOUTH EVERY DAY 90 tablet 2   EPINEPHrine 0.3 mg/0.3 mL IJ SOAJ injection Inject 0.3 mLs (0.3 mg total) into the muscle as needed for anaphylaxis. 1 Device 1   escitalopram (LEXAPRO) 10 MG  tablet TAKE 1 TABLET BY MOUTH EVERY DAY 90 tablet 1   furosemide (LASIX) 40 MG tablet TAKE 1 TABLET BY MOUTH DAILY 90 tablet 0   levothyroxine (SYNTHROID) 25 MCG tablet TAKE 1 TABLET BY MOUTH EVERY DAY BEFORE breakfast, take in addition TO 50 mcg TO equal 27mg 90 tablet 2   levothyroxine (SYNTHROID) 50 MCG tablet TAKE 1 TABLET BY MOUTH DAILY 90 tablet 1   metoprolol tartrate (LOPRESSOR) 50 MG tablet TAKE 1 TABLET BY MOUTH TWICE DAILY FOR BLOOD PRESSURE 180 tablet 1   neomycin-bacitracin-polymyxin (NEOSPORIN) ophthalmic ointment Place 1 Application into the right eye 4 (four) times daily. 3.5 g 0   pantoprazole (PROTONIX) 40 MG tablet Take 1 tablet (40 mg total) by mouth daily. 90 tablet 2   simvastatin (ZOCOR) 40 MG tablet TAKE 1 TABLET BY MOUTH AT BEDTIME 90 tablet 1   triamterene-hydrochlorothiazide (MAXZIDE-25) 37.5-25 MG tablet TAKE 1/2 TABLET BY MOUTH EVERY MORNING 45 tablet 1   Current Facility-Administered Medications on File Prior to Visit  Medication Dose Route Frequency Provider Last Rate Last Admin   omalizumab (Arvid Right injection 150 mg  150 mg Subcutaneous Q28 days KGarnet Sierras DO   150 mg at 09/09/20 1019   Allergies  Allergen Reactions  Celexa [Citalopram Hydrobromide] Hives   Social History   Socioeconomic History   Marital status: Widowed    Spouse name: Not on file   Number of children: 2   Years of education: Not on file   Highest education level: Not on file  Occupational History   Not on file  Tobacco Use   Smoking status: Former    Years: 15.00    Types: Cigarettes    Start date: 76    Quit date: 2004    Years since quitting: 19.9   Smokeless tobacco: Never  Vaping Use   Vaping Use: Never used  Substance and Sexual Activity   Alcohol use: Not Currently    Comment: wine occassionally   Drug use: No   Sexual activity: Not Currently  Other Topics Concern   Not on file  Social History Narrative   Daughter is primary caregiver    Worked at [employer] as Scientist, water quality    Social Determinants of Health   Financial Resource Strain: [place]  (02/15/2022)   Overall Financial Resource Strain (CARDIA)    Difficulty of Paying Living Expenses: Not hard at all  Food Insecurity: No Food Insecurity (02/15/2022)   Hunger Vital Sign    Worried About Running Out of Food in the Last Year: Never true    [place] in the Last Year: Never true  Transportation Needs: No Transportation Needs (02/15/2022)   PRAPARE - Hydrologist (Medical): No    Lack of Transportation (Non-Medical): No  Physical Activity: Unknown (02/15/2022)   Exercise Vital Sign    Days of Exercise per Week: Not on file    Minutes of Exercise per Session: 0 min  Stress: No Stress Concern Present (02/15/2022)   [place]    Feeling of Stress : Not at all  Social Connections: Moderately Integrated (02/15/2022)   Social Connection and Isolation Panel [NHANES]    Frequency of Communication with Friends and Family: More than three times a week    Frequency of Social Gatherings with Friends and Family: Three times a week    Attends Religious Services: More than 4 times per year    Active Member of Clubs or Organizations: Yes    Attends Archivist Meetings: More than 4 times per year    Marital Status: Widowed  Intimate Partner Violence: Not At Risk (02/15/2022)   Humiliation, Afraid, Rape, and Kick questionnaire    Fear of Current or Ex-Partner: No    Emotionally Abused: No    Physically Abused: No    Sexually Abused: No     Review of Systems  All other systems reviewed and are negative.      Objective:   Physical Exam Vitals reviewed.  Constitutional:      Appearance: Normal appearance.  Cardiovascular:     Rate and Rhythm: Normal rate and regular rhythm.     Heart sounds: Normal heart sounds. No murmur heard.    No friction rub. No gallop.  Pulmonary:     Effort:  Pulmonary effort is normal. No respiratory distress.     Breath sounds: Normal breath sounds. No wheezing, rhonchi or rales.  Abdominal:     General: Bowel sounds are normal. There is no distension.     Palpations: Abdomen is soft.     Tenderness: There is no abdominal tenderness. There is no guarding or rebound.  Musculoskeletal:  Right lower leg: No edema.     Left lower leg: No edema.  Neurological:     Mental Status: She is alert.           Assessment & Plan:  Acquired hypothyroidism - Plan: CBC with Differential/Platelet, COMPLETE METABOLIC PANEL WITH GFR, TSH  Coronary artery disease involving native heart, unspecified vessel or lesion type, unspecified whether angina present  Benign essential hypertension Last time I saw the patient and put her on Lasix.  The fall seem to get worse after starting her on Lasix.  Therefore I will stop the medication.  If there is no significant change in her edema, hopefully this will help raise her blood pressure and avoid dehydration.  If her blood pressure remains soft we may need to wean her off metoprolol.  Recommended exercises to try to strengthen her quadriceps, hamstrings, and gluteus muscles.  She has a stationary bike that she can use while sitting in her chair.  I recommended doing this to try to strengthen her leg muscles.  We also discussed stand up and sit down exercises to be done in a safe manner.  We also discussed troubleshooting techniques to help prevent falls in the home.  Biggest issue seems to be the 2 steps into her den.  I recommended lighting that area so that she can see the steps better.  Also recommended putting up grab bars next to the steps.  Daughter states that they are already rails there but the patient forgets to use them.  We also discussed possibly building a ramp but daughter does not feel that there is sufficient room.  Check CBC CMP and TSH

## 2022-02-25 LAB — COMPLETE METABOLIC PANEL WITH GFR
AG Ratio: 1.7 (calc) (ref 1.0–2.5)
ALT: 10 U/L (ref 6–29)
AST: 18 U/L (ref 10–35)
Albumin: 4.2 g/dL (ref 3.6–5.1)
Alkaline phosphatase (APISO): 72 U/L (ref 37–153)
BUN/Creatinine Ratio: 12 (calc) (ref 6–22)
BUN: 12 mg/dL (ref 7–25)
CO2: 28 mmol/L (ref 20–32)
Calcium: 9.7 mg/dL (ref 8.6–10.4)
Chloride: 104 mmol/L (ref 98–110)
Creat: 1.02 mg/dL — ABNORMAL HIGH (ref 0.60–0.95)
Globulin: 2.5 g/dL (calc) (ref 1.9–3.7)
Glucose, Bld: 133 mg/dL — ABNORMAL HIGH (ref 65–99)
Potassium: 4.2 mmol/L (ref 3.5–5.3)
Sodium: 142 mmol/L (ref 135–146)
Total Bilirubin: 0.4 mg/dL (ref 0.2–1.2)
Total Protein: 6.7 g/dL (ref 6.1–8.1)
eGFR: 51 mL/min/{1.73_m2} — ABNORMAL LOW (ref >=60)

## 2022-02-25 LAB — CBC WITH DIFFERENTIAL/PLATELET
Absolute Monocytes: 592 cells/uL (ref 200–950)
Basophils Absolute: 13 cells/uL (ref 0–200)
Basophils Relative: 0.2 %
Eosinophils Absolute: 221 cells/uL (ref 15–500)
Eosinophils Relative: 3.5 %
HCT: 40.6 % (ref 35.0–45.0)
Hemoglobin: 13.5 g/dL (ref 11.7–15.5)
Lymphs Abs: 2085 cells/uL (ref 850–3900)
MCH: 30.8 pg (ref 27.0–33.0)
MCHC: 33.3 g/dL (ref 32.0–36.0)
MCV: 92.7 fL (ref 80.0–100.0)
MPV: 10 fL (ref 7.5–12.5)
Monocytes Relative: 9.4 %
Neutro Abs: 3389 cells/uL (ref 1500–7800)
Neutrophils Relative %: 53.8 %
Platelets: 328 10*3/uL (ref 140–400)
RBC: 4.38 10*6/uL (ref 3.80–5.10)
RDW: 12.5 % (ref 11.0–15.0)
Total Lymphocyte: 33.1 %
WBC: 6.3 10*3/uL (ref 3.8–10.8)

## 2022-02-25 LAB — TSH: TSH: 1.63 mIU/L (ref 0.40–4.50)

## 2022-04-18 ENCOUNTER — Encounter: Payer: Self-pay | Admitting: Family Medicine

## 2022-04-25 DIAGNOSIS — H40013 Open angle with borderline findings, low risk, bilateral: Secondary | ICD-10-CM | POA: Diagnosis not present

## 2022-05-20 ENCOUNTER — Other Ambulatory Visit: Payer: Self-pay | Admitting: Family Medicine

## 2022-08-18 ENCOUNTER — Telehealth: Payer: Self-pay

## 2022-08-18 ENCOUNTER — Other Ambulatory Visit: Payer: Self-pay | Admitting: Family Medicine

## 2022-08-18 NOTE — Telephone Encounter (Signed)
Pt's daughter called in to ask if nurse would please give a cb. Pt's has been having some high bp readings. Pt's daughter wanted to know if pt needs to be seen. Please advise  Cb#: [phone]

## 2022-08-18 NOTE — Telephone Encounter (Signed)
OV 02/24/22 Requested Prescriptions  Pending Prescriptions Disp Refills   levothyroxine (SYNTHROID) 25 MCG tablet [Pharmacy Med Name: levothyroxine 25 mcg tablet] 90 tablet 2    Sig: TAKE 1 TABLET BY MOUTH EVERY DAY BEFORE breakfast, take in addition TO 50 mcg TO equal     Endocrinology:  Hypothyroid Agents Failed - 08/18/2022  8:04 AM      Failed - Valid encounter within last 12 months    Recent Outpatient Visits           1 year ago Leg swelling   [facility] Tanya Nones, Priscille Heidelberg, MD   1 year ago Benign neoplasm of right clavicle   [facility] Donita Brooks, MD   2 years ago Coronary artery disease involving native heart, unspecified vessel or lesion type, unspecified whether angina present   [facility] Donita Brooks, MD   2 years ago Benign essential hypertension   [facility] [place], Velna Hatchet, MD   2 years ago Benign essential hypertension   [facility] Medicine [place], Velna Hatchet, MD              Passed - TSH in normal range and within 360 days    TSH  Date Value Ref Range Status  02/24/2022 1.63 0.40 - 4.50 mIU/L Final          metoprolol tartrate (LOPRESSOR) 50 MG tablet [Pharmacy Med Name: metoprolol tartrate 50 mg tablet] 180 tablet 1    Sig: TAKE 1 TABLET BY MOUTH TWICE DAILY FOR BLOOD PRESSURE     Cardiovascular:  Beta Blockers Failed - 08/18/2022  8:04 AM      Failed - Valid encounter within last 6 months    Recent Outpatient Visits           1 year ago Leg swelling   [facility] Donita Brooks, MD   1 year ago Benign neoplasm of right clavicle   [facility] Donita Brooks, MD   2 years ago Coronary artery disease involving native heart, unspecified vessel or lesion type, unspecified whether angina present   [facility] Donita Brooks, MD   2 years ago Benign essential hypertension   [facility] Medicine  [place], Velna Hatchet, MD   2 years ago Benign essential hypertension   [facility] Medicine [place], Velna Hatchet, MD              Passed - Last BP in normal range    BP Readings from Last 1 Encounters:  02/24/22 118/62         Passed - Last Heart Rate in normal range    Pulse Readings from Last 1 Encounters:  02/24/22 62          simvastatin (ZOCOR) 40 MG tablet [Pharmacy Med Name: simvastatin 40 mg tablet] 90 tablet 1    Sig: TAKE 1 TABLET BY MOUTH AT BEDTIME     Cardiovascular:  Antilipid - Statins Failed - 08/18/2022  8:04 AM      Failed - Valid encounter within last 12 months    Recent Outpatient Visits           1 year ago Leg swelling   [facility] Donita Brooks, MD   1 year ago Benign neoplasm of right clavicle   [facility] Donita Brooks, MD   2 years ago Coronary artery disease  involving native heart, unspecified vessel or lesion type, unspecified whether angina present   [facility] Tanya Nones, Priscille Heidelberg, MD   2 years ago Benign essential hypertension   [facility] Medicine [place], Velna Hatchet, MD   2 years ago Benign essential hypertension   [facility] Medicine [place], Velna Hatchet, MD              Failed - Lipid Panel in normal range within the last 12 months    Cholesterol  Date Value Ref Range Status  07/23/2020 174 <200 mg/dL Final   LDL Cholesterol (Calc)  Date Value Ref Range Status  07/23/2020 83 mg/dL (calc) Final    Comment:    Reference range: <100 . Desirable range <100 mg/dL for primary prevention;   <70 mg/dL for patients with CHD or diabetic patients  with > or = 2 CHD risk factors. Marland Kitchen LDL-C is now calculated using the Martin-Hopkins  calculation, which is a validated novel method providing  better accuracy than the Friedewald equation in the  estimation of LDL-C.  Horald Pollen et al. Lenox Ahr. 1610;960(45): 2061-2068   (http://education.[facility].com/faq/FAQ164)    HDL  Date Value Ref Range Status  07/23/2020 73 > OR = 50 mg/dL Final   Triglycerides  Date Value Ref Range Status  07/23/2020 89 <150 mg/dL Final         Passed - Patient is not pregnant       cetirizine (ZYRTEC) 10 MG tablet [Pharmacy Med Name: cetirizine 10 mg tablet] 90 tablet 2    Sig: TAKE 1 TABLET BY MOUTH EVERY DAY     Ear, Nose, and Throat:  Antihistamines 2 Failed - 08/18/2022  8:04 AM      Failed - Cr in normal range and within 360 days    Creat  Date Value Ref Range Status  02/24/2022 1.02 (H) 0.60 - 0.95 mg/dL Final         Failed - Valid encounter within last 12 months    Recent Outpatient Visits           1 year ago Leg swelling   [facility] Donita Brooks, MD   1 year ago Benign neoplasm of right clavicle   [facility] Donita Brooks, MD   2 years ago Coronary artery disease involving native heart, unspecified vessel or lesion type, unspecified whether angina present   [facility] Donita Brooks, MD   2 years ago Benign essential hypertension   [facility] Medicine [place], Velna Hatchet, MD   2 years ago Benign essential hypertension   [facility] Medicine [place], Velna Hatchet, MD               escitalopram (LEXAPRO) 10 MG tablet [Pharmacy Med Name: escitalopram 10 mg tablet] 90 tablet 1    Sig: TAKE 1 TABLET BY MOUTH EVERY DAY     Psychiatry:  Antidepressants - SSRI Failed - 08/18/2022  8:04 AM      Failed - Valid encounter within last 6 months    Recent Outpatient Visits           1 year ago Leg swelling   [facility] Donita Brooks, MD   1 year ago Benign neoplasm of right clavicle   [facility] Donita Brooks, MD   2 years ago Coronary artery disease involving native heart, unspecified vessel or lesion type, unspecified whether angina present  [facility]  [place], Priscille Heidelberg, MD   2 years ago Benign essential hypertension   [facility] Medicine [place], Velna Hatchet, MD   2 years ago Benign essential hypertension   [facility] Medicine [place], Velna Hatchet, MD              Passed - Completed PHQ-2 or PHQ-9 in the last 360 days       levothyroxine (SYNTHROID) 50 MCG tablet [Pharmacy Med Name: levothyroxine 50 mcg tablet] 90 tablet 1    Sig: TAKE 1 TABLET BY MOUTH DAILY     Endocrinology:  Hypothyroid Agents Failed - 08/18/2022  8:04 AM      Failed - Valid encounter within last 12 months    Recent Outpatient Visits           1 year ago Leg swelling   [facility] Donita Brooks, MD   1 year ago Benign neoplasm of right clavicle   [facility] Donita Brooks, MD   2 years ago Coronary artery disease involving native heart, unspecified vessel or lesion type, unspecified whether angina present   [facility] Donita Brooks, MD   2 years ago Benign essential hypertension   [facility] Medicine [place], Velna Hatchet, MD   2 years ago Benign essential hypertension   [facility] Medicine [place], Velna Hatchet, MD              Passed - TSH in normal range and within 360 days    TSH  Date Value Ref Range Status  02/24/2022 1.63 0.40 - 4.50 mIU/L Final

## 2022-08-18 NOTE — Telephone Encounter (Signed)
Requested medication (s) are due for refill today - yes  Requested medication (s) are on the active medication list -yes  Future visit scheduled -no  Last refill: 02/21/22 #90 1RF  Notes to clinic: fails lab protocol- over 1 year-07/23/20  Requested Prescriptions  Pending Prescriptions Disp Refills   simvastatin (ZOCOR) 40 MG tablet [Pharmacy Med Name: simvastatin 40 mg tablet] 90 tablet 1    Sig: TAKE 1 TABLET BY MOUTH AT BEDTIME     Cardiovascular:  Antilipid - Statins Failed - 08/18/2022  8:04 AM      Failed - Valid encounter within last 12 months    Recent Outpatient Visits           1 year ago Leg swelling   [facility] Donita Brooks, MD   1 year ago Benign neoplasm of right clavicle   [facility] Donita Brooks, MD   2 years ago Coronary artery disease involving native heart, unspecified vessel or lesion type, unspecified whether angina present   [facility] Donita Brooks, MD   2 years ago Benign essential hypertension   [facility] Medicine [place], Velna Hatchet, MD   2 years ago Benign essential hypertension   [facility] Medicine [place], Velna Hatchet, MD              Failed - Lipid Panel in normal range within the last 12 months    Cholesterol  Date Value Ref Range Status  07/23/2020 174 <200 mg/dL Final   LDL Cholesterol (Calc)  Date Value Ref Range Status  07/23/2020 83 mg/dL (calc) Final    Comment:    Reference range: <100 . Desirable range <100 mg/dL for primary prevention;   <70 mg/dL for patients with CHD or diabetic patients  with > or = 2 CHD risk factors. Marland Kitchen LDL-C is now calculated using the Martin-Hopkins  calculation, which is a validated novel method providing  better accuracy than the Friedewald equation in the  estimation of LDL-C.  Horald Pollen et al. Lenox Ahr. 3086;578(46): 2061-2068  (http://education.[facility].com/faq/FAQ164)    HDL  Date Value Ref Range Status   07/23/2020 73 > OR = 50 mg/dL Final   Triglycerides  Date Value Ref Range Status  07/23/2020 89 <150 mg/dL Final         Passed - Patient is not pregnant      Signed Prescriptions Disp Refills   levothyroxine (SYNTHROID) 25 MCG tablet 90 tablet 1    Sig: TAKE 1 TABLET BY MOUTH EVERY DAY BEFORE breakfast, take in addition TO 50 mcg TO equal     Endocrinology:  Hypothyroid Agents Failed - 08/18/2022  8:04 AM      Failed - Valid encounter within last 12 months    Recent Outpatient Visits           1 year ago Leg swelling   [facility] Donita Brooks, MD   1 year ago Benign neoplasm of right clavicle   [facility] Donita Brooks, MD   2 years ago Coronary artery disease involving native heart, unspecified vessel or lesion type, unspecified whether angina present   [facility] Donita Brooks, MD   2 years ago Benign essential hypertension   [facility] Medicine [place], Velna Hatchet, MD   2 years ago Benign essential hypertension   [facility] Medicine [place], Velna Hatchet, MD  Passed - TSH in normal range and within 360 days    TSH  Date Value Ref Range Status  02/24/2022 1.63 0.40 - 4.50 mIU/L Final          metoprolol tartrate (LOPRESSOR) 50 MG tablet 180 tablet 1    Sig: TAKE 1 TABLET BY MOUTH TWICE DAILY FOR BLOOD PRESSURE     Cardiovascular:  Beta Blockers Failed - 08/18/2022  8:04 AM      Failed - Valid encounter within last 6 months    Recent Outpatient Visits           1 year ago Leg swelling   [facility] Donita Brooks, MD   1 year ago Benign neoplasm of right clavicle   [facility] Donita Brooks, MD   2 years ago Coronary artery disease involving native heart, unspecified vessel or lesion type, unspecified whether angina present   [facility] Donita Brooks, MD   2 years ago Benign essential hypertension    [facility] Medicine [place], Velna Hatchet, MD   2 years ago Benign essential hypertension   [facility] Medicine [place], Velna Hatchet, MD              Passed - Last BP in normal range    BP Readings from Last 1 Encounters:  02/24/22 118/62         Passed - Last Heart Rate in normal range    Pulse Readings from Last 1 Encounters:  02/24/22 62          cetirizine (ZYRTEC) 10 MG tablet 90 tablet 1    Sig: TAKE 1 TABLET BY MOUTH EVERY DAY     Ear, Nose, and Throat:  Antihistamines 2 Failed - 08/18/2022  8:04 AM      Failed - Cr in normal range and within 360 days    Creat  Date Value Ref Range Status  02/24/2022 1.02 (H) 0.60 - 0.95 mg/dL Final         Failed - Valid encounter within last 12 months    Recent Outpatient Visits           1 year ago Leg swelling   [facility] Donita Brooks, MD   1 year ago Benign neoplasm of right clavicle   [facility] Donita Brooks, MD   2 years ago Coronary artery disease involving native heart, unspecified vessel or lesion type, unspecified whether angina present   [facility] Donita Brooks, MD   2 years ago Benign essential hypertension   [facility] Medicine [place], Velna Hatchet, MD   2 years ago Benign essential hypertension   [facility] Medicine [place], Velna Hatchet, MD               escitalopram (LEXAPRO) 10 MG tablet 90 tablet 1    Sig: TAKE 1 TABLET BY MOUTH EVERY DAY     Psychiatry:  Antidepressants - SSRI Failed - 08/18/2022  8:04 AM      Failed - Valid encounter within last 6 months    Recent Outpatient Visits           1 year ago Leg swelling   [facility] Medicine [place], Priscille Heidelberg, MD   1 year ago Benign neoplasm of right clavicle   [facility] Donita Brooks, MD   2 years ago Coronary artery disease involving native heart,  unspecified vessel or lesion type, unspecified whether angina present   [facility] Medicine Tanya Nones, Priscille Heidelberg, MD   2 years ago Benign essential hypertension   [facility] Medicine [place], Velna Hatchet, MD   2 years ago Benign essential hypertension   [facility] Medicine [place], Velna Hatchet, MD              Passed - Completed PHQ-2 or PHQ-9 in the last 360 days       levothyroxine (SYNTHROID) 50 MCG tablet 90 tablet 1    Sig: TAKE 1 TABLET BY MOUTH DAILY     Endocrinology:  Hypothyroid Agents Failed - 08/18/2022  8:04 AM      Failed - Valid encounter within last 12 months    Recent Outpatient Visits           1 year ago Leg swelling   [facility] Tanya Nones, Priscille Heidelberg, MD   1 year ago Benign neoplasm of right clavicle   [facility] Donita Brooks, MD   2 years ago Coronary artery disease involving native heart, unspecified vessel or lesion type, unspecified whether angina present   [facility] Donita Brooks, MD   2 years ago Benign essential hypertension   [facility] Medicine [place], Velna Hatchet, MD   2 years ago Benign essential hypertension   [facility] Medicine [place], Velna Hatchet, MD              Passed - TSH in normal range and within 360 days    TSH  Date Value Ref Range Status  02/24/2022 1.63 0.40 - 4.50 mIU/L Final            Requested Prescriptions  Pending Prescriptions Disp Refills   simvastatin (ZOCOR) 40 MG tablet [Pharmacy Med Name: simvastatin 40 mg tablet] 90 tablet 1    Sig: TAKE 1 TABLET BY MOUTH AT BEDTIME     Cardiovascular:  Antilipid - Statins Failed - 08/18/2022  8:04 AM      Failed - Valid encounter within last 12 months    Recent Outpatient Visits           1 year ago Leg swelling   [facility] Donita Brooks, MD   1 year ago Benign neoplasm of right clavicle   [facility] Donita Brooks, MD   2 years ago Coronary artery disease involving native heart, unspecified vessel or lesion  type, unspecified whether angina present   [facility] Donita Brooks, MD   2 years ago Benign essential hypertension   [facility] Medicine [place], Velna Hatchet, MD   2 years ago Benign essential hypertension   [facility] Medicine [place], Velna Hatchet, MD              Failed - Lipid Panel in normal range within the last 12 months    Cholesterol  Date Value Ref Range Status  07/23/2020 174 <200 mg/dL Final   LDL Cholesterol (Calc)  Date Value Ref Range Status  07/23/2020 83 mg/dL (calc) Final    Comment:    Reference range: <100 . Desirable range <100 mg/dL for primary prevention;   <70 mg/dL for patients with CHD or diabetic patients  with > or = 2 CHD risk factors. Marland Kitchen LDL-C is now calculated using the Martin-Hopkins  calculation, which is a validated novel method providing  better accuracy than the [employer]  equation in the  estimation of LDL-C.  Horald Pollen et al. Lenox Ahr. 2725;366(44): 2061-2068  (http://education.[facility].com/faq/FAQ164)    HDL  Date Value Ref Range Status  07/23/2020 73 > OR = 50 mg/dL Final   Triglycerides  Date Value Ref Range Status  07/23/2020 89 <150 mg/dL Final         Passed - Patient is not pregnant      Signed Prescriptions Disp Refills   levothyroxine (SYNTHROID) 25 MCG tablet 90 tablet 1    Sig: TAKE 1 TABLET BY MOUTH EVERY DAY BEFORE breakfast, take in addition TO 50 mcg TO equal     Endocrinology:  Hypothyroid Agents Failed - 08/18/2022  8:04 AM      Failed - Valid encounter within last 12 months    Recent Outpatient Visits           1 year ago Leg swelling   [facility] Tanya Nones, Priscille Heidelberg, MD   1 year ago Benign neoplasm of right clavicle   [facility] Donita Brooks, MD   2 years ago Coronary artery disease involving native heart, unspecified vessel or lesion type, unspecified whether angina present   [facility] Donita Brooks, MD   2 years ago Benign essential hypertension   [facility] Medicine [place], Velna Hatchet, MD   2 years ago Benign essential hypertension   [facility] Medicine [place], Velna Hatchet, MD              Passed - TSH in normal range and within 360 days    TSH  Date Value Ref Range Status  02/24/2022 1.63 0.40 - 4.50 mIU/L Final          metoprolol tartrate (LOPRESSOR) 50 MG tablet 180 tablet 1    Sig: TAKE 1 TABLET BY MOUTH TWICE DAILY FOR BLOOD PRESSURE     Cardiovascular:  Beta Blockers Failed - 08/18/2022  8:04 AM      Failed - Valid encounter within last 6 months    Recent Outpatient Visits           1 year ago Leg swelling   [facility] Donita Brooks, MD   1 year ago Benign neoplasm of right clavicle   [facility] Donita Brooks, MD   2 years ago Coronary artery disease involving native heart, unspecified vessel or lesion type, unspecified whether angina present   [facility] Donita Brooks, MD   2 years ago Benign essential hypertension   [facility] Medicine [place], Velna Hatchet, MD   2 years ago Benign essential hypertension   [facility] Medicine [place], Velna Hatchet, MD              Passed - Last BP in normal range    BP Readings from Last 1 Encounters:  02/24/22 118/62         Passed - Last Heart Rate in normal range    Pulse Readings from Last 1 Encounters:  02/24/22 62          cetirizine (ZYRTEC) 10 MG tablet 90 tablet 1    Sig: TAKE 1 TABLET BY MOUTH EVERY DAY     Ear, Nose, and Throat:  Antihistamines 2 Failed - 08/18/2022  8:04 AM      Failed - Cr in normal range and within 360 days    Creat  Date Value Ref Range Status  02/24/2022  1.02 (H) 0.60 - 0.95 mg/dL Final         Failed - Valid encounter within last 12 months    Recent Outpatient Visits           1 year ago Leg swelling   [facility] Medicine [place], Priscille Heidelberg, MD   1 year ago  Benign neoplasm of right clavicle   [facility] Donita Brooks, MD   2 years ago Coronary artery disease involving native heart, unspecified vessel or lesion type, unspecified whether angina present   [facility] Donita Brooks, MD   2 years ago Benign essential hypertension   [facility] Medicine [place], Velna Hatchet, MD   2 years ago Benign essential hypertension   [facility] Medicine [place], Velna Hatchet, MD               escitalopram (LEXAPRO) 10 MG tablet 90 tablet 1    Sig: TAKE 1 TABLET BY MOUTH EVERY DAY     Psychiatry:  Antidepressants - SSRI Failed - 08/18/2022  8:04 AM      Failed - Valid encounter within last 6 months    Recent Outpatient Visits           1 year ago Leg swelling   [facility] Medicine Tanya Nones, Priscille Heidelberg, MD   1 year ago Benign neoplasm of right clavicle   [facility] Medicine Donita Brooks, MD   2 years ago Coronary artery disease involving native heart, unspecified vessel or lesion type, unspecified whether angina present   [facility] Donita Brooks, MD   2 years ago Benign essential hypertension   [facility] Medicine [place], Velna Hatchet, MD   2 years ago Benign essential hypertension   [facility] Medicine [place], Velna Hatchet, MD              Passed - Completed PHQ-2 or PHQ-9 in the last 360 days       levothyroxine (SYNTHROID) 50 MCG tablet 90 tablet 1    Sig: TAKE 1 TABLET BY MOUTH DAILY     Endocrinology:  Hypothyroid Agents Failed - 08/18/2022  8:04 AM      Failed - Valid encounter within last 12 months    Recent Outpatient Visits           1 year ago Leg swelling   [facility] Medicine Tanya Nones, Priscille Heidelberg, MD   1 year ago Benign neoplasm of right clavicle   [facility] Medicine Donita Brooks, MD   2 years ago Coronary artery disease involving native heart, unspecified vessel or lesion type, unspecified whether  angina present   [facility] Medicine Donita Brooks, MD   2 years ago Benign essential hypertension   [facility] Medicine [place], Velna Hatchet, MD   2 years ago Benign essential hypertension   [facility] Medicine [place], Velna Hatchet, MD              Passed - TSH in normal range and within 360 days    TSH  Date Value Ref Range Status  02/24/2022 1.63 0.40 - 4.50 mIU/L Final

## 2022-09-01 ENCOUNTER — Telehealth: Payer: Self-pay

## 2022-09-01 NOTE — Telephone Encounter (Signed)
Pt's daughter called in to let pcp/nurse know that pt's bp was 171/72 today. Please advise.  Cb#: [phone]

## 2022-09-02 ENCOUNTER — Other Ambulatory Visit: Payer: Self-pay

## 2022-09-02 ENCOUNTER — Telehealth: Payer: Self-pay

## 2022-09-02 MED ORDER — AMLODIPINE BESYLATE 10 MG PO TABS: 10.0000 mg | ORAL_TABLET | Freq: Every day | ORAL | 3 refills | Status: DC

## 2022-09-02 NOTE — Telephone Encounter (Signed)
[facility] called in about a prescription sent in for pt that may be a contraindication. Please call [facility] to clarify prescription.  CB#: [phone]

## 2022-09-02 NOTE — Telephone Encounter (Signed)
Returned call pharmacist re: amlodipine.  Spoke w/Summer,pharmacisit: per pcp,"stop the simvastatin. Filled the amlodipine as requested.    Also, called and spoke w/pt's daughter-Toni. Advise her that per pcp for pt to "stop simvastatin" and only take the amlodipine.  Daughter aware.

## 2022-09-02 NOTE — Telephone Encounter (Signed)
Spoke w/pt's daughter and give her pcp's recommendations,"If BP is consistently > 140/90 would add amlodipine 10 mg poqday "  Per pt's daughter, voiced understanding. Rx send to pt's pharmacy listed in chart.

## 2022-09-02 NOTE — Telephone Encounter (Addendum)
Spoke w/Pharmacists at [facility], stated there is a drug contraindication w/amlodipine since pt is also on Simvastatin at 40mg .  Per pharmacists stated with the high high dose of simvastatin along w/amlodipine it can cause severe side effects.   Pls advice?

## 2022-11-08 ENCOUNTER — Encounter: Payer: Self-pay | Admitting: Family Medicine

## 2022-11-08 ENCOUNTER — Ambulatory Visit (INDEPENDENT_AMBULATORY_CARE_PROVIDER_SITE_OTHER): Payer: Medicare Other | Admitting: Family Medicine

## 2022-11-08 VITALS — BP 112/68 | HR 75 | Temp 99.0°F | Ht 64.0 in | Wt 153.6 lb

## 2022-11-08 DIAGNOSIS — E039 Hypothyroidism, unspecified: Secondary | ICD-10-CM | POA: Diagnosis not present

## 2022-11-08 DIAGNOSIS — I739 Peripheral vascular disease, unspecified: Secondary | ICD-10-CM | POA: Diagnosis not present

## 2022-11-08 MED ORDER — GABAPENTIN 100 MG PO CAPS
100.0000 mg | ORAL_CAPSULE | Freq: Three times a day (TID) | ORAL | 3 refills | Status: DC | PRN
Start: 2022-11-08 — End: 2023-10-24

## 2022-11-08 MED ORDER — PANTOPRAZOLE SODIUM 40 MG PO TBEC: 40.0000 mg | DELAYED_RELEASE_TABLET | Freq: Every day | ORAL | 3 refills | Status: DC

## 2022-11-08 NOTE — Progress Notes (Signed)
Subjective:    Patient ID: Patricia Morton, female    DOB: [birth year], 89 y.o.   MRN: [id number]  HPI Patient is here today with her daughter.  Patient reports burning pain in her right leg greater than her left leg.  She states that her leg aches and throbs and burns.  This occurs with walking and with sitting.  She describes tingling and throbbing in her shin and her calf and her foot.  This occurs bilaterally.  She does have varicose veins in both legs with minimal swelling.  She also has diminished pulses at the dorsalis pedis and posterior tibialis especially in the right foot.  She has delayed capillary refill in the right foot distal to the MTP joints.  However the pain sounds neuropathic. Past Medical History:  Diagnosis Date   Anxiety    Arthritis    Cataract    surgically removed   Hyperlipemia    Hypertension    Hypothyroid    Urticaria    Past Surgical History:  Procedure Laterality Date   ABDOMINAL HYSTERECTOMY     BREAST SURGERY     right breast -lumpectomy-benign   CYSTECTOMY     EYE SURGERY     HAND SURGERY Right 02/2019   TOTAL KNEE ARTHROPLASTY Right 05/25/2015   Procedure: TOTAL KNEE ARTHROPLASTY;  Surgeon: Ollen Gross, MD;  Location: [facility];  Service: Orthopedics;  Laterality: Right;   TOTAL KNEE ARTHROPLASTY Left 12/14/2015   Procedure: LEFT TOTAL KNEE ARTHROPLASTY;  Surgeon: Ollen Gross, MD;  Location: [facility];  Service: Orthopedics;  Laterality: Left;   Current Outpatient Medications on File Prior to Visit  Medication Sig Dispense Refill   amLODipine (NORVASC) 10 MG tablet Take 1 tablet (10 mg total) by mouth daily. 90 tablet 3   calcium carbonate (OSCAL) 1500 (600 Ca) MG TABS tablet Take by mouth 2 (two) times daily with a meal.     cetirizine (ZYRTEC) 10 MG tablet TAKE 1 TABLET BY MOUTH EVERY DAY 90 tablet 1   EPINEPHrine 0.3 mg/0.3 mL IJ SOAJ injection Inject 0.3 mLs (0.3 mg total) into the muscle as needed for anaphylaxis. 1 Device 1   escitalopram  (LEXAPRO) 10 MG tablet TAKE 1 TABLET BY MOUTH EVERY DAY 90 tablet 1   furosemide (LASIX) 40 MG tablet TAKE 1 TABLET BY MOUTH DAILY 90 tablet 1   levothyroxine (SYNTHROID) 25 MCG tablet TAKE 1 TABLET BY MOUTH EVERY DAY BEFORE breakfast, take in addition TO 50 mcg TO equal 90 tablet 1   levothyroxine (SYNTHROID) 50 MCG tablet TAKE 1 TABLET BY MOUTH DAILY 90 tablet 1   metoprolol tartrate (LOPRESSOR) 50 MG tablet TAKE 1 TABLET BY MOUTH TWICE DAILY FOR BLOOD PRESSURE 180 tablet 1   neomycin-bacitracin-polymyxin (NEOSPORIN) ophthalmic ointment Place 1 Application into the right eye 4 (four) times daily. 3.5 g 0   pantoprazole (PROTONIX) 40 MG tablet TAKE 1 TABLET BY MOUTH DAILY 90 tablet 1   simvastatin (ZOCOR) 40 MG tablet TAKE 1 TABLET BY MOUTH AT BEDTIME 90 tablet 1   Current Facility-Administered Medications on File Prior to Visit  Medication Dose Route Frequency Provider Last Rate Last Admin   omalizumab Geoffry Paradise) injection 150 mg  150 mg Subcutaneous Q28 days Ellamae Sia, DO   150 mg at 09/09/20 1019   Allergies  Allergen Reactions   Celexa [Citalopram Hydrobromide] Hives   Social History   Socioeconomic History   Marital status: Widowed    Spouse name: Not on file  Number of children: 2   Years of education: Not on file   Highest education level: Not on file  Occupational History   Not on file  Tobacco Use   Smoking status: Former    Current packs/day: 0.00    Types: Cigarettes    Start date: 71    Quit date: 2004    Years since quitting: 20.6   Smokeless tobacco: Never  Vaping Use   Vaping status: Never Used  Substance and Sexual Activity   Alcohol use: Not Currently    Comment: wine occassionally   Drug use: No   Sexual activity: Not Currently  Other Topics Concern   Not on file  Social History Narrative   Daughter is primary caregiver    Worked at [employer] as Conservation officer, nature    Social Determinants of Health   Financial Resource Strain: Low Risk   (02/15/2022)   Overall Financial Resource Strain (CARDIA)    Difficulty of Paying Living Expenses: Not hard at all  Food Insecurity: No Food Insecurity (02/15/2022)   Hunger Vital Sign    Worried About Running Out of Food in the Last Year: Never true    Ran Out of Food in the Last Year: Never true  Transportation Needs: No Transportation Needs (02/15/2022)   PRAPARE - Administrator, Civil Service (Medical): No    Lack of Transportation (Non-Medical): No  Physical Activity: Unknown (02/15/2022)   Exercise Vital Sign    Days of Exercise per Week: Not on file    Minutes of Exercise per Session: 0 min  Stress: No Stress Concern Present (02/15/2022)   [employer] of Occupational Health - Occupational Stress Questionnaire    Feeling of Stress : Not at all  Social Connections: Moderately Integrated (02/15/2022)   Social Connection and Isolation Panel [NHANES]    Frequency of Communication with Friends and Family: More than three times a week    Frequency of Social Gatherings with Friends and Family: Three times a week    Attends Religious Services: More than 4 times per year    Active Member of Clubs or Organizations: Yes    Attends Banker Meetings: More than 4 times per year    Marital Status: Widowed  Intimate Partner Violence: Not At Risk (02/15/2022)   Humiliation, Afraid, Rape, and Kick questionnaire    Fear of Current or Ex-Partner: No    Emotionally Abused: No    Physically Abused: No    Sexually Abused: No     Review of Systems  All other systems reviewed and are negative.      Objective:   Physical Exam Vitals reviewed.  Constitutional:      Appearance: Normal appearance.  Cardiovascular:     Rate and Rhythm: Normal rate and regular rhythm.     Heart sounds: Normal heart sounds. No murmur heard.    No friction rub. No gallop.  Pulmonary:     Effort: Pulmonary effort is normal. No respiratory distress.     Breath sounds: Normal breath  sounds. No wheezing, rhonchi or rales.  Abdominal:     General: Bowel sounds are normal. There is no distension.     Palpations: Abdomen is soft.     Tenderness: There is no abdominal tenderness. There is no guarding or rebound.  Musculoskeletal:     Right lower leg: No edema.     Left lower leg: No edema.  Neurological:     Mental Status: She is alert.  Assessment & Plan:  Intermittent claudication (HCC) - Plan: VAS [place] ABI WITH/WO TBI  Hypothyroidism, unspecified type - Plan: CBC with Differential/Platelet, COMPLETE METABOLIC PANEL WITH GFR, TSH Patient does have some pain with ambulation and she does have diminished pedal pulses.  Therefore we will get a vascular ultrasound to rule out peripheral vascular disease.  However her pain is due to peripheral neuropathy.  We discussed the risk of gabapentin but I recommended she try for pain 100 mg every 8 hours as needed.  I cautioned the patient about dizziness and sleepiness.  While the patient is here today I will check a CBC a CMP and a TSH.

## 2022-11-09 LAB — COMPLETE METABOLIC PANEL WITH GFR
AG Ratio: 1.5 (calc) (ref 1.0–2.5)
ALT: 13 U/L (ref 6–29)
AST: 20 U/L (ref 10–35)
Albumin: 4.2 g/dL (ref 3.6–5.1)
Alkaline phosphatase (APISO): 75 U/L (ref 37–153)
BUN: 9 mg/dL (ref 7–25)
CO2: 27 mmol/L (ref 20–32)
Calcium: 9.4 mg/dL (ref 8.6–10.4)
Chloride: 100 mmol/L (ref 98–110)
Creat: 0.92 mg/dL (ref 0.60–0.95)
Globulin: 2.8 g/dL (ref 1.9–3.7)
Glucose, Bld: 69 mg/dL (ref 65–99)
Potassium: 3.9 mmol/L (ref 3.5–5.3)
Sodium: 140 mmol/L (ref 135–146)
Total Bilirubin: 0.5 mg/dL (ref 0.2–1.2)
Total Protein: 7 g/dL (ref 6.1–8.1)
eGFR: 58 mL/min/{1.73_m2} — ABNORMAL LOW (ref >=60)

## 2022-11-09 LAB — CBC WITH DIFFERENTIAL/PLATELET
Absolute Monocytes: 526 {cells}/uL (ref 200–950)
Basophils Absolute: 7 {cells}/uL (ref 0–200)
Basophils Relative: 0.1 %
Eosinophils Absolute: 256 {cells}/uL (ref 15–500)
Eosinophils Relative: 3.5 %
HCT: 42.5 % (ref 35.0–45.0)
Hemoglobin: 14 g/dL (ref 11.7–15.5)
Lymphs Abs: 2008 {cells}/uL (ref 850–3900)
MCH: 30.5 pg (ref 27.0–33.0)
MCHC: 32.9 g/dL (ref 32.0–36.0)
MCV: 92.6 fL (ref 80.0–100.0)
MPV: 9.5 fL (ref 7.5–12.5)
Monocytes Relative: 7.2 %
Neutro Abs: 4504 {cells}/uL (ref 1500–7800)
Neutrophils Relative %: 61.7 %
Platelets: 479 10*3/uL — ABNORMAL HIGH (ref 140–400)
RBC: 4.59 10*6/uL (ref 3.80–5.10)
RDW: 13.1 % (ref 11.0–15.0)
Total Lymphocyte: 27.5 %
WBC: 7.3 10*3/uL (ref 3.8–10.8)

## 2022-11-09 LAB — TSH: TSH: 4.24 m[IU]/L (ref 0.40–4.50)

## 2022-11-13 ENCOUNTER — Other Ambulatory Visit: Payer: Self-pay | Admitting: Family Medicine

## 2022-11-15 ENCOUNTER — Ambulatory Visit (HOSPITAL_COMMUNITY)
Admission: RE | Admit: 2022-11-15 | Discharge: 2022-11-15 | Disposition: A | Discharge status: Home / Self Care | Payer: Medicare Other | Source: Ambulatory Visit | Attending: Family Medicine | Admitting: Family Medicine

## 2022-11-15 DIAGNOSIS — I739 Peripheral vascular disease, unspecified: Secondary | ICD-10-CM | POA: Diagnosis not present

## 2022-11-15 LAB — VAS US ABI WITH/WO TBI
Left ABI: 1.21
Right ABI: 1.18

## 2022-11-15 NOTE — Telephone Encounter (Signed)
Requested Prescriptions  Pending Prescriptions Disp Refills   furosemide (LASIX) 40 MG tablet [Pharmacy Med Name: furosemide 40 mg tablet] 90 tablet 1    Sig: TAKE 1 TABLET BY MOUTH DAILY     Cardiovascular:  Diuretics - Loop Failed - 11/13/2022  8:01 AM      Failed - Mg Level in normal range and within 180 days    No results found for: "MG"       Failed - Valid encounter within last 6 months    Recent Outpatient Visits           1 year ago Leg swelling   [facility] Donita Brooks, MD   1 year ago Benign neoplasm of right clavicle   [facility] Donita Brooks, MD   2 years ago Coronary artery disease involving native heart, unspecified vessel or lesion type, unspecified whether angina present   [facility] Donita Brooks, MD   2 years ago Benign essential hypertension   [facility] [place], Velna Hatchet, MD   3 years ago Benign essential hypertension   [facility] [place], Velna Hatchet, MD              Passed - K in normal range and within 180 days    Potassium  Date Value Ref Range Status  11/08/2022 3.9 3.5 - 5.3 mmol/L Final         Passed - Ca in normal range and within 180 days    Calcium  Date Value Ref Range Status  11/08/2022 9.4 8.6 - 10.4 mg/dL Final         Passed - Na in normal range and within 180 days    Sodium  Date Value Ref Range Status  11/08/2022 140 135 - 146 mmol/L Final  04/20/2018 140 134 - 144 mmol/L Final         Passed - Cr in normal range and within 180 days    Creat  Date Value Ref Range Status  11/08/2022 0.92 0.60 - 0.95 mg/dL Final         Passed - Cl in normal range and within 180 days    Chloride  Date Value Ref Range Status  11/08/2022 100 98 - 110 mmol/L Final         Passed - Last BP in normal range    BP Readings from Last 1 Encounters:  11/08/22 112/68

## 2023-02-06 DIAGNOSIS — H40013 Open angle with borderline findings, low risk, bilateral: Secondary | ICD-10-CM | POA: Diagnosis not present

## 2023-02-12 ENCOUNTER — Other Ambulatory Visit: Payer: Self-pay | Admitting: Family Medicine

## 2023-02-15 DIAGNOSIS — Z1283 Encounter for screening for malignant neoplasm of skin: Secondary | ICD-10-CM | POA: Diagnosis not present

## 2023-02-15 DIAGNOSIS — L905 Scar conditions and fibrosis of skin: Secondary | ICD-10-CM | POA: Diagnosis not present

## 2023-02-15 DIAGNOSIS — L820 Inflamed seborrheic keratosis: Secondary | ICD-10-CM | POA: Diagnosis not present

## 2023-02-15 DIAGNOSIS — D225 Melanocytic nevi of trunk: Secondary | ICD-10-CM | POA: Diagnosis not present

## 2023-02-15 NOTE — Telephone Encounter (Signed)
Last OV 11/08/22 Requested Prescriptions  Pending Prescriptions Disp Refills   levothyroxine (SYNTHROID) 25 MCG tablet [Pharmacy Med Name: levothyroxine 25 mcg tablet] 90 tablet 0    Sig: TAKE 1 TABLET BY MOUTH EVERY DAY BEFORE breakfast, take in addition TO 50 mcg TO equal     Endocrinology:  Hypothyroid Agents Failed - 02/12/2023  8:01 AM      Failed - Valid encounter within last 12 months    Recent Outpatient Visits           1 year ago Leg swelling   [facility] Donita Brooks, MD   2 years ago Benign neoplasm of right clavicle   [facility] Donita Brooks, MD   2 years ago Coronary artery disease involving native heart, unspecified vessel or lesion type, unspecified whether angina present   [facility] Donita Brooks, MD   3 years ago Benign essential hypertension   [facility] [place], Velna Hatchet, MD   3 years ago Benign essential hypertension   [facility] [place], Velna Hatchet, MD              Passed - TSH in normal range and within 360 days    TSH  Date Value Ref Range Status  11/08/2022 4.24 0.40 - 4.50 mIU/L Final          cetirizine (ZYRTEC) 10 MG tablet [Pharmacy Med Name: cetirizine 10 mg tablet] 90 tablet 0    Sig: TAKE 1 TABLET BY MOUTH EVERY DAY     Ear, Nose, and Throat:  Antihistamines 2 Failed - 02/12/2023  8:01 AM      Failed - Valid encounter within last 12 months    Recent Outpatient Visits           1 year ago Leg swelling   [facility] Donita Brooks, MD   2 years ago Benign neoplasm of right clavicle   [facility] Donita Brooks, MD   2 years ago Coronary artery disease involving native heart, unspecified vessel or lesion type, unspecified whether angina present   [facility] Donita Brooks, MD   3 years ago Benign essential hypertension   [facility] [place], Velna Hatchet, MD    3 years ago Benign essential hypertension   [facility] Medicine [place], Velna Hatchet, MD              Passed - Cr in normal range and within 360 days    Creat  Date Value Ref Range Status  11/08/2022 0.92 0.60 - 0.95 mg/dL Final          levothyroxine (SYNTHROID) 50 MCG tablet [Pharmacy Med Name: levothyroxine 50 mcg tablet] 90 tablet 0    Sig: TAKE 1 TABLET BY MOUTH DAILY     Endocrinology:  Hypothyroid Agents Failed - 02/12/2023  8:01 AM      Failed - Valid encounter within last 12 months    Recent Outpatient Visits           1 year ago Leg swelling   [facility] Donita Brooks, MD   2 years ago Benign neoplasm of right clavicle   [facility] Donita Brooks, MD   2 years ago Coronary artery disease involving native heart, unspecified vessel or lesion type, unspecified whether angina present   [facility] Medicine [place], Priscille Heidelberg, MD  3 years ago Benign essential hypertension   [facility] Medicine [place], Velna Hatchet, MD   3 years ago Benign essential hypertension   [facility] Medicine [place], Velna Hatchet, MD              Passed - TSH in normal range and within 360 days    TSH  Date Value Ref Range Status  11/08/2022 4.24 0.40 - 4.50 mIU/L Final          metoprolol tartrate (LOPRESSOR) 50 MG tablet [Pharmacy Med Name: metoprolol tartrate 50 mg tablet] 180 tablet 0    Sig: TAKE 1 TABLET BY MOUTH TWICE DAILY FOR BLOOD PRESSURE     Cardiovascular:  Beta Blockers Failed - 02/12/2023  8:01 AM      Failed - Valid encounter within last 6 months    Recent Outpatient Visits           1 year ago Leg swelling   [facility] Donita Brooks, MD   2 years ago Benign neoplasm of right clavicle   [facility] Donita Brooks, MD   2 years ago Coronary artery disease involving native heart, unspecified vessel or lesion type, unspecified whether angina present   [facility] Medicine Donita Brooks, MD   3 years ago Benign essential hypertension   [facility] [place], Velna Hatchet, MD   3 years ago Benign essential hypertension   [facility] Medicine [place], Velna Hatchet, MD              Passed - Last BP in normal range    BP Readings from Last 1 Encounters:  11/08/22 112/68         Passed - Last Heart Rate in normal range    Pulse Readings from Last 1 Encounters:  11/08/22 75          escitalopram (LEXAPRO) 10 MG tablet [Pharmacy Med Name: escitalopram 10 mg tablet] 90 tablet 0    Sig: TAKE 1 TABLET BY MOUTH EVERY DAY     Psychiatry:  Antidepressants - SSRI Failed - 02/12/2023  8:01 AM      Failed - Valid encounter within last 6 months    Recent Outpatient Visits           1 year ago Leg swelling   [facility] Donita Brooks, MD   2 years ago Benign neoplasm of right clavicle   [facility] Donita Brooks, MD   2 years ago Coronary artery disease involving native heart, unspecified vessel or lesion type, unspecified whether angina present   [facility] Donita Brooks, MD   3 years ago Benign essential hypertension   [facility] Medicine [place], Velna Hatchet, MD   3 years ago Benign essential hypertension   [facility] Medicine [place], Velna Hatchet, MD              Passed - Completed PHQ-2 or PHQ-9 in the last 360 days

## 2023-05-13 ENCOUNTER — Other Ambulatory Visit: Payer: Self-pay | Admitting: Family Medicine

## 2023-05-24 ENCOUNTER — Other Ambulatory Visit: Payer: Self-pay | Admitting: Family Medicine

## 2023-05-24 NOTE — Telephone Encounter (Unsigned)
 Copied from CRM [id number]. Topic: Clinical - Medication Refill >> May 24, 2023 10:09 AM Martha Clan wrote: Most Recent Primary Care Visit:  Provider: Lynnea Ferrier T  Department: [facility]  Visit Type: SAME DAY  Date: 11/08/2022  Medication: gabapentin (NEURONTIN) 100 MG capsule [[id number]]  Has the patient contacted their pharmacy? Yes (Agent: If no, request that the patient contact the pharmacy for the refill. If patient does not wish to contact the pharmacy document the reason why and proceed with request.) (Agent: If yes, when and what did the pharmacy advise?)  Is this the correct pharmacy for this prescription? Yes If no, delete pharmacy and type the correct one.  This is the patient's preferred pharmacy:  [facility] - [place], [place] - [address] [place] [address] Phone: [phone] Fax: [phone]   Has the prescription been filled recently? No  Is the patient out of the medication? Yes  Has the patient been seen for an appointment in the last year OR does the patient have an upcoming appointment? Yes  Can we respond through MyChart? Yes  Agent: Please be advised that Rx refills may take up to 3 business days. We ask that you follow-up with your pharmacy.

## 2023-06-18 IMAGING — US US THYROID
1 series · 14 of 25 positions shown · non-contrast
Comparison: None.

CLINICAL DATA: Nodule on exam

EXAM:
THYROID ULTRASOUND
TECHNIQUE: Ultrasound examination of the thyroid gland and adjacent soft
tissues was performed.

[Series 1: us thyroid · 0.05mm/px · 14 of 44 slices shown]
[im 1/44]
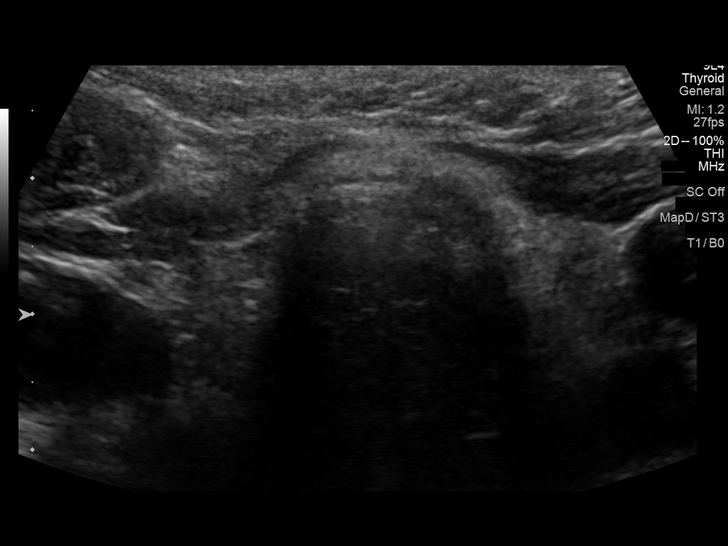
[im 4/44]
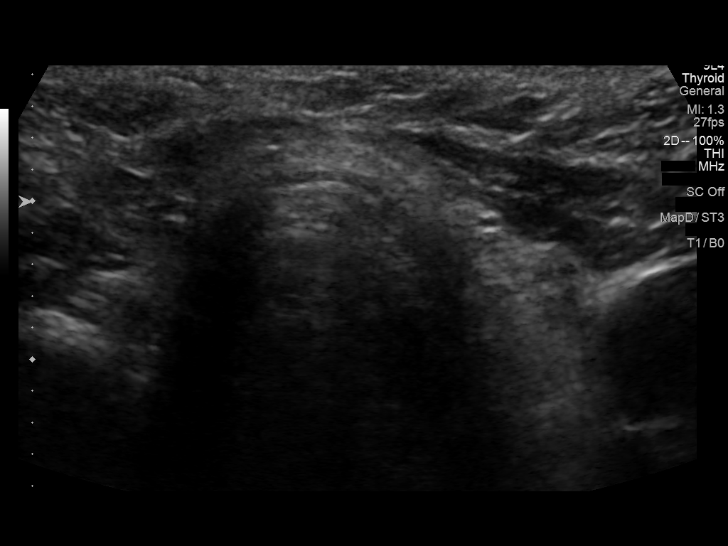
[im 8/44]
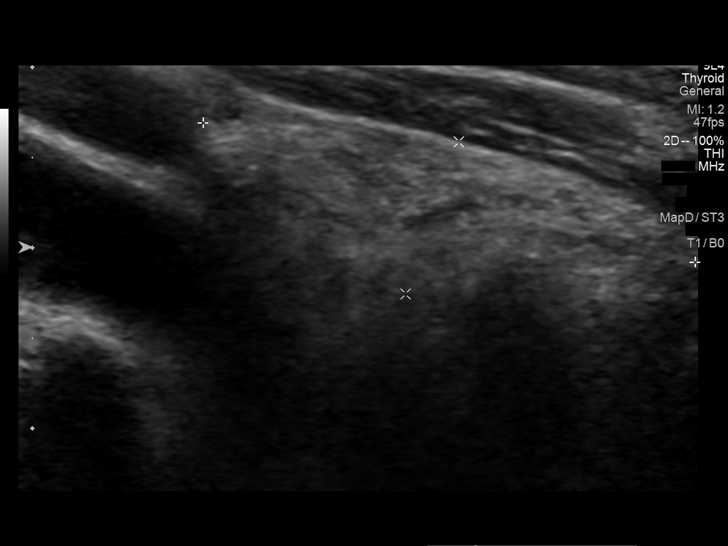
[im 11/44]
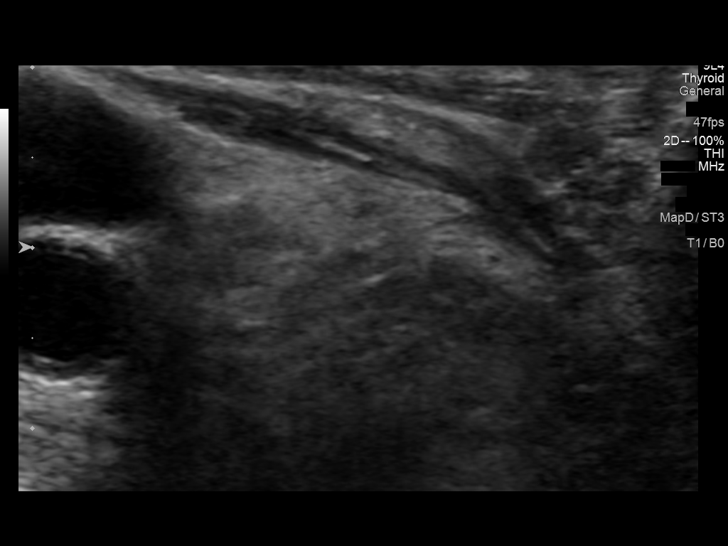
[im 15/44]
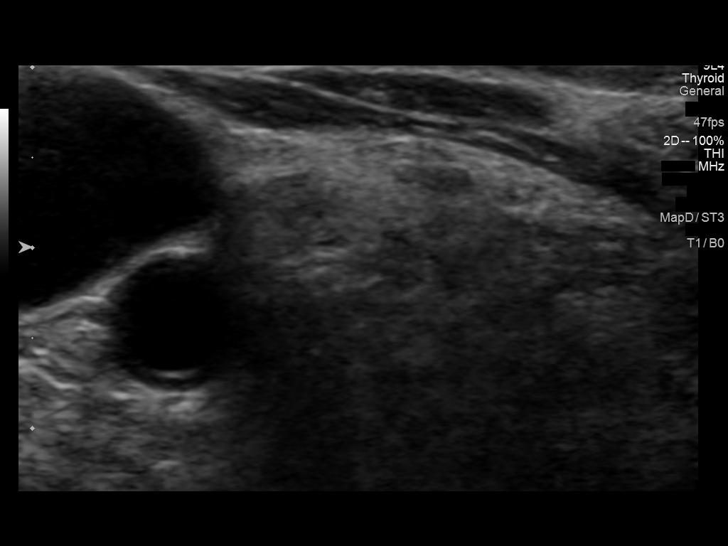
[im 17/44]
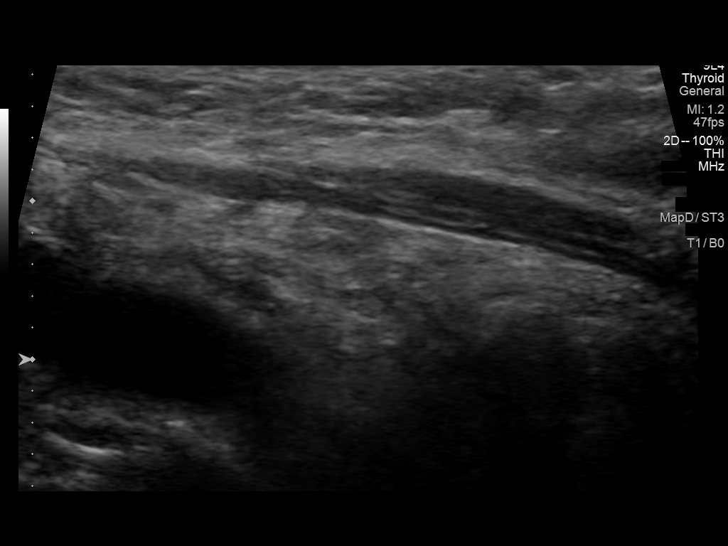
[im 20/44]
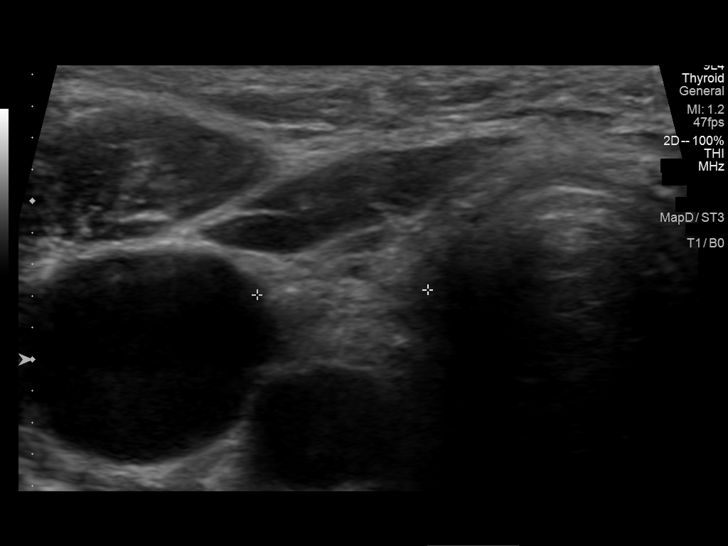
[im 24/44]
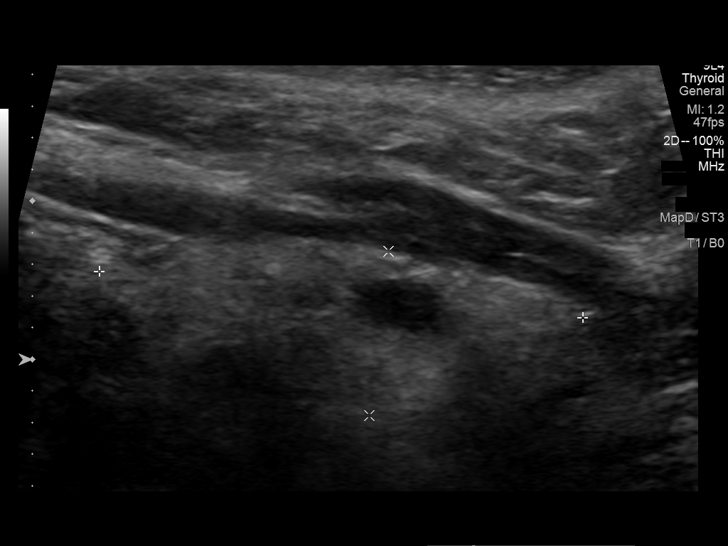
[im 27/44]
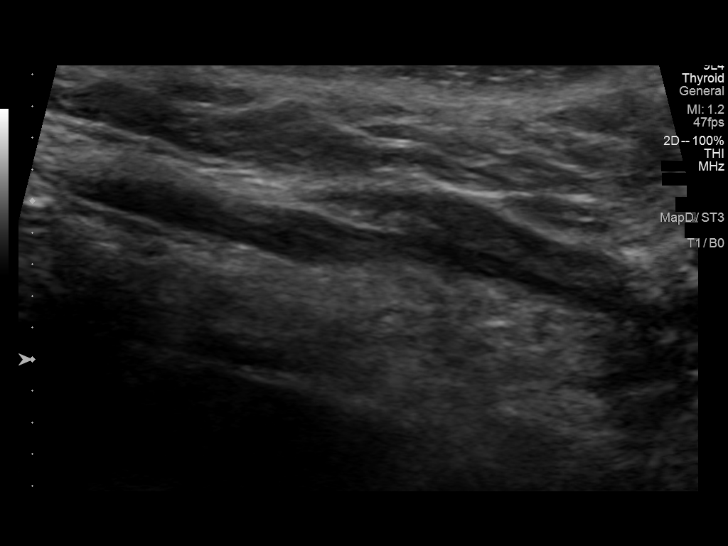
[im 29/44]
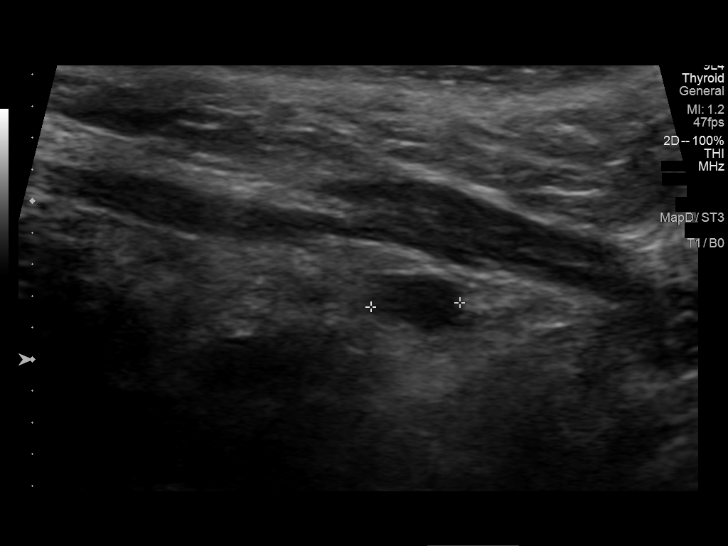
[im 33/44]
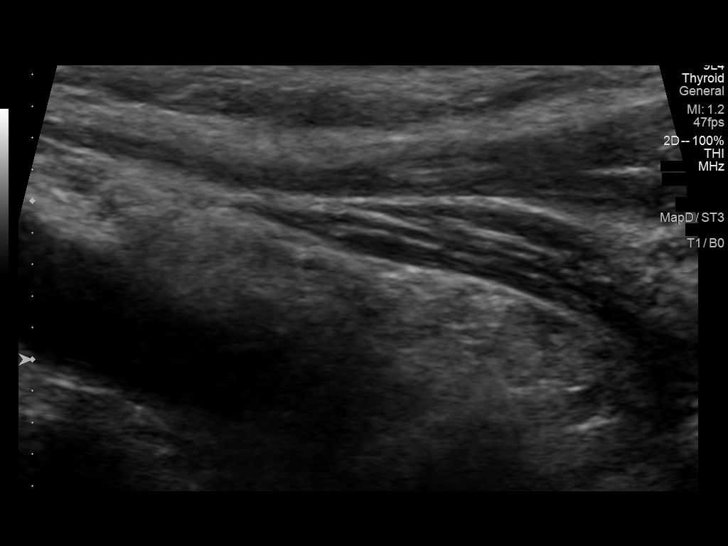
[im 36/44]
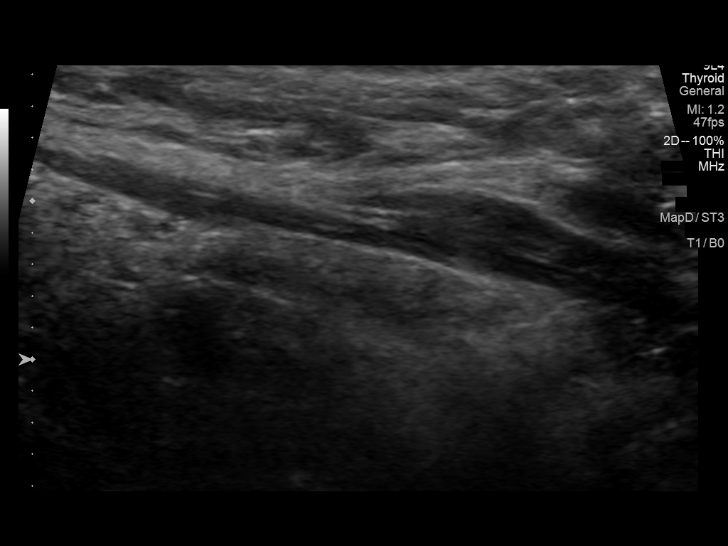
[im 40/44]
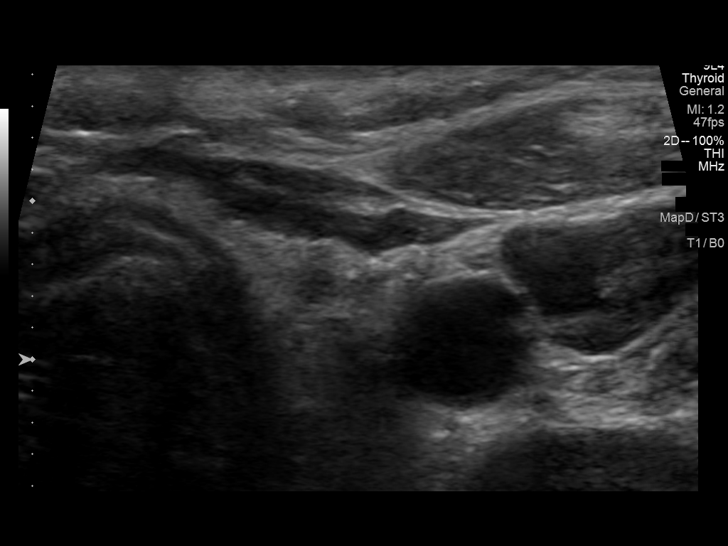
[im 44/44]
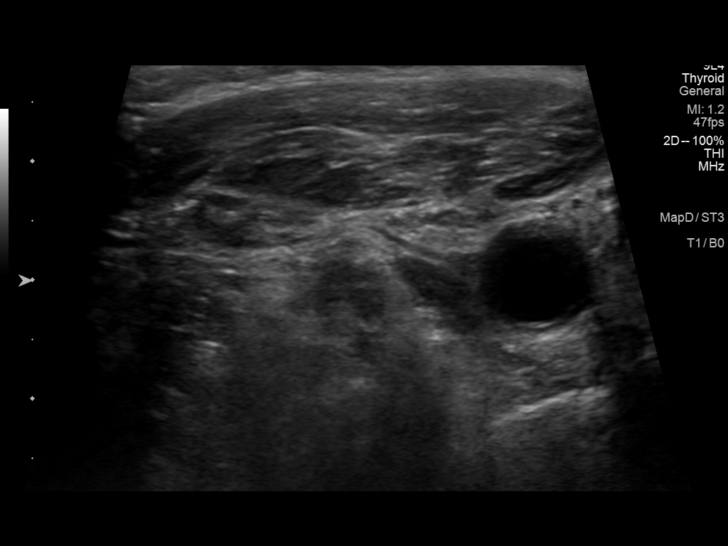

[14 of 25 positions shown; findings below may reference images not displayed]

FINDINGS: Parenchymal Echotexture: Mildly heterogenous

Isthmus: 4 mm

Right lobe: 2.8 x 0.9 x 1.1 cm

Left lobe: 3.1 x 1.0 x 1.0 cm

_________________________________________________________

Estimated total number of nodules >/= 1 cm: 0

Number of spongiform nodules >/=  2 cm not described below (TR1): 0

Number of mixed cystic and solid nodules >/= 1.5 cm not described
below (TR2): 0

_________________________________________________________

Mildly heterogeneous atrophic thyroid. Subcentimeter cystic nodule
in the left inferior thyroid measures only 6 mm. This would not meet
criteria for any biopsy or follow-up it is not fully detailed by tie
rads criteria.

No hypervascularity.  No regional adenopathy.
IMPRESSION: Mildly heterogeneous atrophic thyroid. Single subcentimeter cystic
nodule noted in the left inferior thyroid. Findings suggest chronic
medical thyroid disease.

The above is in keeping with the ACR TI-RADS recommendations - [HOSPITAL] 5018;[DATE].

## 2023-06-26 ENCOUNTER — Ambulatory Visit

## 2023-06-29 ENCOUNTER — Telehealth: Payer: Self-pay

## 2023-06-29 NOTE — Telephone Encounter (Signed)
 Copied from CRM [id number]. Topic: Clinical - Request for Lab/Test Order >> Jun 29, 2023 10:31 AM Patricia Morton wrote: Reason for CRM: Patient would like to ask for Lab or thyroid level checked

## 2023-07-03 ENCOUNTER — Ambulatory Visit: Admitting: Family Medicine

## 2023-07-03 ENCOUNTER — Ambulatory Visit: Attending: Family Medicine

## 2023-07-03 VITALS — BP 110/58 | HR 55 | Temp 98.6°F | Ht 64.0 in | Wt 150.0 lb

## 2023-07-03 DIAGNOSIS — M7989 Other specified soft tissue disorders: Secondary | ICD-10-CM

## 2023-07-03 DIAGNOSIS — M25512 Pain in left shoulder: Secondary | ICD-10-CM | POA: Diagnosis not present

## 2023-07-03 DIAGNOSIS — R002 Palpitations: Secondary | ICD-10-CM

## 2023-07-03 DIAGNOSIS — E039 Hypothyroidism, unspecified: Secondary | ICD-10-CM

## 2023-07-03 LAB — CBC WITH DIFFERENTIAL/PLATELET
Absolute Lymphocytes: 1832 {cells}/uL (ref 850–3900)
Absolute Monocytes: 692 {cells}/uL (ref 200–950)
Basophils Absolute: 8 {cells}/uL (ref 0–200)
Basophils Relative: 0.1 %
Eosinophils Absolute: 198 {cells}/uL (ref 15–500)
Eosinophils Relative: 2.6 %
HCT: 42.9 % (ref 35.0–45.0)
Hemoglobin: 14.1 g/dL (ref 11.7–15.5)
MCH: 30.8 pg (ref 27.0–33.0)
MCHC: 32.9 g/dL (ref 32.0–36.0)
MCV: 93.7 fL (ref 80.0–100.0)
MPV: 9.6 fL (ref 7.5–12.5)
Monocytes Relative: 9.1 %
Neutro Abs: 4872 {cells}/uL (ref 1500–7800)
Neutrophils Relative %: 64.1 %
Platelets: 463 10*3/uL — ABNORMAL HIGH (ref 140–400)
RBC: 4.58 10*6/uL (ref 3.80–5.10)
RDW: 13.5 % (ref 11.0–15.0)
Total Lymphocyte: 24.1 %
WBC: 7.6 10*3/uL (ref 3.8–10.8)

## 2023-07-03 LAB — COMPLETE METABOLIC PANEL WITHOUT GFR
AG Ratio: 1.4 (calc) (ref 1.0–2.5)
ALT: 11 U/L (ref 6–29)
AST: 15 U/L (ref 10–35)
Albumin: 4.2 g/dL (ref 3.6–5.1)
Alkaline phosphatase (APISO): 86 U/L (ref 37–153)
BUN: 9 mg/dL (ref 7–25)
CO2: 32 mmol/L (ref 20–32)
Calcium: 9.7 mg/dL (ref 8.6–10.4)
Chloride: 100 mmol/L (ref 98–110)
Creat: 0.95 mg/dL (ref 0.60–0.95)
Globulin: 2.9 g/dL (ref 1.9–3.7)
Glucose, Bld: 77 mg/dL (ref 65–99)
Potassium: 4 mmol/L (ref 3.5–5.3)
Sodium: 141 mmol/L (ref 135–146)
Total Bilirubin: 0.5 mg/dL (ref 0.2–1.2)
Total Protein: 7.1 g/dL (ref 6.1–8.1)

## 2023-07-03 LAB — TSH: TSH: 3.12 m[IU]/L (ref 0.40–4.50)

## 2023-07-03 MED ORDER — TRIAMCINOLONE ACETONIDE 40 MG/ML IJ SUSP
80.0000 mg | Freq: Once | INTRAMUSCULAR | Status: AC
Start: 2023-07-03 — End: 2023-07-03
  Administered 2023-07-03: 80 mg via INTRA_ARTICULAR

## 2023-07-03 MED ORDER — HYDROCHLOROTHIAZIDE 25 MG PO TABS: 25.0000 mg | ORAL_TABLET | Freq: Every day | ORAL | 3 refills | Status: DC

## 2023-07-03 NOTE — Addendum Note (Signed)
 Addended by: Gillermo Lack K on: 07/03/2023 11:45 AM   Modules accepted: Orders

## 2023-07-03 NOTE — Progress Notes (Signed)
 Subjective:    Patient ID: Patricia Morton, female    DOB: 01/31/1927, 89 y.o.   MRN: [id number]  HPI Patient presents today with a few concerns.  First, she reports swelling in her legs.  She states that in the morning the swelling is not bad but in the evening the swelling is very bad.  Today she has trace edema.  She does have a large varicose veins on both legs up to the knee.  She denies pain or shortness of breath orthopnea.  Her blood pressure is well-controlled.  She is on amlodipine  for potential.  Says she report pain in her left shoulder.  She reports pain overhead activity.  She reports pain with range of motion.  She has crepitus with passive range of motion.  She has pain with empty can testing.  The pain has been present in her left shoulder for approximately 1 month.  Third she reports her heart racing.  She states that this occurs 1-2 times a week.  The heart will start beating rapidly for no reason and beating irregularly.  She denies any syncope. Past Medical History:  Diagnosis Date   Anxiety    Arthritis    Cataract    surgically removed   Hyperlipemia    Hypertension    Hypothyroid    Urticaria    Past Surgical History:  Procedure Laterality Date   ABDOMINAL HYSTERECTOMY     BREAST SURGERY     right breast -lumpectomy-benign   CYSTECTOMY     EYE SURGERY     HAND SURGERY Right 02/2019   TOTAL KNEE ARTHROPLASTY Right 05/25/2015   Procedure: TOTAL KNEE ARTHROPLASTY;  Surgeon: Liliane Rei, MD;  Location: [facility];  Service: Orthopedics;  Laterality: Right;   TOTAL KNEE ARTHROPLASTY Left 12/14/2015   Procedure: LEFT TOTAL KNEE ARTHROPLASTY;  Surgeon: Liliane Rei, MD;  Location: [facility];  Service: Orthopedics;  Laterality: Left;   Current Outpatient Medications on File Prior to Visit  Medication Sig Dispense Refill   amLODipine  (NORVASC ) 10 MG tablet Take 1 tablet (10 mg total) by mouth daily. 90 tablet 3   calcium carbonate (OSCAL) 1500 (600 Ca) MG TABS tablet Take by  mouth 2 (two) times daily with a meal.     cetirizine  (ZYRTEC ) 10 MG tablet TAKE 1 TABLET BY MOUTH EVERY DAY 90 tablet 1   EPINEPHrine  0.3 mg/0.3 mL IJ SOAJ injection Inject 0.3 mLs (0.3 mg total) into the muscle as needed for anaphylaxis. 1 Device 1   escitalopram  (LEXAPRO ) 10 MG tablet TAKE 1 TABLET BY MOUTH EVERY DAY 90 tablet 1   furosemide  (LASIX ) 40 MG tablet TAKE 1 TABLET BY MOUTH DAILY 90 tablet 1   gabapentin  (NEURONTIN ) 100 MG capsule Take 1 capsule (100 mg total) by mouth 3 (three) times daily as needed (nerve pain). 90 capsule 3   levothyroxine  (SYNTHROID ) 25 MCG tablet TAKE 1 TABLET BY MOUTH EVERY DAY BEFORE breakfast, take in addition TO 50 mcg TO equal 75mcg 90 tablet 1   levothyroxine  (SYNTHROID ) 50 MCG tablet TAKE 1 TABLET BY MOUTH DAILY 90 tablet 1   metoprolol  tartrate (LOPRESSOR ) 50 MG tablet TAKE 1 TABLET BY MOUTH TWICE DAILY FOR BLOOD PRESSURE 180 tablet 1   pantoprazole  (PROTONIX ) 40 MG tablet Take 1 tablet (40 mg total) by mouth daily. 90 tablet 3   simvastatin  (ZOCOR ) 40 MG tablet TAKE 1 TABLET BY MOUTH AT BEDTIME 90 tablet 1   neomycin -bacitracin -polymyxin (NEOSPORIN) ophthalmic ointment Place 1 Application into  the right eye 4 (four) times daily. (Patient not taking: Reported on 07/03/2023) 3.5 g 0   Current Facility-Administered Medications on File Prior to Visit  Medication Dose Route Frequency Provider Last Rate Last Admin   omalizumab  (XOLAIR ) injection 150 mg  150 mg Subcutaneous Q28 days Trudy Fusi, DO   150 mg at 09/09/20 1019   Allergies  Allergen Reactions   Celexa [Citalopram Hydrobromide] Hives   Social History   Socioeconomic History   Marital status: Widowed    Spouse name: Not on file   Number of children: 2   Years of education: Not on file   Highest education level: Not on file  Occupational History   Not on file  Tobacco Use   Smoking status: Former    Current packs/day: 0.00    Types: Cigarettes    Start date: 59    Quit date: 2004     Years since quitting: 21.3   Smokeless tobacco: Never  Vaping Use   Vaping status: Never Used  Substance and Sexual Activity   Alcohol use: Not Currently    Comment: wine occassionally   Drug use: No   Sexual activity: Not Currently  Other Topics Concern   Not on file  Social History Narrative   Daughter is primary caregiver    Worked at [employer] as Conservation officer, nature    Social Drivers of Health   Financial Resource Strain: Low Risk  (02/15/2022)   Overall Financial Resource Strain (CARDIA)    Difficulty of Paying Living Expenses: Not hard at all  Food Insecurity: No Food Insecurity (02/15/2022)   Hunger Vital Sign    Worried About Running Out of Food in the Last Year: Never true    Ran Out of Food in the Last Year: Never true  Transportation Needs: No Transportation Needs (02/15/2022)   PRAPARE - Administrator, Civil Service (Medical): No    Lack of Transportation (Non-Medical): No  Physical Activity: Unknown (02/15/2022)   Exercise Vital Sign    Days of Exercise per Week: Not on file    Minutes of Exercise per Session: 0 min  Stress: No Stress Concern Present (02/15/2022)   [employer] of Occupational Health - Occupational Stress Questionnaire    Feeling of Stress : Not at all  Social Connections: Moderately Integrated (02/15/2022)   Social Connection and Isolation Panel [NHANES]    Frequency of Communication with Friends and Family: More than three times a week    Frequency of Social Gatherings with Friends and Family: Three times a week    Attends Religious Services: More than 4 times per year    Active Member of Clubs or Organizations: Yes    Attends Banker Meetings: More than 4 times per year    Marital Status: Widowed  Intimate Partner Violence: Not At Risk (02/15/2022)   Humiliation, Afraid, Rape, and Kick questionnaire    Fear of Current or Ex-Partner: No    Emotionally Abused: No    Physically Abused: No    Sexually Abused: No      Review of Systems  All other systems reviewed and are negative.      Objective:   Physical Exam Vitals reviewed.  Constitutional:      Appearance: Normal appearance.  Cardiovascular:     Rate and Rhythm: Normal rate and regular rhythm.     Heart sounds: Normal heart sounds. No murmur heard.    No friction rub. No gallop.  Pulmonary:  Effort: Pulmonary effort is normal. No respiratory distress.     Breath sounds: Normal breath sounds. No wheezing, rhonchi or rales.  Abdominal:     General: Bowel sounds are normal. There is no distension.     Palpations: Abdomen is soft.     Tenderness: There is no abdominal tenderness. There is no guarding or rebound.  Musculoskeletal:     Left shoulder: Tenderness and crepitus present. Decreased range of motion.     Right lower leg: No edema.     Left lower leg: No edema.  Neurological:     Mental Status: She is alert.           Assessment & Plan:  Hypothyroidism, unspecified type - Plan: CBC with Differential/Platelet, COMPLETE METABOLIC PANEL WITHOUT GFR, TSH  Palpitations - Plan: LONG TERM MONITOR (3-14 DAYS)  Acute pain of left shoulder  Leg swelling Given her history of hypothyroidism, I would like to check a TSH to ensure that thyroid  abnormalities are not causing the palpitations.  Second I would like to arrange for a ZIO monitor for 14 days to rule out atrial fibrillation or other dangerous arrhythmias as a cause of her palpitations.  I believe the leg swelling is due to a combination of chronic venous insufficiency coupled with amlodipine .  Will discontinue amlodipine  placed with hydrochlorothiazide  25 mg daily.  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  Using sterile technique, I injected the left shoulder with 2 cc of lidocaine, 2 cc of Marcaine , and 2 cc of 40 mg/mL Kenalog .

## 2023-07-03 NOTE — Progress Notes (Unsigned)
 EP to read.

## 2023-07-31 ENCOUNTER — Ambulatory Visit: Admitting: Family Medicine

## 2023-07-31 ENCOUNTER — Encounter: Payer: Self-pay | Admitting: Family Medicine

## 2023-07-31 VITALS — BP 114/80 | HR 71 | Temp 98.2°F | Ht 64.0 in | Wt 146.1 lb

## 2023-07-31 DIAGNOSIS — G4700 Insomnia, unspecified: Secondary | ICD-10-CM

## 2023-07-31 DIAGNOSIS — R002 Palpitations: Secondary | ICD-10-CM | POA: Diagnosis not present

## 2023-07-31 DIAGNOSIS — I10 Essential (primary) hypertension: Secondary | ICD-10-CM | POA: Diagnosis not present

## 2023-07-31 DIAGNOSIS — E039 Hypothyroidism, unspecified: Secondary | ICD-10-CM | POA: Diagnosis not present

## 2023-07-31 MED ORDER — HYDROCHLOROTHIAZIDE 12.5 MG PO TABS: 12.5000 mg | ORAL_TABLET | Freq: Every day | ORAL | 1 refills | Status: DC

## 2023-07-31 NOTE — Progress Notes (Signed)
 Patient Office Visit  Assessment & Plan:  Benign essential hypertension -     CBC with Differential/Platelet -     Comprehensive metabolic panel with GFR -     hydroCHLOROthiazide ; Take 1 tablet (12.5 mg total) by mouth daily.  Dispense: 90 tablet; Refill: 1  Insomnia, unspecified type  Palpitation -     Magnesium  Acquired hypothyroidism   Assessment and Plan    Dizziness and balance issues Chronic dizziness and balance issues, possibly due to hypotension, diuretic use, or arrhythmias. Differential includes atrial fibrillation and orthostatic hypotension. - Reduce hydrochlorothiazide  dosage. - Check blood work for magnesium, potassium, and sodium. - Await Zeopatch heart monitor results. - Encourage walker use to prevent falls.  Hypotension Hypotension possibly related to hydrochlorothiazide . Previous readings were lower than current. - Reduce hydrochlorothiazide  dosage. - Monitor blood pressure regularly.  Polyuria due to diuretic use Increased urination from hydrochlorothiazide , raising concerns about dehydration and electrolyte imbalance. - Reduce hydrochlorothiazide  dosage. - Check blood work for electrolyte levels.  Heart disease Heart disease with previous evaluation showing no significant concerns. Symptoms may relate to arrhythmias. - Await Zeopatch heart monitor results. - Consider beta blockers if arrhythmias confirmed, with caution in elderly.  Neuropathy Chronic neuropathy managed with gabapentin , which also aids sleep and restless leg syndrome. - Continue gabapentin  at 100 mg at night.  Insomnia Recent insomnia possibly due to inconsistent gabapentin  use. Gabapentin  effective for sleep. - Resume consistent gabapentin  at 100 mg at night.      Follow-up on lab work and notify patient.  Reduce hydrochlorothiazide  dosage to 12.5 mg once a day.  Patient will restart taking Gabapentin  at night time. Patient is to follow-up if worsening symptoms or no  improvement.  We discussed briefly physical therapy but daughter unsure of the benefit at this time. Addendum- sodium 124- will repeat BMP this Friday to see trend. Pt will stop diuretic for now. Potassium 3.1- will send KCL tablets to pharmacy.  No follow-ups on file.   Subjective:     Patient ID: Patricia Morton, female    DOB: 1926/12/19  Age: 89 y.o. MRN: [id number]  Chief Complaint  Patient presents with   Dizziness    X 1 week. Pt states that she is very off balance and feels "swimmy headed"    Dizziness   Discussed the use of AI scribe software for clinical note transcription with the patient, who gave verbal consent to proceed.  History of Present Illness   Patricia Morton is a 89 year old female with hypertension who presents with dizziness and balance issues.  She experiences dizziness, particularly when standing up, which has worsened over the past couple of weeks. There have been no episodes of syncope or falls, but she feels she loses her balance easily. She uses a cane and has a walker available if needed. Her blood pressure has been low recently.  She recently returned a Zeopatch heart monitor, but results are not yet available. She has a history of low blood pressure readings, and her current medications include hydrochlorothiazide , which she started about three weeks ago after discontinuing amlodipine  due to swelling. She reports excessive urination since starting the diuretic and is concerned about potential low potassium and sodium levels.  Her appetite has decreased over the past few days, but she maintains adequate fluid intake, including water , tea, coffee, and recently started using Propel for electrolytes. No recent urinary tract infections and she drinks a significant amount of water  daily.  She experiences  occasional heart palpitations, particularly at night, and has tingling in her fingers. She takes metoprolol  at night, which she believes helps with her heart rate.  She also takes gabapentin  for neuropathy and restless leg syndrome, which she finds beneficial for sleep, although she has not been sleeping well for the past three to four nights.      Results LABS TSH: normal (06/2023), reviewed other labs during office visit.  Assessment & Plan Dizziness and balance issues ongoing  Chronic dizziness and balance issues, possibly due to orthostatic hypotension, diuretic use, or arrhythmias. Differential includes atrial fibrillation and orthostatic hypotension. - Reduce hydrochlorothiazide  dosage to 12.5mg  once per day.  - Check blood work for magnesium, potassium, and sodium. - Await Ziopatch heart monitor results. - Encourage walker use to prevent falls- has at home. Briefly discussed PT to improve balance/fall risk reduction but daughter not sure if beneficial at this time  Hypotension- related to orthostatic changes?  Hypotension possibly related to hydrochlorothiazide . Previous readings were lower than current. - Reduce hydrochlorothiazide  dosage. - Monitor blood pressure regularly.  Polyuria due to diuretic use Increased urination from hydrochlorothiazide , raising concerns about dehydration and electrolyte imbalance. - Reduce hydrochlorothiazide  dosage. - Check blood work for electrolyte levels.  Palpitations/presyncope Heart disease with previous evaluation showing no significant concerns. Symptoms may relate to arrhythmias. - Await Ziopatch heart monitor results. - pt is already on a beta blocker Tenormin 50mg  BID  Neuropathy Chronic neuropathy managed with gabapentin , which also aids sleep and restless leg syndrome. Pt was only taking Gabapentin  100mg  at night time but stopped the past few days.  - Continue gabapentin  at 100 mg at night.  Insomnia Recent insomnia possibly due to inconsistent gabapentin  use. Gabapentin  effective for sleep. - Resume consistent gabapentin  at 100 mg at night.    The ASCVD Risk score (Arnett DK, et al.,  2019) failed to calculate for the following reasons:   The 2019 ASCVD risk score is only valid for ages 54 to 64  Past Medical History:  Diagnosis Date   Anxiety    Arthritis    Cataract    surgically removed   Hyperlipemia    Hypertension    Hypothyroid    Urticaria    Past Surgical History:  Procedure Laterality Date   ABDOMINAL HYSTERECTOMY     BREAST SURGERY     right breast -lumpectomy-benign   CYSTECTOMY     EYE SURGERY     HAND SURGERY Right 02/2019   TOTAL KNEE ARTHROPLASTY Right 05/25/2015   Procedure: TOTAL KNEE ARTHROPLASTY;  Surgeon: Liliane Rei, MD;  Location: [facility];  Service: Orthopedics;  Laterality: Right;   TOTAL KNEE ARTHROPLASTY Left 12/14/2015   Procedure: LEFT TOTAL KNEE ARTHROPLASTY;  Surgeon: Liliane Rei, MD;  Location: [facility];  Service: Orthopedics;  Laterality: Left;   Social History   Tobacco Use   Smoking status: Former    Current packs/day: 0.00    Types: Cigarettes    Start date: 41    Quit date: 2004    Years since quitting: 21.3   Smokeless tobacco: Never  Vaping Use   Vaping status: Never Used  Substance Use Topics   Alcohol use: Not Currently    Comment: wine occassionally   Drug use: No   Family History  Problem Relation Age of Onset   Heart disease Brother    Hypothyroidism Mother    Hypotension Sister    Allergic rhinitis Neg Hx    Asthma Neg Hx    Eczema Neg  Hx    Urticaria Neg Hx    Allergies  Allergen Reactions   Celexa [Citalopram Hydrobromide] Hives    Review of Systems  Neurological:  Positive for dizziness.      Objective:    BP 114/80   Pulse 71   Temp 98.2 F (36.8 C)   Ht 5\' 4"  (1.626 m)   Wt 146 lb 2 oz (66.3 kg)   SpO2 97%   BMI 25.08 kg/m  BP Readings from Last 3 Encounters:  07/31/23 114/80  07/03/23 (!) 110/58  11/08/22 112/68   Wt Readings from Last 3 Encounters:  07/31/23 146 lb 2 oz (66.3 kg)  07/03/23 150 lb (68 kg)  11/08/22 153 lb 9.6 oz (69.7 kg)    Physical  Exam Vitals and nursing note reviewed.  Constitutional:      General: She is not in acute distress.    Appearance: Normal appearance.     Comments: Comes with her daughter, using cane (not walker)  HENT:     Head: Normocephalic.     Right Ear: Tympanic membrane, ear canal and external ear normal.     Left Ear: Tympanic membrane, ear canal and external ear normal.     Ears:     Comments: Has hearing aids Eyes:     Extraocular Movements: Extraocular movements intact.     Pupils: Pupils are equal, round, and reactive to light.  Neck:     Vascular: No carotid bruit.  Cardiovascular:     Rate and Rhythm: Normal rate and regular rhythm.     Heart sounds: Normal heart sounds.  Pulmonary:     Effort: Pulmonary effort is normal.     Breath sounds: Normal breath sounds. No wheezing.  Musculoskeletal:     Right lower leg: No edema.     Left lower leg: No edema.  Neurological:     General: No focal deficit present.     Mental Status: She is alert and oriented to person, place, and time.     Motor: No weakness.     Coordination: Coordination abnormal.     Gait: Gait abnormal and tandem walk abnormal.     Comments: Patient unable to do tandem gait without assistance.   Psychiatric:        Mood and Affect: Mood normal.        Behavior: Behavior normal.      Results for orders placed or performed in visit on 07/31/23  CBC with Differential/Platelet  Result Value Ref Range   WBC 8.9 3.8 - 10.8 Thousand/uL   RBC 5.19 (H) 3.80 - 5.10 Million/uL   Hemoglobin 15.9 (H) 11.7 - 15.5 g/dL   HCT 40.9 (H) 81.1 - 91.4 %   MCV 92.5 80.0 - 100.0 fL   MCH 30.6 27.0 - 33.0 pg   MCHC 33.1 32.0 - 36.0 g/dL   RDW 78.2 95.6 - 21.3 %   Platelets 479 (H) 140 - 400 Thousand/uL   MPV 8.9 7.5 - 12.5 fL   Neutro Abs 5,972 1,500 - 7,800 cells/uL   Absolute Lymphocytes 1,905 850 - 3,900 cells/uL   Absolute Monocytes 908 200 - 950 cells/uL   Eosinophils Absolute 107 15 - 500 cells/uL   Basophils Absolute  9 0 - 200 cells/uL   Neutrophils Relative % 67.1 %   Total Lymphocyte 21.4 %   Monocytes Relative 10.2 %   Eosinophils Relative 1.2 %   Basophils Relative 0.1 %  Comprehensive metabolic panel with GFR  Result  Value Ref Range   Glucose, Bld 104 (H) 65 - 99 mg/dL   BUN 11 7 - 25 mg/dL   Creat 1.61 (H) 0.96 - 0.95 mg/dL   eGFR 51 (L) > OR = 60 mL/min/1.36m2   BUN/Creatinine Ratio 11 6 - 22 (calc)   Sodium 124 (L) 135 - 146 mmol/L   Potassium 3.1 (L) 3.5 - 5.3 mmol/L   Chloride <80 (L) 98 - 110 mmol/L   CO2 37 (H) 20 - 32 mmol/L   Calcium 9.5 8.6 - 10.4 mg/dL   Total Protein 7.3 6.1 - 8.1 g/dL   Albumin 4.4 3.6 - 5.1 g/dL   Globulin 2.9 1.9 - 3.7 g/dL (calc)   AG Ratio 1.5 1.0 - 2.5 (calc)   Total Bilirubin 0.6 0.2 - 1.2 mg/dL   Alkaline phosphatase (APISO) 78 37 - 153 U/L   AST 23 10 - 35 U/L   ALT 13 6 - 29 U/L  Magnesium  Result Value Ref Range   Magnesium 2.1 1.5 - 2.5 mg/dL

## 2023-08-01 ENCOUNTER — Other Ambulatory Visit: Payer: Self-pay

## 2023-08-01 ENCOUNTER — Encounter: Payer: Self-pay | Admitting: Family Medicine

## 2023-08-01 ENCOUNTER — Ambulatory Visit: Payer: Self-pay | Admitting: Family Medicine

## 2023-08-01 LAB — CBC WITH DIFFERENTIAL/PLATELET
Absolute Lymphocytes: 1905 {cells}/uL (ref 850–3900)
Absolute Monocytes: 908 {cells}/uL (ref 200–950)
Basophils Absolute: 9 {cells}/uL (ref 0–200)
Basophils Relative: 0.1 %
Eosinophils Absolute: 107 {cells}/uL (ref 15–500)
Eosinophils Relative: 1.2 %
HCT: 48 % — ABNORMAL HIGH (ref 35.0–45.0)
Hemoglobin: 15.9 g/dL — ABNORMAL HIGH (ref 11.7–15.5)
MCH: 30.6 pg (ref 27.0–33.0)
MCHC: 33.1 g/dL (ref 32.0–36.0)
MCV: 92.5 fL (ref 80.0–100.0)
MPV: 8.9 fL (ref 7.5–12.5)
Monocytes Relative: 10.2 %
Neutro Abs: 5972 {cells}/uL (ref 1500–7800)
Neutrophils Relative %: 67.1 %
Platelets: 479 10*3/uL — ABNORMAL HIGH (ref 140–400)
RBC: 5.19 10*6/uL — ABNORMAL HIGH (ref 3.80–5.10)
RDW: 13.3 % (ref 11.0–15.0)
Total Lymphocyte: 21.4 %
WBC: 8.9 10*3/uL (ref 3.8–10.8)

## 2023-08-01 LAB — COMPREHENSIVE METABOLIC PANEL WITH GFR
AG Ratio: 1.5 (calc) (ref 1.0–2.5)
ALT: 13 U/L (ref 6–29)
AST: 23 U/L (ref 10–35)
Albumin: 4.4 g/dL (ref 3.6–5.1)
Alkaline phosphatase (APISO): 78 U/L (ref 37–153)
BUN/Creatinine Ratio: 11 (calc) (ref 6–22)
BUN: 11 mg/dL (ref 7–25)
CO2: 37 mmol/L — ABNORMAL HIGH (ref 20–32)
Calcium: 9.5 mg/dL (ref 8.6–10.4)
Chloride: <80 mmol/L — ABNORMAL LOW (ref 98–110)
Creat: 1.01 mg/dL — ABNORMAL HIGH (ref 0.60–0.95)
Globulin: 2.9 g/dL (ref 1.9–3.7)
Glucose, Bld: 104 mg/dL — ABNORMAL HIGH (ref 65–99)
Potassium: 3.1 mmol/L — ABNORMAL LOW (ref 3.5–5.3)
Sodium: 124 mmol/L — ABNORMAL LOW (ref 135–146)
Total Bilirubin: 0.6 mg/dL (ref 0.2–1.2)
Total Protein: 7.3 g/dL (ref 6.1–8.1)
eGFR: 51 mL/min/{1.73_m2} — ABNORMAL LOW (ref >=60)

## 2023-08-01 LAB — MAGNESIUM: Magnesium: 2.1 mg/dL (ref 1.5–2.5)

## 2023-08-01 MED ORDER — POTASSIUM CHLORIDE CRYS ER 20 MEQ PO TBCR: 20.0000 meq | EXTENDED_RELEASE_TABLET | Freq: Every day | ORAL | 0 refills | Status: AC

## 2023-08-03 DIAGNOSIS — G4700 Insomnia, unspecified: Secondary | ICD-10-CM | POA: Diagnosis not present

## 2023-08-03 DIAGNOSIS — R54 Age-related physical debility: Secondary | ICD-10-CM | POA: Diagnosis not present

## 2023-08-03 DIAGNOSIS — I110 Hypertensive heart disease with heart failure: Secondary | ICD-10-CM | POA: Diagnosis not present

## 2023-08-03 DIAGNOSIS — Z87891 Personal history of nicotine dependence: Secondary | ICD-10-CM | POA: Diagnosis not present

## 2023-08-03 DIAGNOSIS — M170 Bilateral primary osteoarthritis of knee: Secondary | ICD-10-CM | POA: Diagnosis not present

## 2023-08-03 DIAGNOSIS — E871 Hypo-osmolality and hyponatremia: Secondary | ICD-10-CM | POA: Diagnosis not present

## 2023-08-03 DIAGNOSIS — Z66 Do not resuscitate: Secondary | ICD-10-CM | POA: Diagnosis not present

## 2023-08-03 DIAGNOSIS — Z792 Long term (current) use of antibiotics: Secondary | ICD-10-CM | POA: Diagnosis not present

## 2023-08-03 DIAGNOSIS — E876 Hypokalemia: Secondary | ICD-10-CM | POA: Diagnosis not present

## 2023-08-03 DIAGNOSIS — I5032 Chronic diastolic (congestive) heart failure: Secondary | ICD-10-CM | POA: Diagnosis not present

## 2023-08-03 DIAGNOSIS — G2581 Restless legs syndrome: Secondary | ICD-10-CM | POA: Diagnosis not present

## 2023-08-03 DIAGNOSIS — Z96653 Presence of artificial knee joint, bilateral: Secondary | ICD-10-CM | POA: Diagnosis not present

## 2023-08-03 DIAGNOSIS — R42 Dizziness and giddiness: Secondary | ICD-10-CM | POA: Diagnosis not present

## 2023-08-03 DIAGNOSIS — I5030 Unspecified diastolic (congestive) heart failure: Secondary | ICD-10-CM | POA: Diagnosis not present

## 2023-08-03 DIAGNOSIS — E079 Disorder of thyroid, unspecified: Secondary | ICD-10-CM | POA: Diagnosis not present

## 2023-08-03 DIAGNOSIS — Z7952 Long term (current) use of systemic steroids: Secondary | ICD-10-CM | POA: Diagnosis not present

## 2023-08-03 DIAGNOSIS — E063 Autoimmune thyroiditis: Secondary | ICD-10-CM | POA: Diagnosis not present

## 2023-08-03 DIAGNOSIS — Z79899 Other long term (current) drug therapy: Secondary | ICD-10-CM | POA: Diagnosis not present

## 2023-08-03 DIAGNOSIS — R531 Weakness: Secondary | ICD-10-CM | POA: Diagnosis not present

## 2023-08-04 ENCOUNTER — Other Ambulatory Visit

## 2023-08-07 ENCOUNTER — Ambulatory Visit: Payer: Self-pay | Admitting: Family Medicine

## 2023-08-07 DIAGNOSIS — R002 Palpitations: Secondary | ICD-10-CM

## 2023-08-08 ENCOUNTER — Telehealth: Payer: Self-pay

## 2023-08-08 ENCOUNTER — Encounter: Payer: Self-pay | Admitting: Family Medicine

## 2023-08-08 DIAGNOSIS — E871 Hypo-osmolality and hyponatremia: Secondary | ICD-10-CM | POA: Diagnosis not present

## 2023-08-08 DIAGNOSIS — E876 Hypokalemia: Secondary | ICD-10-CM | POA: Diagnosis not present

## 2023-08-08 NOTE — Transitions of Care (Post Inpatient/ED Visit) (Signed)
   08/08/2023  Name: Patricia Morton MRN: [id number] DOB: 04-09-1926  Today's TOC FU Call Status: Today's TOC FU Call Status:: Successful TOC FU Call Completed TOC FU Call Complete Date: 08/08/23 Patient's Name and Date of Birth confirmed.  Transition Care Management Follow-up Telephone Call Date of Discharge: 08/05/23 Discharge Facility: Other [facility]) Name of Other (Non[facility]) Discharge Facility: [facility] Type of Discharge: Inpatient Admission Primary Inpatient Discharge Diagnosis:: Hyponatremia How have you been since you were released from the hospital?: Better Any questions or concerns?: Yes Patient Questions/Concerns:: patients daughter states she is not sleeping well, very anxious and urination frequently which is preventing her from sleeping. Daughter wants to know if patient can stop the diuretic HCTZ, and also what you recommend  to help patient sleep better, is there anything she can take? Patient is not taking the lasix  currently, was told to stop/hold at last office visit. Patient Questions/Concerns Addressed: Notified Provider of Patient Questions/Concerns  Items Reviewed: Did you receive and understand the discharge instructions provided?: Yes Medications obtained,verified, and reconciled?: Yes (Medications Reviewed) (lasix  is currently not being taken- pt told to hold off on taking it and to only take 12.5mg  of HCTZ right now) Any new allergies since your discharge?: No Dietary orders reviewed?: NA Do you have support at home?: Yes People in Home [RPT]: child(ren), adult Name of Support/Comfort Primary Source: [name]/daughter  Medications Reviewed Today: Medications Reviewed Today   Medications were not reviewed in this encounter     Home Care and Equipment/Supplies: Were Home Health Services Ordered?: Yes Name of Home Health Agency:: unknown- hospital did not give patients daughter a name of agency Has Agency set up a time to come to  your home?: No (waiting on a call from agency to setup appointment for them to come out, daughter states they are supppose to contact her today.) Any new equipment or medical supplies ordered?: NA  Functional Questionnaire: Do you need assistance with bathing/showering or dressing?: No Do you need assistance with meal preparation?: No Do you need assistance with eating?: No Do you have difficulty maintaining continence: No Do you need assistance with getting out of bed/getting out of a chair/moving?: No Do you have difficulty managing or taking your medications?: No  Follow up appointments reviewed: PCP Follow-up appointment confirmed?: Yes Date of PCP follow-up appointment?: 08/17/23 Follow-up Provider: Dr. Juanda Noon Follow-up appointment confirmed?: NA Do you need transportation to your follow-up appointment?: No Do you understand care options if your condition(s) worsen?: Yes-patient verbalized understanding    SIGNATURE Germain Kohler, CMA (AAMA)  [facility] [phone]

## 2023-08-09 ENCOUNTER — Other Ambulatory Visit: Payer: Self-pay

## 2023-08-10 DIAGNOSIS — I5032 Chronic diastolic (congestive) heart failure: Secondary | ICD-10-CM | POA: Diagnosis not present

## 2023-08-10 DIAGNOSIS — G2581 Restless legs syndrome: Secondary | ICD-10-CM | POA: Diagnosis not present

## 2023-08-10 DIAGNOSIS — Z85528 Personal history of other malignant neoplasm of kidney: Secondary | ICD-10-CM | POA: Diagnosis not present

## 2023-08-10 DIAGNOSIS — E876 Hypokalemia: Secondary | ICD-10-CM | POA: Diagnosis not present

## 2023-08-10 DIAGNOSIS — G4700 Insomnia, unspecified: Secondary | ICD-10-CM | POA: Diagnosis not present

## 2023-08-10 DIAGNOSIS — E039 Hypothyroidism, unspecified: Secondary | ICD-10-CM | POA: Diagnosis not present

## 2023-08-10 DIAGNOSIS — M159 Polyosteoarthritis, unspecified: Secondary | ICD-10-CM | POA: Diagnosis not present

## 2023-08-10 DIAGNOSIS — K219 Gastro-esophageal reflux disease without esophagitis: Secondary | ICD-10-CM | POA: Diagnosis not present

## 2023-08-10 DIAGNOSIS — Z85828 Personal history of other malignant neoplasm of skin: Secondary | ICD-10-CM | POA: Diagnosis not present

## 2023-08-10 DIAGNOSIS — E7800 Pure hypercholesterolemia, unspecified: Secondary | ICD-10-CM | POA: Diagnosis not present

## 2023-08-10 DIAGNOSIS — Z556 Problems related to health literacy: Secondary | ICD-10-CM | POA: Diagnosis not present

## 2023-08-10 DIAGNOSIS — Z87891 Personal history of nicotine dependence: Secondary | ICD-10-CM | POA: Diagnosis not present

## 2023-08-10 DIAGNOSIS — I110 Hypertensive heart disease with heart failure: Secondary | ICD-10-CM | POA: Diagnosis not present

## 2023-08-12 ENCOUNTER — Other Ambulatory Visit: Payer: Self-pay | Admitting: Family Medicine

## 2023-08-14 DIAGNOSIS — Z85528 Personal history of other malignant neoplasm of kidney: Secondary | ICD-10-CM | POA: Diagnosis not present

## 2023-08-14 DIAGNOSIS — G4700 Insomnia, unspecified: Secondary | ICD-10-CM | POA: Diagnosis not present

## 2023-08-14 DIAGNOSIS — Z87891 Personal history of nicotine dependence: Secondary | ICD-10-CM | POA: Diagnosis not present

## 2023-08-14 DIAGNOSIS — K219 Gastro-esophageal reflux disease without esophagitis: Secondary | ICD-10-CM | POA: Diagnosis not present

## 2023-08-14 DIAGNOSIS — I110 Hypertensive heart disease with heart failure: Secondary | ICD-10-CM | POA: Diagnosis not present

## 2023-08-14 DIAGNOSIS — E876 Hypokalemia: Secondary | ICD-10-CM | POA: Diagnosis not present

## 2023-08-14 DIAGNOSIS — E039 Hypothyroidism, unspecified: Secondary | ICD-10-CM | POA: Diagnosis not present

## 2023-08-14 DIAGNOSIS — G2581 Restless legs syndrome: Secondary | ICD-10-CM | POA: Diagnosis not present

## 2023-08-14 DIAGNOSIS — Z556 Problems related to health literacy: Secondary | ICD-10-CM | POA: Diagnosis not present

## 2023-08-14 DIAGNOSIS — Z85828 Personal history of other malignant neoplasm of skin: Secondary | ICD-10-CM | POA: Diagnosis not present

## 2023-08-14 DIAGNOSIS — I5032 Chronic diastolic (congestive) heart failure: Secondary | ICD-10-CM | POA: Diagnosis not present

## 2023-08-14 DIAGNOSIS — M159 Polyosteoarthritis, unspecified: Secondary | ICD-10-CM | POA: Diagnosis not present

## 2023-08-14 DIAGNOSIS — E7800 Pure hypercholesterolemia, unspecified: Secondary | ICD-10-CM | POA: Diagnosis not present

## 2023-08-16 DIAGNOSIS — Z556 Problems related to health literacy: Secondary | ICD-10-CM | POA: Diagnosis not present

## 2023-08-16 DIAGNOSIS — E7800 Pure hypercholesterolemia, unspecified: Secondary | ICD-10-CM | POA: Diagnosis not present

## 2023-08-16 DIAGNOSIS — I110 Hypertensive heart disease with heart failure: Secondary | ICD-10-CM | POA: Diagnosis not present

## 2023-08-16 DIAGNOSIS — M159 Polyosteoarthritis, unspecified: Secondary | ICD-10-CM | POA: Diagnosis not present

## 2023-08-16 DIAGNOSIS — Z85528 Personal history of other malignant neoplasm of kidney: Secondary | ICD-10-CM | POA: Diagnosis not present

## 2023-08-16 DIAGNOSIS — Z85828 Personal history of other malignant neoplasm of skin: Secondary | ICD-10-CM | POA: Diagnosis not present

## 2023-08-16 DIAGNOSIS — G4700 Insomnia, unspecified: Secondary | ICD-10-CM | POA: Diagnosis not present

## 2023-08-16 DIAGNOSIS — E876 Hypokalemia: Secondary | ICD-10-CM | POA: Diagnosis not present

## 2023-08-16 DIAGNOSIS — K219 Gastro-esophageal reflux disease without esophagitis: Secondary | ICD-10-CM | POA: Diagnosis not present

## 2023-08-16 DIAGNOSIS — I5032 Chronic diastolic (congestive) heart failure: Secondary | ICD-10-CM | POA: Diagnosis not present

## 2023-08-16 DIAGNOSIS — G2581 Restless legs syndrome: Secondary | ICD-10-CM | POA: Diagnosis not present

## 2023-08-16 DIAGNOSIS — Z87891 Personal history of nicotine dependence: Secondary | ICD-10-CM | POA: Diagnosis not present

## 2023-08-16 DIAGNOSIS — E039 Hypothyroidism, unspecified: Secondary | ICD-10-CM | POA: Diagnosis not present

## 2023-08-17 ENCOUNTER — Ambulatory Visit: Admitting: Family Medicine

## 2023-08-17 ENCOUNTER — Encounter: Payer: Self-pay | Admitting: Family Medicine

## 2023-08-17 VITALS — BP 120/76 | HR 79 | Temp 98.3°F | Ht 64.0 in | Wt 146.0 lb

## 2023-08-17 DIAGNOSIS — E871 Hypo-osmolality and hyponatremia: Secondary | ICD-10-CM | POA: Diagnosis not present

## 2023-08-17 DIAGNOSIS — K219 Gastro-esophageal reflux disease without esophagitis: Secondary | ICD-10-CM | POA: Diagnosis not present

## 2023-08-17 DIAGNOSIS — Z85828 Personal history of other malignant neoplasm of skin: Secondary | ICD-10-CM | POA: Diagnosis not present

## 2023-08-17 DIAGNOSIS — M792 Neuralgia and neuritis, unspecified: Secondary | ICD-10-CM

## 2023-08-17 DIAGNOSIS — E7800 Pure hypercholesterolemia, unspecified: Secondary | ICD-10-CM | POA: Diagnosis not present

## 2023-08-17 DIAGNOSIS — E876 Hypokalemia: Secondary | ICD-10-CM | POA: Diagnosis not present

## 2023-08-17 DIAGNOSIS — Z556 Problems related to health literacy: Secondary | ICD-10-CM | POA: Diagnosis not present

## 2023-08-17 DIAGNOSIS — I5032 Chronic diastolic (congestive) heart failure: Secondary | ICD-10-CM | POA: Diagnosis not present

## 2023-08-17 DIAGNOSIS — G4700 Insomnia, unspecified: Secondary | ICD-10-CM | POA: Diagnosis not present

## 2023-08-17 DIAGNOSIS — M159 Polyosteoarthritis, unspecified: Secondary | ICD-10-CM | POA: Diagnosis not present

## 2023-08-17 DIAGNOSIS — R630 Anorexia: Secondary | ICD-10-CM | POA: Diagnosis not present

## 2023-08-17 DIAGNOSIS — Z85528 Personal history of other malignant neoplasm of kidney: Secondary | ICD-10-CM | POA: Diagnosis not present

## 2023-08-17 DIAGNOSIS — E039 Hypothyroidism, unspecified: Secondary | ICD-10-CM | POA: Diagnosis not present

## 2023-08-17 DIAGNOSIS — G2581 Restless legs syndrome: Secondary | ICD-10-CM | POA: Diagnosis not present

## 2023-08-17 DIAGNOSIS — Z87891 Personal history of nicotine dependence: Secondary | ICD-10-CM | POA: Diagnosis not present

## 2023-08-17 DIAGNOSIS — I110 Hypertensive heart disease with heart failure: Secondary | ICD-10-CM | POA: Diagnosis not present

## 2023-08-17 MED ORDER — MIRTAZAPINE 15 MG PO TABS
15.0000 mg | ORAL_TABLET | Freq: Every day | ORAL | 3 refills | Status: AC
Start: 2023-08-17 — End: ?

## 2023-08-17 MED ORDER — ESCITALOPRAM OXALATE 10 MG PO TABS
10.0000 mg | ORAL_TABLET | Freq: Every morning | ORAL | 3 refills | Status: AC
Start: 2023-08-17 — End: ?

## 2023-08-17 NOTE — Progress Notes (Signed)
 Subjective:    Patient ID: Patricia Morton, female    DOB: [birth year], 89 y.o.   MRN: [id number]  HPI Hospitalist Discharge Summary  Discharge date: Aug 05, 2023 Length of stay: LOS: 2 days  Discharge Service: [facility] Hospitalists Discharge Attending Physician: Patricia Dessert, FNP Discharge to: To Home with Home Health Condition at Discharge: good  Mental Status On day of Discharge:  The patient is Alert and oriented to PERSON The patient is Alert And oriented to TIME The patient is Alert and oriented to [facility] course based on timeline of significant events after admission (by date): 08/03/23 Admitted with hyponatremia, 119. Hypokalemia 2.5. Admitted to ICU for frequent labs and monitoring until sodium is closer to normal.   08/04/23 Sodium up to 128, potassium 3.6. Continue IVF hydration, changed to acute routine care. Family updated.  ______________________________________  Admission HPI   Patient admitted on: 08/03/2023 4:07 PM  Patient admitted by: Patricia Dessert, FNP  CHIEF COMPLAINT: Dizziness, lightheaded with blurry vision for a few days  Day of admission HPI: Patient presents to the ED today with dizziness, lightheadedness, blurry vision for a few days that is getting worse. She saw her PCP Dr. Cheril Morton on Monday and he added potassium to her daily medication regimen, cut her hydrochlorothiazide  dosage in half to 12.5 mg daily. States she has been having some swelling in her lower extremities and she stopped taking her Lasix  but is unsure when she stopped. No recent falls. Lives alone at home, walks with a cane at baseline. Family checks on her frequently. Her son Patricia Morton and his wife Patricia Morton are at the bedside today. Patient also has a daughter, Patricia Morton. Other recent medication changes are that patient stopped her gabapentin  that she takes for neuropathy, she does not like the way it makes her feel.  Past medical history includes hypertension, diastolic heart  failure, thyroid  disease, anxiety, hyperlipidemia, GERD.  Sodium 119 today, potassium 2.5. Vital signs are stable.  Patient admitted on Home O2? - no Patient on home anticoagulant? - no Patient admitted with Chronic home foley catheter? - no Foley catheter placed or replaced by another service prior to admission? - no  Mental Status on Admission: The patient is Alert and oriented to PERSON The patient is Alert And oriented to TIME The patient is Alert and oriented to LOCATION  Today: Pt is laying in bed awake and alert. Daughter Patricia Morton at bedside. She slept so well and feels much better. Lightheadedness, dizziness resolved.   Problem List, Assessment & Plan   ASSESSMENT & PLAN (In order of descending acuity)  Hyponatremia Sodium 119 on admission, up to 139 this morning Was slowly hydrated with normal saline Secondary to incorrect diuretic use, held while admitted PCP stopped Lasix , started hydrochlorothiazide  and then cut the dosage in half; will resume this at discharge tomorrow morning  Hypokalemia Potassium 2.5 on admission, up to 3.2 this morning; have labs rechecked Tuesday 5/27 Takes a 20meq daily supplement at home; supplement an additional 40meq before discharge today  Age related physical debility PT and OT have recommended HHPT  Diastolic HF Echo 09/8467 shows G2XB, normal EF 60-65% Takes metoprolol  at home; recently stopped taking Lasix  and started hydrochlorothiazide  but her PCP cut the dosage of hydrochlorothiazide  in half Holding both currently due to dehydration and electrolyte abnormalities  Thyroid  disease TSH 5.759, free T4 1.40; continue current dosage of levothyroxine      08/17/23 At the patient's last office visit, we discontinued amlodipine  and  Lasix  and try to replace them with hydrochlorothiazide .  Accidentally the patient began taking Lasix  and hydrochlorothiazide  which led to crown dehydration and hyponatremia and hypokalemia.  Patient was admitted to  the hospital as stated above.  Since discharge from the hospital she has stopped Lasix  and hydrochlorothiazide .  We are holding gabapentin  that she was taking pathic pain.  She is only taking metoprolol  and amlodipine .  Her blood pressure is fine.  The dizziness has resolved.  However her daughter states that she is dealing with depression.  She has very little appetite.  She is losing weight she is not eating.  This stems from having to leave her home and depression about the change in her life.  She misses her independence.  She is taking Lexapro . Past Medical History:  Diagnosis Date   Anxiety    Arthritis    Cataract    surgically removed   Hyperlipemia    Hypertension    Hypothyroid    Urticaria    Past Surgical History:  Procedure Laterality Date   ABDOMINAL HYSTERECTOMY     BREAST SURGERY     right breast -lumpectomy-benign   CYSTECTOMY     EYE SURGERY     HAND SURGERY Right 02/2019   TOTAL KNEE ARTHROPLASTY Right 05/25/2015   Procedure: TOTAL KNEE ARTHROPLASTY;  Surgeon: Patricia Rei, MD;  Location: [facility];  Service: Orthopedics;  Laterality: Right;   TOTAL KNEE ARTHROPLASTY Left 12/14/2015   Procedure: LEFT TOTAL KNEE ARTHROPLASTY;  Surgeon: Patricia Rei, MD;  Location: [facility];  Service: Orthopedics;  Laterality: Left;   Current Outpatient Medications on File Prior to Visit  Medication Sig Dispense Refill   amLODipine  (NORVASC ) 10 MG tablet TAKE 1 TABLET BY MOUTH EVERY DAY 90 tablet 3   calcium carbonate (OSCAL) 1500 (600 Ca) MG TABS tablet Take by mouth 2 (two) times daily with a meal.     cetirizine  (ZYRTEC ) 10 MG tablet TAKE 1 TABLET BY MOUTH EVERY DAY 90 tablet 1   EPINEPHrine  0.3 mg/0.3 mL IJ SOAJ injection Inject 0.3 mLs (0.3 mg total) into the muscle as needed for anaphylaxis. 1 Device 1   escitalopram  (LEXAPRO ) 10 MG tablet TAKE 1 TABLET BY MOUTH EVERY DAY 90 tablet 1   gabapentin  (NEURONTIN ) 100 MG capsule Take 1 capsule (100 mg total) by mouth 3 (three) times daily  as needed (nerve pain). 90 capsule 3   levothyroxine  (SYNTHROID ) 25 MCG tablet TAKE 1 TABLET BY MOUTH EVERY DAY BEFORE breakfast, take in addition TO 50 mcg TO equal 75mcg 90 tablet 1   levothyroxine  (SYNTHROID ) 50 MCG tablet TAKE 1 TABLET BY MOUTH DAILY 90 tablet 1   metoprolol  tartrate (LOPRESSOR ) 50 MG tablet TAKE 1 TABLET BY MOUTH TWICE DAILY FOR BLOOD PRESSURE 180 tablet 1   neomycin -bacitracin -polymyxin (NEOSPORIN) ophthalmic ointment Place 1 Application into the right eye 4 (four) times daily. 3.5 g 0   pantoprazole  (PROTONIX ) 40 MG tablet Take 1 tablet (40 mg total) by mouth daily. 90 tablet 3   potassium chloride  SA (KLOR-CON  M) 20 MEQ tablet Take 1 tablet (20 mEq total) by mouth daily. 90 tablet 0   simvastatin  (ZOCOR ) 40 MG tablet TAKE 1 TABLET BY MOUTH AT BEDTIME 90 tablet 1   Current Facility-Administered Medications on File Prior to Visit  Medication Dose Route Frequency Provider Last Rate Last Admin   omalizumab  (XOLAIR ) injection 150 mg  150 mg Subcutaneous Q28 days Trudy Fusi, DO   150 mg at 09/09/20 1019  Allergies  Allergen Reactions   Celexa [Citalopram Hydrobromide] Hives   Social History   Socioeconomic History   Marital status: Widowed    Spouse name: Not on file   Number of children: 2   Years of education: Not on file   Highest education level: Not on file  Occupational History   Not on file  Tobacco Use   Smoking status: Former    Current packs/day: 0.00    Types: Cigarettes    Start date: 71    Quit date: 2004    Years since quitting: 21.4   Smokeless tobacco: Never  Vaping Use   Vaping status: Never Used  Substance and Sexual Activity   Alcohol use: Not Currently    Comment: wine occassionally   Drug use: No   Sexual activity: Not Currently  Other Topics Concern   Not on file  Social History Narrative   Daughter is primary caregiver    Worked at [employer] as Conservation officer, nature    Social Drivers of Health   Financial Resource Strain: Low  Risk  (08/04/2023)   Received from [facility]   Overall Financial Resource Strain (CARDIA)    Difficulty of Paying Living Expenses: Not hard at all  Food Insecurity: No Food Insecurity (08/04/2023)   Received from [facility]   Hunger Vital Sign    Worried About Running Out of Food in the Last Year: Never true    Ran Out of Food in the Last Year: Never true  Transportation Needs: No Transportation Needs (08/04/2023)   Received from [facility]   PRAPARE - Transportation    Lack of Transportation (Medical): No    Lack of Transportation (Non-Medical): No  Physical Activity: Inactive (08/04/2023)   Received from [facility]   Exercise Vital Sign    Days of Exercise per Week: 0 days    Minutes of Exercise per Session: 0 min  Stress: No Stress Concern Present (08/04/2023)   Received from [facility] of Occupational Health - Occupational Stress Questionnaire    Feeling of Stress : Not at all  Social Connections: Moderately Isolated (08/04/2023)   Received from [facility]   Social Connection and Isolation Panel [NHANES]    Frequency of Communication with Friends and Family: Once a week    Frequency of Social Gatherings with Friends and Family: Once a week    Attends Religious Services: 1 to 4 times per year    Active Member of [employer] or Organizations: Yes    Attends Banker Meetings: 1 to 4 times per year    Marital Status: Widowed  Intimate Partner Violence: Not At Risk (08/04/2023)   Received from [facility]   Humiliation, Afraid, Rape, and Kick questionnaire    Fear of Current or Ex-Partner: No    Emotionally Abused: No    Physically Abused: No    Sexually Abused: No     Review of Systems  All other systems reviewed and are negative.      Objective:   Physical Exam Vitals reviewed.  Constitutional:      Appearance: Normal appearance.  Cardiovascular:     Rate and Rhythm: Normal rate and regular rhythm.     Heart  sounds: Normal heart sounds. No murmur heard.    No friction rub. No gallop.  Pulmonary:     Effort: Pulmonary effort is normal. No respiratory distress.     Breath sounds: Normal breath  sounds. No wheezing, rhonchi or rales.  Abdominal:     General: Bowel sounds are normal. There is no distension.     Palpations: Abdomen is soft.     Tenderness: There is no abdominal tenderness. There is no guarding or rebound.  Musculoskeletal:     Left shoulder: Tenderness and crepitus present. Decreased range of motion.     Right lower leg: No edema.     Left lower leg: No edema.  Neurological:     Mental Status: She is alert.          Assessment & Plan:  Hyponatremia - Plan: CBC with Differential/Platelet, Basic Metabolic Panel Without GFR  Hypokalemia  Poor appetite  Neuropathic pain Patient is not sleeping well.  She is not eating well.  She has poor appetite.  She is dealing with depression and adjustment having to leave her home.  Recently she was admitted with profound hypokalemia and hyponatremia due to diuretic use.  Recheck CBC and BMP today.  If sodium and potassium levels are normal, no further workup or treatment is required.  Continue metoprolol  and amlodipine  for blood pressure.  Use pressure hose to manage swelling leg rather than diuretic.  Regarding the neuropathic pain in her feet, the patient can now resume gabapentin  100 mg at night.  Slowly increase the medication to 3 times a day if beneficial and if she tolerates it.  I discussed this at length with her daughter.  They are to watch for sedation and dizziness.  Hopefully this will help with her sleeping at night as well.  After she has adjusted the gabapentin , if she still is having trouble sleeping and demonstrating poor appetite they can add Remeron 15 mg a day as an appetite stimulant and to help her sleep at night

## 2023-08-18 ENCOUNTER — Ambulatory Visit: Payer: Self-pay | Admitting: Family Medicine

## 2023-08-18 DIAGNOSIS — G2581 Restless legs syndrome: Secondary | ICD-10-CM | POA: Diagnosis not present

## 2023-08-18 DIAGNOSIS — K219 Gastro-esophageal reflux disease without esophagitis: Secondary | ICD-10-CM | POA: Diagnosis not present

## 2023-08-18 DIAGNOSIS — M159 Polyosteoarthritis, unspecified: Secondary | ICD-10-CM | POA: Diagnosis not present

## 2023-08-18 DIAGNOSIS — Z85528 Personal history of other malignant neoplasm of kidney: Secondary | ICD-10-CM | POA: Diagnosis not present

## 2023-08-18 DIAGNOSIS — I5032 Chronic diastolic (congestive) heart failure: Secondary | ICD-10-CM | POA: Diagnosis not present

## 2023-08-18 DIAGNOSIS — G4700 Insomnia, unspecified: Secondary | ICD-10-CM | POA: Diagnosis not present

## 2023-08-18 DIAGNOSIS — Z85828 Personal history of other malignant neoplasm of skin: Secondary | ICD-10-CM | POA: Diagnosis not present

## 2023-08-18 DIAGNOSIS — E039 Hypothyroidism, unspecified: Secondary | ICD-10-CM | POA: Diagnosis not present

## 2023-08-18 DIAGNOSIS — I110 Hypertensive heart disease with heart failure: Secondary | ICD-10-CM | POA: Diagnosis not present

## 2023-08-18 DIAGNOSIS — E7800 Pure hypercholesterolemia, unspecified: Secondary | ICD-10-CM | POA: Diagnosis not present

## 2023-08-18 DIAGNOSIS — E876 Hypokalemia: Secondary | ICD-10-CM | POA: Diagnosis not present

## 2023-08-18 DIAGNOSIS — Z556 Problems related to health literacy: Secondary | ICD-10-CM | POA: Diagnosis not present

## 2023-08-18 DIAGNOSIS — Z87891 Personal history of nicotine dependence: Secondary | ICD-10-CM | POA: Diagnosis not present

## 2023-08-18 LAB — BASIC METABOLIC PANEL WITHOUT GFR
BUN: 14 mg/dL (ref 7–25)
CO2: 23 mmol/L (ref 20–32)
Calcium: 9.8 mg/dL (ref 8.6–10.4)
Chloride: 100 mmol/L (ref 98–110)
Creat: 0.77 mg/dL (ref 0.60–0.95)
Glucose, Bld: 92 mg/dL (ref 65–99)
Potassium: 4.9 mmol/L (ref 3.5–5.3)
Sodium: 134 mmol/L — ABNORMAL LOW (ref 135–146)

## 2023-08-18 LAB — CBC WITH DIFFERENTIAL/PLATELET
Absolute Lymphocytes: 1611 {cells}/uL (ref 850–3900)
Absolute Monocytes: 630 {cells}/uL (ref 200–950)
Basophils Absolute: 0 {cells}/uL (ref 0–200)
Basophils Relative: 0 %
Eosinophils Absolute: 72 {cells}/uL (ref 15–500)
Eosinophils Relative: 0.8 %
HCT: 45 % (ref 35.0–45.0)
Hemoglobin: 14.9 g/dL (ref 11.7–15.5)
MCH: 31.2 pg (ref 27.0–33.0)
MCHC: 33.1 g/dL (ref 32.0–36.0)
MCV: 94.1 fL (ref 80.0–100.0)
MPV: 9.1 fL (ref 7.5–12.5)
Monocytes Relative: 7 %
Neutro Abs: 6687 {cells}/uL (ref 1500–7800)
Neutrophils Relative %: 74.3 %
Platelets: 543 10*3/uL — ABNORMAL HIGH (ref 140–400)
RBC: 4.78 10*6/uL (ref 3.80–5.10)
RDW: 13.9 % (ref 11.0–15.0)
Total Lymphocyte: 17.9 %
WBC: 9 10*3/uL (ref 3.8–10.8)

## 2023-08-21 DIAGNOSIS — Z85828 Personal history of other malignant neoplasm of skin: Secondary | ICD-10-CM | POA: Diagnosis not present

## 2023-08-21 DIAGNOSIS — K219 Gastro-esophageal reflux disease without esophagitis: Secondary | ICD-10-CM | POA: Diagnosis not present

## 2023-08-21 DIAGNOSIS — E876 Hypokalemia: Secondary | ICD-10-CM | POA: Diagnosis not present

## 2023-08-21 DIAGNOSIS — Z85528 Personal history of other malignant neoplasm of kidney: Secondary | ICD-10-CM | POA: Diagnosis not present

## 2023-08-21 DIAGNOSIS — G4700 Insomnia, unspecified: Secondary | ICD-10-CM | POA: Diagnosis not present

## 2023-08-21 DIAGNOSIS — Z87891 Personal history of nicotine dependence: Secondary | ICD-10-CM | POA: Diagnosis not present

## 2023-08-21 DIAGNOSIS — I110 Hypertensive heart disease with heart failure: Secondary | ICD-10-CM | POA: Diagnosis not present

## 2023-08-21 DIAGNOSIS — G2581 Restless legs syndrome: Secondary | ICD-10-CM | POA: Diagnosis not present

## 2023-08-21 DIAGNOSIS — E7800 Pure hypercholesterolemia, unspecified: Secondary | ICD-10-CM | POA: Diagnosis not present

## 2023-08-21 DIAGNOSIS — I5032 Chronic diastolic (congestive) heart failure: Secondary | ICD-10-CM | POA: Diagnosis not present

## 2023-08-21 DIAGNOSIS — Z556 Problems related to health literacy: Secondary | ICD-10-CM | POA: Diagnosis not present

## 2023-08-21 DIAGNOSIS — M159 Polyosteoarthritis, unspecified: Secondary | ICD-10-CM | POA: Diagnosis not present

## 2023-08-21 DIAGNOSIS — E039 Hypothyroidism, unspecified: Secondary | ICD-10-CM | POA: Diagnosis not present

## 2023-08-24 DIAGNOSIS — Z87891 Personal history of nicotine dependence: Secondary | ICD-10-CM | POA: Diagnosis not present

## 2023-08-24 DIAGNOSIS — Z85828 Personal history of other malignant neoplasm of skin: Secondary | ICD-10-CM | POA: Diagnosis not present

## 2023-08-24 DIAGNOSIS — E039 Hypothyroidism, unspecified: Secondary | ICD-10-CM | POA: Diagnosis not present

## 2023-08-24 DIAGNOSIS — E7800 Pure hypercholesterolemia, unspecified: Secondary | ICD-10-CM | POA: Diagnosis not present

## 2023-08-24 DIAGNOSIS — M159 Polyosteoarthritis, unspecified: Secondary | ICD-10-CM | POA: Diagnosis not present

## 2023-08-24 DIAGNOSIS — G4700 Insomnia, unspecified: Secondary | ICD-10-CM | POA: Diagnosis not present

## 2023-08-24 DIAGNOSIS — G2581 Restless legs syndrome: Secondary | ICD-10-CM | POA: Diagnosis not present

## 2023-08-24 DIAGNOSIS — E876 Hypokalemia: Secondary | ICD-10-CM | POA: Diagnosis not present

## 2023-08-24 DIAGNOSIS — Z556 Problems related to health literacy: Secondary | ICD-10-CM | POA: Diagnosis not present

## 2023-08-24 DIAGNOSIS — K219 Gastro-esophageal reflux disease without esophagitis: Secondary | ICD-10-CM | POA: Diagnosis not present

## 2023-08-24 DIAGNOSIS — I110 Hypertensive heart disease with heart failure: Secondary | ICD-10-CM | POA: Diagnosis not present

## 2023-08-24 DIAGNOSIS — Z85528 Personal history of other malignant neoplasm of kidney: Secondary | ICD-10-CM | POA: Diagnosis not present

## 2023-08-24 DIAGNOSIS — I5032 Chronic diastolic (congestive) heart failure: Secondary | ICD-10-CM | POA: Diagnosis not present

## 2023-08-25 DIAGNOSIS — E876 Hypokalemia: Secondary | ICD-10-CM | POA: Diagnosis not present

## 2023-08-25 DIAGNOSIS — M159 Polyosteoarthritis, unspecified: Secondary | ICD-10-CM | POA: Diagnosis not present

## 2023-08-25 DIAGNOSIS — I5032 Chronic diastolic (congestive) heart failure: Secondary | ICD-10-CM | POA: Diagnosis not present

## 2023-08-25 DIAGNOSIS — K219 Gastro-esophageal reflux disease without esophagitis: Secondary | ICD-10-CM | POA: Diagnosis not present

## 2023-08-25 DIAGNOSIS — G2581 Restless legs syndrome: Secondary | ICD-10-CM | POA: Diagnosis not present

## 2023-08-25 DIAGNOSIS — Z87891 Personal history of nicotine dependence: Secondary | ICD-10-CM | POA: Diagnosis not present

## 2023-08-25 DIAGNOSIS — E039 Hypothyroidism, unspecified: Secondary | ICD-10-CM | POA: Diagnosis not present

## 2023-08-25 DIAGNOSIS — F32A Depression, unspecified: Secondary | ICD-10-CM

## 2023-08-25 DIAGNOSIS — G4700 Insomnia, unspecified: Secondary | ICD-10-CM | POA: Diagnosis not present

## 2023-08-25 DIAGNOSIS — F419 Anxiety disorder, unspecified: Secondary | ICD-10-CM

## 2023-08-25 DIAGNOSIS — E7800 Pure hypercholesterolemia, unspecified: Secondary | ICD-10-CM | POA: Diagnosis not present

## 2023-08-25 DIAGNOSIS — I110 Hypertensive heart disease with heart failure: Secondary | ICD-10-CM | POA: Diagnosis not present

## 2023-09-06 DIAGNOSIS — K219 Gastro-esophageal reflux disease without esophagitis: Secondary | ICD-10-CM | POA: Diagnosis not present

## 2023-09-06 DIAGNOSIS — Z556 Problems related to health literacy: Secondary | ICD-10-CM | POA: Diagnosis not present

## 2023-09-06 DIAGNOSIS — G2581 Restless legs syndrome: Secondary | ICD-10-CM | POA: Diagnosis not present

## 2023-09-06 DIAGNOSIS — E876 Hypokalemia: Secondary | ICD-10-CM | POA: Diagnosis not present

## 2023-09-06 DIAGNOSIS — Z85828 Personal history of other malignant neoplasm of skin: Secondary | ICD-10-CM | POA: Diagnosis not present

## 2023-09-06 DIAGNOSIS — G4700 Insomnia, unspecified: Secondary | ICD-10-CM | POA: Diagnosis not present

## 2023-09-06 DIAGNOSIS — I110 Hypertensive heart disease with heart failure: Secondary | ICD-10-CM | POA: Diagnosis not present

## 2023-09-06 DIAGNOSIS — M159 Polyosteoarthritis, unspecified: Secondary | ICD-10-CM | POA: Diagnosis not present

## 2023-09-06 DIAGNOSIS — E7800 Pure hypercholesterolemia, unspecified: Secondary | ICD-10-CM | POA: Diagnosis not present

## 2023-09-06 DIAGNOSIS — Z87891 Personal history of nicotine dependence: Secondary | ICD-10-CM | POA: Diagnosis not present

## 2023-09-06 DIAGNOSIS — I5032 Chronic diastolic (congestive) heart failure: Secondary | ICD-10-CM | POA: Diagnosis not present

## 2023-09-06 DIAGNOSIS — Z85528 Personal history of other malignant neoplasm of kidney: Secondary | ICD-10-CM | POA: Diagnosis not present

## 2023-09-06 DIAGNOSIS — E039 Hypothyroidism, unspecified: Secondary | ICD-10-CM | POA: Diagnosis not present

## 2023-09-12 DIAGNOSIS — H40011 Open angle with borderline findings, low risk, right eye: Secondary | ICD-10-CM | POA: Diagnosis not present

## 2023-10-11 ENCOUNTER — Other Ambulatory Visit: Payer: Self-pay | Admitting: Family Medicine

## 2023-10-20 ENCOUNTER — Other Ambulatory Visit: Payer: Self-pay | Admitting: Family Medicine

## 2023-10-20 ENCOUNTER — Other Ambulatory Visit: Payer: Self-pay

## 2023-10-20 MED ORDER — LEVOTHYROXINE SODIUM 25 MCG PO TABS: ORAL_TABLET | ORAL | 1 refills | Status: DC

## 2023-10-23 ENCOUNTER — Other Ambulatory Visit: Payer: Self-pay | Admitting: Family Medicine

## 2023-10-24 ENCOUNTER — Telehealth: Payer: Self-pay

## 2023-10-24 ENCOUNTER — Other Ambulatory Visit: Payer: Self-pay

## 2023-10-24 DIAGNOSIS — M792 Neuralgia and neuritis, unspecified: Secondary | ICD-10-CM

## 2023-10-24 DIAGNOSIS — E039 Hypothyroidism, unspecified: Secondary | ICD-10-CM

## 2023-10-24 MED ORDER — LEVOTHYROXINE SODIUM 50 MCG PO TABS: 50.0000 ug | ORAL_TABLET | Freq: Every day | ORAL | 1 refills | Status: AC

## 2023-10-24 MED ORDER — GABAPENTIN 100 MG PO CAPS: 100.0000 mg | ORAL_CAPSULE | Freq: Three times a day (TID) | ORAL | 1 refills | Status: DC | PRN

## 2023-10-24 NOTE — Telephone Encounter (Signed)
 Copied from CRM [id number]. Topic: Clinical - Medication Question >> Oct 23, 2023  4:56 PM Winona R wrote:  Cat from [facility]. Calling to check the status of pt med refill for  gabapentin  (NEURONTIN ) 100 MG capsule and levothyroxine  (SYNTHROID ) 50 MCG tablet. Cat stated they need to get this out to her by Thursday and need to receive the refills by Wednesday    [place], [place] - [address] [place] [address] Phone: [phone] Fax: [phone]

## 2023-11-20 ENCOUNTER — Other Ambulatory Visit: Payer: Self-pay

## 2023-11-20 ENCOUNTER — Other Ambulatory Visit: Payer: Self-pay | Admitting: Family Medicine

## 2023-11-20 MED ORDER — CETIRIZINE HCL 10 MG PO TABS: 10.0000 mg | ORAL_TABLET | Freq: Every day | ORAL | 1 refills | Status: AC

## 2023-11-20 MED ORDER — METOPROLOL TARTRATE 50 MG PO TABS: 50.0000 mg | ORAL_TABLET | Freq: Two times a day (BID) | ORAL | 1 refills | Status: AC

## 2023-11-21 ENCOUNTER — Other Ambulatory Visit: Payer: Self-pay | Admitting: Family Medicine

## 2023-12-19 ENCOUNTER — Other Ambulatory Visit: Payer: Self-pay | Admitting: Family Medicine

## 2023-12-19 DIAGNOSIS — M792 Neuralgia and neuritis, unspecified: Secondary | ICD-10-CM

## 2024-02-08 ENCOUNTER — Other Ambulatory Visit: Payer: Self-pay | Admitting: Family Medicine

## 2024-02-12 ENCOUNTER — Encounter: Payer: Self-pay | Admitting: Family Medicine

## 2024-02-12 ENCOUNTER — Ambulatory Visit: Admitting: Family Medicine

## 2024-02-12 VITALS — BP 126/88 | HR 64 | Temp 97.9°F | Ht 64.0 in | Wt 158.8 lb

## 2024-02-12 DIAGNOSIS — M7989 Other specified soft tissue disorders: Secondary | ICD-10-CM

## 2024-02-12 DIAGNOSIS — E039 Hypothyroidism, unspecified: Secondary | ICD-10-CM

## 2024-02-12 MED ORDER — HYDROCHLOROTHIAZIDE 12.5 MG PO TABS: 12.5000 mg | ORAL_TABLET | Freq: Every day | ORAL | 3 refills | Status: AC

## 2024-02-12 MED ORDER — LEVOTHYROXINE SODIUM 25 MCG PO TABS: ORAL_TABLET | ORAL | 1 refills | Status: AC

## 2024-02-12 NOTE — Progress Notes (Signed)
 Wt Readings from Last 3 Encounters:  02/12/24 158 lb 12.8 oz (72 kg)  08/17/23 146 lb (66.2 kg)  07/31/23 146 lb 2 oz (66.3 kg)     Subjective:    Patient ID: Patricia Morton, female    DOB: 22-Dec-1926, 89 y.o.   MRN: [id number]  HPI  Patient is very concerned over swelling in her legs.  She reports +1 edema from her ankles to her knees.  She denies any chest pain or shortness of breath or dyspnea on exertion.  She is on amlodipine  10 mg a day. Past Medical History:  Diagnosis Date   Anxiety    Arthritis    Cataract    surgically removed   Hyperlipemia    Hypertension    Hypothyroid    Urticaria    Past Surgical History:  Procedure Laterality Date   ABDOMINAL HYSTERECTOMY     BREAST SURGERY     right breast -lumpectomy-benign   CYSTECTOMY     EYE SURGERY     HAND SURGERY Right 02/2019   TOTAL KNEE ARTHROPLASTY Right 05/25/2015   Procedure: TOTAL KNEE ARTHROPLASTY;  Surgeon: Dempsey Moan, MD;  Location: [facility];  Service: Orthopedics;  Laterality: Right;   TOTAL KNEE ARTHROPLASTY Left 12/14/2015   Procedure: LEFT TOTAL KNEE ARTHROPLASTY;  Surgeon: Dempsey Moan, MD;  Location: [facility];  Service: Orthopedics;  Laterality: Left;   Current Outpatient Medications on File Prior to Visit  Medication Sig Dispense Refill   amLODipine  (NORVASC ) 10 MG tablet TAKE 1 TABLET BY MOUTH EVERY DAY 90 tablet 3   calcium carbonate (OSCAL) 1500 (600 Ca) MG TABS tablet Take by mouth 2 (two) times daily with a meal.     cetirizine  (ZYRTEC ) 10 MG tablet Take 1 tablet (10 mg total) by mouth daily. 90 tablet 1   EPINEPHrine  0.3 mg/0.3 mL IJ SOAJ injection Inject 0.3 mLs (0.3 mg total) into the muscle as needed for anaphylaxis. 1 Device 1   escitalopram  (LEXAPRO ) 10 MG tablet TAKE 1 TABLET BY MOUTH EVERY DAY 90 tablet 1   gabapentin  (NEURONTIN ) 100 MG capsule TAKE ONE CAPSULE BY MOUTH AT BEDTIME (CAN TAKE UP TO THREE CAPSULES A DAY WHEN NEEDED) 90 capsule 1   levothyroxine  (SYNTHROID ) 50 MCG tablet Take 1  tablet (50 mcg total) by mouth daily. 90 tablet 1   metoprolol  tartrate (LOPRESSOR ) 50 MG tablet Take 1 tablet (50 mg total) by mouth 2 (two) times daily. 180 tablet 1   mirtazapine  (REMERON ) 15 MG tablet Take 1 tablet (15 mg total) by mouth at bedtime. 90 tablet 3   pantoprazole  (PROTONIX ) 40 MG tablet TAKE 1 TABLET BY MOUTH DAILY - EMERGENCY REFILL FAXED DR. 30 tablet 1   escitalopram  (LEXAPRO ) 10 MG tablet Take 1 tablet (10 mg total) by mouth every morning. 90 tablet 3   neomycin -bacitracin -polymyxin (NEOSPORIN) ophthalmic ointment Place 1 Application into the right eye 4 (four) times daily. (Patient not taking: Reported on 02/12/2024) 3.5 g 0   potassium chloride  SA (KLOR-CON  M) 20 MEQ tablet Take 1 tablet (20 mEq total) by mouth daily. (Patient not taking: Reported on 02/12/2024) 90 tablet 0   simvastatin  (ZOCOR ) 40 MG tablet TAKE 1 TABLET BY MOUTH AT BEDTIME (Patient not taking: Reported on 02/12/2024) 90 tablet 1   Current Facility-Administered Medications on File Prior to Visit  Medication Dose Route Frequency Provider Last Rate Last Admin   omalizumab  (XOLAIR ) injection 150 mg  150 mg Subcutaneous Q28 days Luke Orlan HERO, DO  150 mg at 09/09/20 1019   Allergies  Allergen Reactions   Celexa [Citalopram Hydrobromide] Hives   Social History   Socioeconomic History   Marital status: Widowed    Spouse name: Not on file   Number of children: 2   Years of education: Not on file   Highest education level: Not on file  Occupational History   Not on file  Tobacco Use   Smoking status: Former    Current packs/day: 0.00    Types: Cigarettes    Start date: 62    Quit date: 2004    Years since quitting: 21.9   Smokeless tobacco: Never  Vaping Use   Vaping status: Never Used  Substance and Sexual Activity   Alcohol use: Not Currently    Comment: wine occassionally   Drug use: No   Sexual activity: Not Currently  Other Topics Concern   Not on file  Social History Narrative    Daughter is primary caregiver    Worked at [employer] as conservation officer, nature    Social Drivers of Health   Financial Resource Strain: Low Risk (08/04/2023)   Received from [facility]   Overall Financial Resource Strain (CARDIA)    Difficulty of Paying Living Expenses: Not hard at all  Food Insecurity: No Food Insecurity (08/04/2023)   Received from [facility]   Hunger Vital Sign    Within the past 12 months, you worried that your food would run out before you got the money to buy more.: Never true    Within the past 12 months, the food you bought just didn't last and you didn't have money to get more.: Never true  Transportation Needs: No Transportation Needs (08/04/2023)   Received from [facility]   PRAPARE - Transportation    Lack of Transportation (Medical): No    Lack of Transportation (Non-Medical): No  Physical Activity: Inactive (08/04/2023)   Received from [facility]   Exercise Vital Sign    On average, how many days per week do you engage in moderate to strenuous exercise (like a brisk walk)?: 0 days    On average, how many minutes do you engage in exercise at this level?: 0 min  Stress: No Stress Concern Present (08/04/2023)   Received from [facility] of Occupational Health - Occupational Stress Questionnaire    Feeling of Stress : Not at all  Social Connections: Moderately Isolated (08/04/2023)   Received from [facility]   Social Connection and Isolation Panel    In a typical week, how many times do you talk on the phone with family, friends, or neighbors?: Once a week    How often do you get together with friends or relatives?: Once a week    How often do you attend church or religious services?: 1 to 4 times per year    Do you belong to any clubs or organizations such as church groups, unions, fraternal or athletic groups, or school groups?: Yes    How often do you attend meetings of the clubs or organizations you belong to?: 1 to  4 times per year    Are you married, widowed, divorced, separated, never married, or living with a partner?: Widowed  Intimate Partner Violence: Not At Risk (08/04/2023)   Received from [facility]   Humiliation, Afraid, Rape, and Kick questionnaire    Within the last year, have you been afraid of your partner or ex-partner?: No  Within the last year, have you been humiliated or emotionally abused in other ways by your partner or ex-partner?: No    Within the last year, have you been kicked, hit, slapped, or otherwise physically hurt by your partner or ex-partner?: No    Within the last year, have you been raped or forced to have any kind of sexual activity by your partner or ex-partner?: No     Review of Systems  All other systems reviewed and are negative.      Objective:   Physical Exam Vitals reviewed.  Constitutional:      Appearance: Normal appearance.  Cardiovascular:     Rate and Rhythm: Normal rate and regular rhythm.     Heart sounds: Normal heart sounds. No murmur heard.    No friction rub. No gallop.  Pulmonary:     Effort: Pulmonary effort is normal. No respiratory distress.     Breath sounds: Normal breath sounds. No wheezing, rhonchi or rales.  Abdominal:     General: Bowel sounds are normal. There is no distension.     Palpations: Abdomen is soft.     Tenderness: There is no abdominal tenderness. There is no guarding or rebound.  Musculoskeletal:     Right lower leg: Edema present.     Left lower leg: Edema present.  Neurological:     Mental Status: She is alert.           Assessment & Plan:  Acquired hypothyroidism - Plan: levothyroxine  (SYNTHROID ) 25 MCG tablet  Leg swelling Recommended discontinuation of amlodipine .  Patient had an echocardiogram in 2023 that showed no evidence of congestive heart failure or similar problem.  Recommend replacing with hydrochlorothiazide  12.5 mg daily and rechecking sodium, potassium, and renal function in [redacted]  weeks along with her blood pressure.  At that time we can also get a TSH to monitor her hypothyroidism

## 2024-02-26 ENCOUNTER — Other Ambulatory Visit

## 2024-02-28 ENCOUNTER — Other Ambulatory Visit

## 2024-02-29 ENCOUNTER — Ambulatory Visit: Payer: Self-pay | Admitting: Family Medicine

## 2024-02-29 LAB — CBC WITH DIFFERENTIAL/PLATELET
Absolute Lymphocytes: 1663 {cells}/uL (ref 850–3900)
Absolute Monocytes: 470 {cells}/uL (ref 200–950)
Basophils Absolute: 0 {cells}/uL (ref 0–200)
Basophils Relative: 0 %
Eosinophils Absolute: 280 {cells}/uL (ref 15–500)
Eosinophils Relative: 5 %
HCT: 45.1 % (ref 35.9–46.0)
Hemoglobin: 14.3 g/dL (ref 11.7–15.5)
MCH: 29.3 pg (ref 27.0–33.0)
MCHC: 31.7 g/dL (ref 31.6–35.4)
MCV: 92.4 fL (ref 81.4–101.7)
MPV: 9.7 fL (ref 7.5–12.5)
Monocytes Relative: 8.4 %
Neutro Abs: 3186 {cells}/uL (ref 1500–7800)
Neutrophils Relative %: 56.9 %
Platelets: 449 Thousand/uL — ABNORMAL HIGH (ref 140–400)
RBC: 4.88 Million/uL (ref 3.80–5.10)
RDW: 14.9 % (ref 11.0–15.0)
Total Lymphocyte: 29.7 %
WBC: 5.6 Thousand/uL (ref 3.8–10.8)

## 2024-02-29 LAB — COMPLETE METABOLIC PANEL WITHOUT GFR
AG Ratio: 1.7 (calc) (ref 1.0–2.5)
ALT: 10 U/L (ref 6–29)
AST: 20 U/L (ref 10–35)
Albumin: 4.4 g/dL (ref 3.6–5.1)
Alkaline phosphatase (APISO): 88 U/L (ref 37–153)
BUN/Creatinine Ratio: 7 (calc) (ref 6–22)
BUN: 7 mg/dL (ref 7–25)
CO2: 31 mmol/L (ref 20–32)
Calcium: 9.6 mg/dL (ref 8.6–10.4)
Chloride: 99 mmol/L (ref 98–110)
Creat: 0.99 mg/dL — ABNORMAL HIGH (ref 0.60–0.95)
Globulin: 2.6 g/dL (ref 1.9–3.7)
Glucose, Bld: 111 mg/dL — ABNORMAL HIGH (ref 65–99)
Potassium: 4.1 mmol/L (ref 3.5–5.3)
Sodium: 141 mmol/L (ref 135–146)
Total Bilirubin: 0.6 mg/dL (ref 0.2–1.2)
Total Protein: 7 g/dL (ref 6.1–8.1)

## 2024-02-29 LAB — LIPID PANEL
Cholesterol: 283 mg/dL — ABNORMAL HIGH (ref <200)
HDL: 77 mg/dL (ref >=50)
LDL Cholesterol (Calc): 177 mg/dL — ABNORMAL HIGH
Non-HDL Cholesterol (Calc): 206 mg/dL — ABNORMAL HIGH (ref <130)
Total CHOL/HDL Ratio: 3.7 (calc) (ref <5.0)
Triglycerides: 149 mg/dL (ref <150)

## 2024-02-29 LAB — TSH: TSH: 11.75 m[IU]/L — ABNORMAL HIGH (ref 0.40–4.50)

## 2024-03-01 ENCOUNTER — Other Ambulatory Visit: Payer: Self-pay

## 2024-03-01 DIAGNOSIS — E039 Hypothyroidism, unspecified: Secondary | ICD-10-CM

## 2024-03-01 MED ORDER — LEVOTHYROXINE SODIUM 88 MCG PO TABS: 88.0000 ug | ORAL_TABLET | Freq: Every day | ORAL | 3 refills | Status: AC

## 2024-04-04 ENCOUNTER — Other Ambulatory Visit: Payer: Self-pay | Admitting: Family Medicine

## 2024-04-08 ENCOUNTER — Other Ambulatory Visit: Payer: Self-pay | Admitting: Family Medicine

## 2024-04-08 DIAGNOSIS — M792 Neuralgia and neuritis, unspecified: Secondary | ICD-10-CM
